# Patient Record
Sex: Male | Born: 1935 | Race: White | Hispanic: No | Marital: Married | Smoking: Former smoker
Health system: Southern US, Community
[De-identification: ages and names within clinical notes are randomized; demographics above are authoritative.]

## PROBLEM LIST (undated history)

## (undated) DIAGNOSIS — E119 Type 2 diabetes mellitus without complications: Secondary | ICD-10-CM

## (undated) DIAGNOSIS — M4800 Spinal stenosis, site unspecified: Secondary | ICD-10-CM

## (undated) DIAGNOSIS — I10 Essential (primary) hypertension: Secondary | ICD-10-CM

## (undated) DIAGNOSIS — I2510 Atherosclerotic heart disease of native coronary artery without angina pectoris: Secondary | ICD-10-CM

## (undated) DIAGNOSIS — K279 Peptic ulcer, site unspecified, unspecified as acute or chronic, without hemorrhage or perforation: Secondary | ICD-10-CM

## (undated) DIAGNOSIS — E785 Hyperlipidemia, unspecified: Secondary | ICD-10-CM

## (undated) DIAGNOSIS — E669 Obesity, unspecified: Secondary | ICD-10-CM

## (undated) DIAGNOSIS — G4730 Sleep apnea, unspecified: Secondary | ICD-10-CM

## (undated) DIAGNOSIS — H3530 Unspecified macular degeneration: Secondary | ICD-10-CM

## (undated) DIAGNOSIS — I480 Paroxysmal atrial fibrillation: Secondary | ICD-10-CM

## (undated) HISTORY — DX: Paroxysmal atrial fibrillation: I48.0

## (undated) HISTORY — DX: Atherosclerotic heart disease of native coronary artery without angina pectoris: I25.10

## (undated) HISTORY — DX: Spinal stenosis, site unspecified: M48.00

## (undated) HISTORY — PX: CARDIAC CATHETERIZATION: SHX172

## (undated) HISTORY — DX: Essential (primary) hypertension: I10

## (undated) HISTORY — PX: TURP VAPORIZATION: SUR1397

## (undated) HISTORY — DX: Hyperlipidemia, unspecified: E78.5

## (undated) HISTORY — DX: Sleep apnea, unspecified: G47.30

## (undated) HISTORY — DX: Type 2 diabetes mellitus without complications: E11.9

## (undated) HISTORY — PX: OTHER SURGICAL HISTORY: SHX169

## (undated) HISTORY — DX: Peptic ulcer, site unspecified, unspecified as acute or chronic, without hemorrhage or perforation: K27.9

## (undated) HISTORY — DX: Unspecified macular degeneration: H35.30

## (undated) HISTORY — DX: Obesity, unspecified: E66.9

---

## 1999-09-27 ENCOUNTER — Ambulatory Visit (HOSPITAL_BASED_OUTPATIENT_CLINIC_OR_DEPARTMENT_OTHER): Admission: RE | Admit: 1999-09-27 | Discharge: 1999-09-27 | Payer: Self-pay | Admitting: Geriatric Medicine

## 2000-04-27 ENCOUNTER — Inpatient Hospital Stay (HOSPITAL_COMMUNITY): Admission: EM | Admit: 2000-04-27 | Discharge: 2000-04-28 | Payer: Self-pay | Admitting: Emergency Medicine

## 2000-05-22 ENCOUNTER — Ambulatory Visit (HOSPITAL_COMMUNITY): Admission: RE | Admit: 2000-05-22 | Discharge: 2000-05-23 | Payer: Self-pay | Admitting: Cardiology

## 2000-08-27 ENCOUNTER — Encounter: Admission: RE | Admit: 2000-08-27 | Discharge: 2000-08-27 | Payer: Self-pay | Admitting: Geriatric Medicine

## 2000-08-27 ENCOUNTER — Encounter: Payer: Self-pay | Admitting: Geriatric Medicine

## 2000-10-02 ENCOUNTER — Ambulatory Visit (HOSPITAL_COMMUNITY): Admission: RE | Admit: 2000-10-02 | Discharge: 2000-10-02 | Payer: Self-pay | Admitting: *Deleted

## 2000-10-02 ENCOUNTER — Encounter (INDEPENDENT_AMBULATORY_CARE_PROVIDER_SITE_OTHER): Payer: Self-pay | Admitting: Specialist

## 2000-10-28 ENCOUNTER — Encounter: Payer: Self-pay | Admitting: Orthopaedic Surgery

## 2000-10-28 ENCOUNTER — Encounter: Admission: RE | Admit: 2000-10-28 | Discharge: 2000-10-28 | Payer: Self-pay | Admitting: Orthopaedic Surgery

## 2000-11-12 ENCOUNTER — Encounter: Admission: RE | Admit: 2000-11-12 | Discharge: 2000-12-03 | Payer: Self-pay | Admitting: Orthopaedic Surgery

## 2000-11-20 ENCOUNTER — Ambulatory Visit (HOSPITAL_COMMUNITY): Admission: RE | Admit: 2000-11-20 | Discharge: 2000-11-20 | Payer: Self-pay | Admitting: Orthopaedic Surgery

## 2000-11-20 ENCOUNTER — Encounter: Payer: Self-pay | Admitting: Orthopaedic Surgery

## 2000-11-20 ENCOUNTER — Encounter: Admission: RE | Admit: 2000-11-20 | Discharge: 2000-11-20 | Payer: Self-pay | Admitting: Orthopaedic Surgery

## 2000-12-04 ENCOUNTER — Encounter: Admission: RE | Admit: 2000-12-04 | Discharge: 2000-12-04 | Payer: Self-pay | Admitting: Orthopaedic Surgery

## 2000-12-04 ENCOUNTER — Ambulatory Visit (HOSPITAL_COMMUNITY): Admission: RE | Admit: 2000-12-04 | Discharge: 2000-12-04 | Payer: Self-pay | Admitting: Orthopaedic Surgery

## 2000-12-04 ENCOUNTER — Encounter: Payer: Self-pay | Admitting: Orthopaedic Surgery

## 2000-12-18 ENCOUNTER — Ambulatory Visit (HOSPITAL_COMMUNITY): Admission: RE | Admit: 2000-12-18 | Discharge: 2000-12-18 | Payer: Self-pay | Admitting: Orthopaedic Surgery

## 2000-12-18 ENCOUNTER — Encounter: Admission: RE | Admit: 2000-12-18 | Discharge: 2000-12-18 | Payer: Self-pay | Admitting: Orthopaedic Surgery

## 2000-12-18 ENCOUNTER — Encounter: Payer: Self-pay | Admitting: Orthopaedic Surgery

## 2002-02-25 ENCOUNTER — Ambulatory Visit (HOSPITAL_COMMUNITY): Admission: RE | Admit: 2002-02-25 | Discharge: 2002-02-25 | Payer: Self-pay | Admitting: Cardiology

## 2002-03-01 ENCOUNTER — Encounter: Admission: RE | Admit: 2002-03-01 | Discharge: 2002-03-01 | Payer: Self-pay | Admitting: Orthopedic Surgery

## 2002-03-02 ENCOUNTER — Ambulatory Visit (HOSPITAL_BASED_OUTPATIENT_CLINIC_OR_DEPARTMENT_OTHER): Admission: RE | Admit: 2002-03-02 | Discharge: 2002-03-02 | Payer: Self-pay | Admitting: Orthopedic Surgery

## 2002-03-25 ENCOUNTER — Ambulatory Visit (HOSPITAL_BASED_OUTPATIENT_CLINIC_OR_DEPARTMENT_OTHER): Admission: RE | Admit: 2002-03-25 | Discharge: 2002-03-25 | Payer: Self-pay | Admitting: Orthopedic Surgery

## 2003-03-22 ENCOUNTER — Encounter: Admission: RE | Admit: 2003-03-22 | Discharge: 2003-03-22 | Payer: Self-pay | Admitting: Geriatric Medicine

## 2003-03-29 ENCOUNTER — Ambulatory Visit (HOSPITAL_COMMUNITY): Admission: RE | Admit: 2003-03-29 | Discharge: 2003-03-29 | Payer: Self-pay | Admitting: Geriatric Medicine

## 2003-04-18 ENCOUNTER — Encounter: Admission: RE | Admit: 2003-04-18 | Discharge: 2003-04-18 | Payer: Self-pay | Admitting: Otolaryngology

## 2003-04-19 ENCOUNTER — Encounter (INDEPENDENT_AMBULATORY_CARE_PROVIDER_SITE_OTHER): Payer: Self-pay | Admitting: Specialist

## 2003-04-19 ENCOUNTER — Ambulatory Visit (HOSPITAL_COMMUNITY): Admission: RE | Admit: 2003-04-19 | Discharge: 2003-04-20 | Payer: Self-pay | Admitting: Otolaryngology

## 2003-05-25 ENCOUNTER — Inpatient Hospital Stay (HOSPITAL_COMMUNITY): Admission: EM | Admit: 2003-05-25 | Discharge: 2003-05-30 | Payer: Self-pay | Admitting: Emergency Medicine

## 2003-06-01 ENCOUNTER — Ambulatory Visit (HOSPITAL_COMMUNITY): Admission: RE | Admit: 2003-06-01 | Discharge: 2003-06-02 | Payer: Self-pay | Admitting: Cardiology

## 2003-06-09 ENCOUNTER — Inpatient Hospital Stay (HOSPITAL_COMMUNITY): Admission: RE | Admit: 2003-06-09 | Discharge: 2003-06-10 | Payer: Self-pay | Admitting: Cardiology

## 2004-05-01 ENCOUNTER — Ambulatory Visit: Payer: Self-pay | Admitting: Cardiology

## 2004-05-15 ENCOUNTER — Encounter (INDEPENDENT_AMBULATORY_CARE_PROVIDER_SITE_OTHER): Payer: Self-pay | Admitting: Specialist

## 2004-05-15 ENCOUNTER — Ambulatory Visit (HOSPITAL_COMMUNITY): Admission: RE | Admit: 2004-05-15 | Discharge: 2004-05-15 | Payer: Self-pay | Admitting: *Deleted

## 2004-06-11 ENCOUNTER — Ambulatory Visit: Payer: Self-pay | Admitting: Cardiology

## 2004-06-19 ENCOUNTER — Ambulatory Visit: Payer: Self-pay | Admitting: Cardiology

## 2004-07-03 ENCOUNTER — Ambulatory Visit: Payer: Self-pay | Admitting: Cardiology

## 2004-07-17 ENCOUNTER — Ambulatory Visit: Payer: Self-pay | Admitting: Cardiology

## 2004-08-14 ENCOUNTER — Ambulatory Visit: Payer: Self-pay | Admitting: Cardiovascular Disease

## 2004-08-21 ENCOUNTER — Ambulatory Visit: Payer: Self-pay | Admitting: *Deleted

## 2004-09-04 ENCOUNTER — Ambulatory Visit: Payer: Self-pay | Admitting: Cardiology

## 2004-09-18 ENCOUNTER — Ambulatory Visit: Payer: Self-pay | Admitting: Cardiology

## 2004-10-02 ENCOUNTER — Ambulatory Visit: Payer: Self-pay | Admitting: Cardiovascular Disease

## 2004-10-10 ENCOUNTER — Encounter: Admission: RE | Admit: 2004-10-10 | Discharge: 2004-10-10 | Payer: Self-pay | Admitting: Geriatric Medicine

## 2004-10-18 ENCOUNTER — Encounter: Admission: RE | Admit: 2004-10-18 | Discharge: 2004-10-18 | Payer: Self-pay | Admitting: Geriatric Medicine

## 2004-10-30 ENCOUNTER — Ambulatory Visit: Payer: Self-pay | Admitting: Cardiovascular Disease

## 2004-11-26 ENCOUNTER — Encounter: Admission: RE | Admit: 2004-11-26 | Discharge: 2004-11-26 | Payer: Self-pay | Admitting: Geriatric Medicine

## 2004-11-26 ENCOUNTER — Ambulatory Visit: Payer: Self-pay | Admitting: Cardiology

## 2004-12-04 ENCOUNTER — Ambulatory Visit: Payer: Self-pay | Admitting: Cardiology

## 2004-12-11 ENCOUNTER — Ambulatory Visit: Payer: Self-pay | Admitting: Cardiology

## 2005-01-15 ENCOUNTER — Encounter: Admission: RE | Admit: 2005-01-15 | Discharge: 2005-01-15 | Payer: Self-pay | Admitting: Geriatric Medicine

## 2005-01-29 ENCOUNTER — Ambulatory Visit: Payer: Self-pay

## 2005-02-12 ENCOUNTER — Ambulatory Visit: Payer: Self-pay | Admitting: Cardiology

## 2005-03-05 ENCOUNTER — Ambulatory Visit: Payer: Self-pay | Admitting: Cardiology

## 2005-04-02 ENCOUNTER — Ambulatory Visit: Payer: Self-pay | Admitting: Cardiology

## 2005-04-28 ENCOUNTER — Ambulatory Visit: Payer: Self-pay | Admitting: Cardiology

## 2005-04-30 ENCOUNTER — Encounter: Admission: RE | Admit: 2005-04-30 | Discharge: 2005-04-30 | Payer: Self-pay | Admitting: Geriatric Medicine

## 2005-05-07 ENCOUNTER — Ambulatory Visit: Payer: Self-pay | Admitting: Cardiology

## 2005-05-14 ENCOUNTER — Encounter: Admission: RE | Admit: 2005-05-14 | Discharge: 2005-05-14 | Payer: Self-pay | Admitting: Geriatric Medicine

## 2005-05-14 ENCOUNTER — Ambulatory Visit: Payer: Self-pay | Admitting: Cardiology

## 2005-05-22 ENCOUNTER — Ambulatory Visit: Payer: Self-pay | Admitting: Cardiology

## 2005-06-11 ENCOUNTER — Ambulatory Visit: Payer: Self-pay | Admitting: Cardiology

## 2005-07-09 ENCOUNTER — Ambulatory Visit: Payer: Self-pay | Admitting: Internal Medicine

## 2005-07-30 ENCOUNTER — Ambulatory Visit: Payer: Self-pay | Admitting: Internal Medicine

## 2005-08-08 ENCOUNTER — Ambulatory Visit: Payer: Self-pay | Admitting: Cardiology

## 2005-08-15 ENCOUNTER — Ambulatory Visit: Payer: Self-pay | Admitting: Cardiology

## 2005-08-27 ENCOUNTER — Ambulatory Visit: Payer: Self-pay | Admitting: Cardiovascular Disease

## 2005-09-12 ENCOUNTER — Ambulatory Visit: Payer: Self-pay

## 2005-09-26 ENCOUNTER — Ambulatory Visit: Payer: Self-pay | Admitting: Internal Medicine

## 2005-10-17 ENCOUNTER — Ambulatory Visit: Payer: Self-pay | Admitting: Internal Medicine

## 2005-11-13 ENCOUNTER — Ambulatory Visit: Payer: Self-pay | Admitting: Cardiology

## 2005-12-03 ENCOUNTER — Ambulatory Visit: Payer: Self-pay | Admitting: Cardiology

## 2005-12-31 ENCOUNTER — Ambulatory Visit: Payer: Self-pay | Admitting: Cardiology

## 2006-01-14 ENCOUNTER — Ambulatory Visit: Payer: Self-pay | Admitting: Cardiology

## 2006-01-27 ENCOUNTER — Ambulatory Visit: Payer: Self-pay | Admitting: Internal Medicine

## 2006-02-19 ENCOUNTER — Ambulatory Visit: Payer: Self-pay | Admitting: Cardiology

## 2006-02-25 ENCOUNTER — Ambulatory Visit: Payer: Self-pay | Admitting: Cardiology

## 2006-03-12 ENCOUNTER — Ambulatory Visit: Payer: Self-pay | Admitting: Cardiology

## 2006-04-13 ENCOUNTER — Ambulatory Visit: Payer: Self-pay | Admitting: Cardiology

## 2006-05-04 ENCOUNTER — Ambulatory Visit: Payer: Self-pay | Admitting: Cardiovascular Disease

## 2006-05-12 ENCOUNTER — Ambulatory Visit: Payer: Self-pay | Admitting: Cardiology

## 2006-05-18 ENCOUNTER — Ambulatory Visit: Payer: Self-pay | Admitting: Internal Medicine

## 2006-06-15 ENCOUNTER — Ambulatory Visit: Payer: Self-pay | Admitting: Cardiovascular Disease

## 2006-06-29 ENCOUNTER — Ambulatory Visit: Payer: Self-pay | Admitting: Cardiovascular Disease

## 2006-07-06 ENCOUNTER — Encounter: Admission: RE | Admit: 2006-07-06 | Discharge: 2006-07-06 | Payer: Self-pay | Admitting: Geriatric Medicine

## 2006-07-13 ENCOUNTER — Ambulatory Visit: Payer: Self-pay | Admitting: Cardiology

## 2006-08-05 ENCOUNTER — Ambulatory Visit: Payer: Self-pay | Admitting: Cardiology

## 2006-08-11 ENCOUNTER — Ambulatory Visit: Payer: Self-pay | Admitting: Cardiology

## 2006-08-12 ENCOUNTER — Ambulatory Visit: Payer: Self-pay

## 2006-08-12 ENCOUNTER — Ambulatory Visit: Payer: Self-pay | Admitting: Cardiology

## 2006-08-12 LAB — CONVERTED CEMR LAB
BUN: 12 mg/dL (ref 6–23)
Basophils Absolute: 0 10*3/uL (ref 0.0–0.1)
Basophils Relative: 0.5 % (ref 0.0–1.0)
CO2: 29 meq/L (ref 19–32)
Calcium: 9.4 mg/dL (ref 8.4–10.5)
Chloride: 107 meq/L (ref 96–112)
Cholesterol: 152 mg/dL (ref 0–200)
Creatinine, Ser: 1.1 mg/dL (ref 0.4–1.5)
Eosinophils Absolute: 0.2 10*3/uL (ref 0.0–0.6)
Eosinophils Relative: 3.6 % (ref 0.0–5.0)
GFR calc Af Amer: 83 mL/min
GFR calc non Af Amer: 69 mL/min
Glucose, Bld: 148 mg/dL — ABNORMAL HIGH (ref 70–99)
HCT: 44.7 % (ref 39.0–52.0)
HDL: 33.9 mg/dL — ABNORMAL LOW (ref >39.0)
Hemoglobin: 15.1 g/dL (ref 13.0–17.0)
Hgb A1c MFr Bld: 6.6 % — ABNORMAL HIGH (ref 4.6–6.0)
LDL Cholesterol: 78 mg/dL (ref 0–99)
Lymphocytes Relative: 22.8 % (ref 12.0–46.0)
MCHC: 33.8 g/dL (ref 30.0–36.0)
MCV: 89.1 fL (ref 78.0–100.0)
Monocytes Absolute: 0.6 10*3/uL (ref 0.2–0.7)
Monocytes Relative: 8.1 % (ref 3.0–11.0)
Neutro Abs: 4.4 10*3/uL (ref 1.4–7.7)
Neutrophils Relative %: 65 % (ref 43.0–77.0)
Platelets: 182 10*3/uL (ref 150–400)
Potassium: 3.8 meq/L (ref 3.5–5.1)
RBC: 5.02 M/uL (ref 4.22–5.81)
RDW: 14.4 % (ref 11.5–14.6)
Sodium: 143 meq/L (ref 135–145)
Total CHOL/HDL Ratio: 4.5
Triglycerides: 199 mg/dL — ABNORMAL HIGH (ref 0–149)
VLDL: 40 mg/dL (ref 0–40)
WBC: 6.8 10*3/uL (ref 4.5–10.5)

## 2006-08-18 ENCOUNTER — Ambulatory Visit: Payer: Self-pay | Admitting: Cardiology

## 2006-08-18 LAB — CONVERTED CEMR LAB
INR: 7.8 (ref 0.9–2.0)
Prothrombin Time: 36.6 s (ref 10.0–14.0)

## 2006-08-21 ENCOUNTER — Ambulatory Visit: Payer: Self-pay | Admitting: Cardiology

## 2006-08-28 ENCOUNTER — Ambulatory Visit: Payer: Self-pay | Admitting: Cardiovascular Disease

## 2006-08-28 LAB — CONVERTED CEMR LAB
INR: 7 (ref 0.9–2.0)
Prothrombin Time: 34.6 s (ref 10.0–14.0)

## 2006-09-01 ENCOUNTER — Ambulatory Visit: Payer: Self-pay | Admitting: Cardiovascular Disease

## 2006-09-07 ENCOUNTER — Ambulatory Visit: Payer: Self-pay | Admitting: Cardiology

## 2006-09-15 ENCOUNTER — Ambulatory Visit: Payer: Self-pay | Admitting: Cardiology

## 2006-09-21 ENCOUNTER — Ambulatory Visit: Payer: Self-pay | Admitting: Internal Medicine

## 2006-10-01 ENCOUNTER — Ambulatory Visit: Payer: Self-pay | Admitting: Internal Medicine

## 2006-10-09 ENCOUNTER — Ambulatory Visit: Payer: Self-pay | Admitting: Cardiology

## 2006-10-30 ENCOUNTER — Ambulatory Visit: Payer: Self-pay | Admitting: Cardiology

## 2006-11-27 ENCOUNTER — Ambulatory Visit: Payer: Self-pay | Admitting: Cardiovascular Disease

## 2006-12-25 ENCOUNTER — Ambulatory Visit: Payer: Self-pay | Admitting: Cardiovascular Disease

## 2007-01-08 ENCOUNTER — Ambulatory Visit: Payer: Self-pay | Admitting: Cardiology

## 2007-01-28 ENCOUNTER — Ambulatory Visit: Payer: Self-pay | Admitting: Cardiology

## 2007-02-18 ENCOUNTER — Ambulatory Visit: Payer: Self-pay | Admitting: Cardiovascular Disease

## 2007-03-18 ENCOUNTER — Ambulatory Visit: Payer: Self-pay | Admitting: Cardiology

## 2007-04-15 ENCOUNTER — Ambulatory Visit: Payer: Self-pay | Admitting: Cardiology

## 2007-05-13 ENCOUNTER — Ambulatory Visit: Payer: Self-pay | Admitting: Cardiovascular Disease

## 2007-06-10 ENCOUNTER — Ambulatory Visit: Payer: Self-pay | Admitting: Cardiology

## 2007-07-08 ENCOUNTER — Ambulatory Visit: Payer: Self-pay | Admitting: Internal Medicine

## 2007-07-22 ENCOUNTER — Ambulatory Visit: Payer: Self-pay | Admitting: Cardiology

## 2007-08-09 ENCOUNTER — Ambulatory Visit: Payer: Self-pay | Admitting: Cardiology

## 2007-08-19 ENCOUNTER — Ambulatory Visit: Payer: Self-pay

## 2007-08-19 ENCOUNTER — Ambulatory Visit: Payer: Self-pay | Admitting: Cardiology

## 2007-09-16 ENCOUNTER — Ambulatory Visit: Payer: Self-pay | Admitting: Cardiology

## 2007-09-30 ENCOUNTER — Ambulatory Visit: Payer: Self-pay | Admitting: Cardiology

## 2007-10-28 ENCOUNTER — Ambulatory Visit: Payer: Self-pay | Admitting: Cardiology

## 2007-11-11 ENCOUNTER — Ambulatory Visit: Payer: Self-pay | Admitting: Cardiology

## 2007-12-09 ENCOUNTER — Ambulatory Visit: Payer: Self-pay | Admitting: Cardiology

## 2007-12-09 LAB — CONVERTED CEMR LAB
ALT: 34 units/L (ref 0–53)
AST: 41 units/L — ABNORMAL HIGH (ref 0–37)
Albumin: 3.8 g/dL (ref 3.5–5.2)
Alkaline Phosphatase: 18 units/L — ABNORMAL LOW (ref 39–117)
Bilirubin, Direct: 0.1 mg/dL (ref 0.0–0.3)
Total Bilirubin: 1 mg/dL (ref 0.3–1.2)
Total Protein: 7.2 g/dL (ref 6.0–8.3)

## 2008-01-06 ENCOUNTER — Ambulatory Visit: Payer: Self-pay | Admitting: Internal Medicine

## 2008-02-04 ENCOUNTER — Ambulatory Visit: Payer: Self-pay | Admitting: Cardiology

## 2008-03-06 ENCOUNTER — Ambulatory Visit: Payer: Self-pay | Admitting: Cardiovascular Disease

## 2008-03-30 ENCOUNTER — Ambulatory Visit: Payer: Self-pay | Admitting: Internal Medicine

## 2008-04-27 ENCOUNTER — Ambulatory Visit: Payer: Self-pay | Admitting: Cardiovascular Disease

## 2008-05-25 ENCOUNTER — Ambulatory Visit: Payer: Self-pay | Admitting: Internal Medicine

## 2008-06-22 ENCOUNTER — Ambulatory Visit: Payer: Self-pay | Admitting: Cardiology

## 2008-07-06 ENCOUNTER — Ambulatory Visit: Payer: Self-pay | Admitting: Cardiology

## 2008-08-03 ENCOUNTER — Ambulatory Visit: Payer: Self-pay | Admitting: Internal Medicine

## 2008-08-03 LAB — CONVERTED CEMR LAB
POC INR: 2.2
Protime: 18.1

## 2008-08-08 ENCOUNTER — Emergency Department (HOSPITAL_COMMUNITY): Admission: EM | Admit: 2008-08-08 | Discharge: 2008-08-08 | Payer: Self-pay | Admitting: Emergency Medicine

## 2008-08-09 ENCOUNTER — Encounter: Payer: Self-pay | Admitting: *Deleted

## 2008-08-14 DIAGNOSIS — E785 Hyperlipidemia, unspecified: Secondary | ICD-10-CM

## 2008-08-14 DIAGNOSIS — M109 Gout, unspecified: Secondary | ICD-10-CM

## 2008-08-14 DIAGNOSIS — M4800 Spinal stenosis, site unspecified: Secondary | ICD-10-CM

## 2008-08-14 DIAGNOSIS — I4891 Unspecified atrial fibrillation: Secondary | ICD-10-CM | POA: Insufficient documentation

## 2008-08-14 DIAGNOSIS — G4730 Sleep apnea, unspecified: Secondary | ICD-10-CM

## 2008-08-14 DIAGNOSIS — E119 Type 2 diabetes mellitus without complications: Secondary | ICD-10-CM | POA: Insufficient documentation

## 2008-08-14 DIAGNOSIS — M810 Age-related osteoporosis without current pathological fracture: Secondary | ICD-10-CM | POA: Insufficient documentation

## 2008-08-14 DIAGNOSIS — H409 Unspecified glaucoma: Secondary | ICD-10-CM | POA: Insufficient documentation

## 2008-08-14 DIAGNOSIS — I2510 Atherosclerotic heart disease of native coronary artery without angina pectoris: Secondary | ICD-10-CM

## 2008-08-14 DIAGNOSIS — K279 Peptic ulcer, site unspecified, unspecified as acute or chronic, without hemorrhage or perforation: Secondary | ICD-10-CM

## 2008-08-14 DIAGNOSIS — H3530 Unspecified macular degeneration: Secondary | ICD-10-CM | POA: Insufficient documentation

## 2008-08-14 DIAGNOSIS — E669 Obesity, unspecified: Secondary | ICD-10-CM | POA: Insufficient documentation

## 2008-08-14 DIAGNOSIS — I10 Essential (primary) hypertension: Secondary | ICD-10-CM

## 2008-08-15 ENCOUNTER — Ambulatory Visit: Payer: Self-pay | Admitting: Cardiology

## 2008-08-16 ENCOUNTER — Encounter: Payer: Self-pay | Admitting: Cardiology

## 2008-08-16 ENCOUNTER — Telehealth: Payer: Self-pay | Admitting: Cardiology

## 2008-08-16 ENCOUNTER — Telehealth (INDEPENDENT_AMBULATORY_CARE_PROVIDER_SITE_OTHER): Payer: Self-pay | Admitting: *Deleted

## 2008-08-17 ENCOUNTER — Telehealth (INDEPENDENT_AMBULATORY_CARE_PROVIDER_SITE_OTHER): Payer: Self-pay | Admitting: *Deleted

## 2008-08-21 ENCOUNTER — Telehealth (INDEPENDENT_AMBULATORY_CARE_PROVIDER_SITE_OTHER): Payer: Self-pay | Admitting: *Deleted

## 2008-08-22 ENCOUNTER — Encounter: Payer: Self-pay | Admitting: Cardiology

## 2008-08-22 ENCOUNTER — Ambulatory Visit: Payer: Self-pay

## 2008-08-31 ENCOUNTER — Inpatient Hospital Stay (HOSPITAL_COMMUNITY): Admission: RE | Admit: 2008-08-31 | Discharge: 2008-09-01 | Payer: Self-pay | Admitting: Orthopedic Surgery

## 2008-09-13 ENCOUNTER — Encounter: Payer: Self-pay | Admitting: *Deleted

## 2008-10-20 ENCOUNTER — Encounter: Payer: Self-pay | Admitting: Cardiology

## 2008-10-23 ENCOUNTER — Encounter (INDEPENDENT_AMBULATORY_CARE_PROVIDER_SITE_OTHER): Payer: Self-pay | Admitting: *Deleted

## 2008-10-25 ENCOUNTER — Ambulatory Visit: Payer: Self-pay | Admitting: Internal Medicine

## 2008-10-25 LAB — CONVERTED CEMR LAB: POC INR: 3.1

## 2008-11-21 ENCOUNTER — Encounter: Payer: Self-pay | Admitting: Cardiology

## 2008-11-22 ENCOUNTER — Ambulatory Visit: Payer: Self-pay | Admitting: Cardiology

## 2008-11-22 LAB — CONVERTED CEMR LAB: POC INR: 3.7

## 2008-12-06 ENCOUNTER — Ambulatory Visit: Payer: Self-pay | Admitting: Cardiology

## 2008-12-06 ENCOUNTER — Telehealth (INDEPENDENT_AMBULATORY_CARE_PROVIDER_SITE_OTHER): Payer: Self-pay | Admitting: *Deleted

## 2008-12-06 LAB — CONVERTED CEMR LAB: POC INR: 3.3

## 2008-12-20 ENCOUNTER — Ambulatory Visit: Payer: Self-pay | Admitting: Cardiology

## 2008-12-20 LAB — CONVERTED CEMR LAB: POC INR: 2.6

## 2009-01-10 ENCOUNTER — Ambulatory Visit: Payer: Self-pay | Admitting: Cardiovascular Disease

## 2009-01-10 LAB — CONVERTED CEMR LAB: POC INR: 2.3

## 2009-02-07 ENCOUNTER — Ambulatory Visit: Payer: Self-pay | Admitting: Cardiology

## 2009-02-07 ENCOUNTER — Encounter (INDEPENDENT_AMBULATORY_CARE_PROVIDER_SITE_OTHER): Payer: Self-pay | Admitting: Cardiology

## 2009-02-07 LAB — CONVERTED CEMR LAB: POC INR: 1.7

## 2009-02-14 ENCOUNTER — Ambulatory Visit: Payer: Self-pay | Admitting: Cardiology

## 2009-02-14 ENCOUNTER — Encounter (INDEPENDENT_AMBULATORY_CARE_PROVIDER_SITE_OTHER): Payer: Self-pay | Admitting: Cardiology

## 2009-02-14 LAB — CONVERTED CEMR LAB: POC INR: 1.9

## 2009-02-28 ENCOUNTER — Ambulatory Visit: Payer: Self-pay | Admitting: Cardiology

## 2009-02-28 LAB — CONVERTED CEMR LAB: POC INR: 2.2

## 2009-03-21 ENCOUNTER — Ambulatory Visit: Payer: Self-pay | Admitting: Internal Medicine

## 2009-03-21 LAB — CONVERTED CEMR LAB: POC INR: 2

## 2009-04-11 ENCOUNTER — Ambulatory Visit: Payer: Self-pay | Admitting: Cardiovascular Disease

## 2009-04-11 LAB — CONVERTED CEMR LAB: POC INR: 1.7

## 2009-05-02 ENCOUNTER — Ambulatory Visit: Payer: Self-pay | Admitting: Cardiology

## 2009-05-23 ENCOUNTER — Telehealth: Payer: Self-pay | Admitting: Cardiology

## 2009-05-23 ENCOUNTER — Ambulatory Visit: Payer: Self-pay | Admitting: Cardiology

## 2009-05-23 LAB — CONVERTED CEMR LAB: POC INR: 2.4

## 2009-05-28 ENCOUNTER — Encounter: Payer: Self-pay | Admitting: Cardiology

## 2009-06-06 ENCOUNTER — Encounter: Payer: Self-pay | Admitting: Cardiology

## 2009-06-15 ENCOUNTER — Telehealth (INDEPENDENT_AMBULATORY_CARE_PROVIDER_SITE_OTHER): Payer: Self-pay | Admitting: *Deleted

## 2009-06-20 ENCOUNTER — Ambulatory Visit: Payer: Self-pay | Admitting: Cardiovascular Disease

## 2009-06-20 LAB — CONVERTED CEMR LAB: POC INR: 1.7

## 2009-07-11 ENCOUNTER — Ambulatory Visit: Payer: Self-pay | Admitting: Cardiovascular Disease

## 2009-07-11 LAB — CONVERTED CEMR LAB: POC INR: 2.6

## 2009-08-08 ENCOUNTER — Ambulatory Visit: Payer: Self-pay | Admitting: Cardiology

## 2009-08-08 LAB — CONVERTED CEMR LAB: POC INR: 2.2

## 2009-08-15 ENCOUNTER — Ambulatory Visit: Payer: Self-pay | Admitting: Cardiology

## 2009-08-15 DIAGNOSIS — I739 Peripheral vascular disease, unspecified: Secondary | ICD-10-CM

## 2009-08-27 ENCOUNTER — Ambulatory Visit: Payer: Self-pay

## 2009-08-27 ENCOUNTER — Encounter: Payer: Self-pay | Admitting: Cardiology

## 2009-09-05 ENCOUNTER — Ambulatory Visit: Payer: Self-pay | Admitting: Internal Medicine

## 2009-09-05 LAB — CONVERTED CEMR LAB: POC INR: 3.1

## 2009-09-24 ENCOUNTER — Telehealth: Payer: Self-pay | Admitting: Cardiology

## 2009-10-03 ENCOUNTER — Ambulatory Visit: Payer: Self-pay | Admitting: Cardiology

## 2009-10-03 LAB — CONVERTED CEMR LAB: POC INR: 4.6

## 2009-10-17 ENCOUNTER — Ambulatory Visit: Payer: Self-pay | Admitting: Cardiology

## 2009-10-17 LAB — CONVERTED CEMR LAB: POC INR: 2.5

## 2009-11-09 ENCOUNTER — Telehealth: Payer: Self-pay | Admitting: Cardiology

## 2009-11-09 ENCOUNTER — Ambulatory Visit: Payer: Self-pay | Admitting: Internal Medicine

## 2009-12-07 ENCOUNTER — Ambulatory Visit: Payer: Self-pay | Admitting: Internal Medicine

## 2009-12-07 LAB — CONVERTED CEMR LAB: POC INR: 1.9

## 2010-01-04 ENCOUNTER — Ambulatory Visit: Payer: Self-pay | Admitting: Internal Medicine

## 2010-01-25 ENCOUNTER — Ambulatory Visit: Payer: Self-pay | Admitting: Cardiovascular Disease

## 2010-02-15 ENCOUNTER — Ambulatory Visit: Payer: Self-pay | Admitting: Internal Medicine

## 2010-03-15 ENCOUNTER — Ambulatory Visit: Admission: RE | Admit: 2010-03-15 | Discharge: 2010-03-15 | Payer: Self-pay | Source: Home / Self Care

## 2010-03-15 LAB — CONVERTED CEMR LAB: INR: 2.2

## 2010-04-09 NOTE — Progress Notes (Signed)
Summary: Amlodipine refill  Phone Note Call from Patient   Caller: Patient Call For: Brodie Summary of Call: Pt seen in [facility] today.  Pt states he needs refill on Amlodipine 10mg  currently taking 1/2 tablet daily.  Needs sent to [facility] on [address].  Initial call taken by: Cloyde Reams RN,  November 09, 2009 8:38 AM     Appended Document: Amlodipine refill    Prescriptions: NORVASC 10 MG TABS (AMLODIPINE BESYLATE) Take 1 tablet by mouth once a day  #90 x 3   Entered by:   Burnett Kanaris, CNA   Authorized by:   Lenoria Farrier, MD, [facility]   Signed by:   Burnett Kanaris, CNA on 11/13/2009   Method used:   Faxed to ...       [facility] (retail)       [phone] [address]       [place], [place]  [address]       Ph: [phone]       Fax: [phone]   RxID:   [id number] NORVASC 10 MG TABS (AMLODIPINE BESYLATE) Take 1 tablet by mouth once a day  #90 x 3   Entered by:   Burnett Kanaris, CNA   Authorized by:   Lenoria Farrier, MD, [facility]   Signed by:   Burnett Kanaris, CNA on 11/13/2009   Method used:   Print then Give to Patient   RxID:   [id number]

## 2010-04-09 NOTE — Medication Information (Signed)
Summary: rov/ewj  Anticoagulant Therapy  Managed by: Cloyde Reams, RN, BSN Referring MD: Charlies Constable MD PCP: [name] Supervising MD: Eden Emms MD, Theron Arista Indication 1: Atrial Fibrillation (ICD-427.31) Lab Used: Primary school teacher Site: [address] INR POC 1.7 INR RANGE 2 - 3  Dietary changes: no    Health status changes: no    Bleeding/hemorrhagic complications: no    Recent/future hospitalizations: no    Any changes in medication regimen? no    Recent/future dental: no  Any missed doses?: yes     Details: Off coumadin on 3/28 x 5 days, resumed on 06/09/09.  Is patient compliant with meds? yes       Allergies: 1)  ! * Contrast Dye  Anticoagulation Management History:      The patient is taking warfarin and comes in today for a routine follow up visit.  Positive risk factors for bleeding include an age of 75 years or older and presence of serious comorbidities.  The bleeding index is 'intermediate risk'.  Positive CHADS2 values include History of HTN, Age > 20 years old, and History of Diabetes.  The start date was 04/28/2000.  His last INR was 7.0 RATIO.  Anticoagulation responsible provider: Eden Emms MD, Theron Arista.  INR POC: 1.7.  Cuvette Lot#: [id number].  Exp: 07/2010.    Anticoagulation Management Assessment/Plan:      The patient's current anticoagulation dose is Warfarin sodium 2.5 mg tabs: Take by mouth as directed.  The target INR is 2 - 3.  The next INR is due 07/11/2009.  Anticoagulation instructions were given to patient.  Results were reviewed/authorized by Cloyde Reams, RN, BSN.  He was notified by Cloyde Reams RN.         Prior Anticoagulation Instructions: INR 2.4  Continue on same dosage 2.5mg  daily.  Recheck in 4 weeks.    Current Anticoagulation Instructions: INR 1.7  Take 5mg  today, then resume same dosage 2.5mg  daily.  Recheck in 2-3 weeks.

## 2010-04-09 NOTE — Medication Information (Signed)
Summary: rov/tm  Anticoagulant Therapy  Managed by: Weston Brass, PharmD Referring MD: Charlies Constable MD PCP: [name] Supervising MD: Shirlee Latch MD, Dalton Indication 1: Atrial Fibrillation (ICD-427.31) Lab Used: Primary school teacher Site: [address] INR POC 4.6 INR RANGE 2 - 3  Dietary changes: yes       Details: less greens over past week   Health status changes: no    Bleeding/hemorrhagic complications: no    Recent/future hospitalizations: no    Any changes in medication regimen? no    Recent/future dental: no  Any missed doses?: no       Is patient compliant with meds? yes       Allergies: 1)  ! * Contrast Dye  Anticoagulation Management History:      The patient is taking warfarin and comes in today for a routine follow up visit.  Positive risk factors for bleeding include an age of 75 years or older and presence of serious comorbidities.  The bleeding index is 'intermediate risk'.  Positive CHADS2 values include History of HTN, Age > 75 years old, and History of Diabetes.  The start date was 04/28/2000.  His last INR was 7.0 RATIO.  Anticoagulation responsible provider: Shirlee Latch MD, Dalton.  INR POC: 4.6.  Cuvette Lot#: [id number].  Exp: 12/2010.    Anticoagulation Management Assessment/Plan:      The patient's current anticoagulation dose is Warfarin sodium 2.5 mg tabs: Take by mouth as directed.  The target INR is 2 - 3.  The next INR is due 10/17/2009.  Anticoagulation instructions were given to patient.  Results were reviewed/authorized by Weston Brass, PharmD.  He was notified by Weston Brass PharmD.         Prior Anticoagulation Instructions: INR 3.1 Skip Thursday's dose then resume 2.5mg s everyday. Recheck in 4 weeks.   Current Anticoagulation Instructions: INR- 4.6  Skip Thursday and Friday's dose of Coumadin then resume same dose of 1 tablet every day.

## 2010-04-09 NOTE — Medication Information (Signed)
Summary: rov/sp  Anticoagulant Therapy  Managed by: Reina Fuse, PharmD Referring MD: Charlies Constable MD PCP: [name] Supervising MD: Gala Romney MD, Reuel Boom Indication 1: Atrial Fibrillation (ICD-427.31) Lab Used: Primary school teacher Site: [address] INR POC 1.8 INR RANGE 2 - 3  Dietary changes: no    Health status changes: no    Bleeding/hemorrhagic complications: no    Recent/future hospitalizations: no    Any changes in medication regimen? no    Recent/future dental: no  Any missed doses?: no       Is patient compliant with meds? yes       Allergies: 1)  ! * Contrast Dye  Anticoagulation Management History:      The patient is taking warfarin and comes in today for a routine follow up visit.  Positive risk factors for bleeding include an age of 75 years or older and presence of serious comorbidities.  The bleeding index is 'intermediate risk'.  Positive CHADS2 values include History of HTN, Age > 8 years old, and History of Diabetes.  The start date was 04/28/2000.  His last INR was 7.0 RATIO.  Anticoagulation responsible provider: Bensimhon MD, Reuel Boom.  INR POC: 1.8.  Cuvette Lot#: [id number].  Exp: 01/2011.    Anticoagulation Management Assessment/Plan:      The patient's current anticoagulation dose is Warfarin sodium 2.5 mg tabs: Take by mouth as directed.  The target INR is 2 - 3.  The next INR is due 01/25/2010.  Anticoagulation instructions were given to patient.  Results were reviewed/authorized by Reina Fuse, PharmD.  He was notified by Reina Fuse PharmD.         Prior Anticoagulation Instructions: INR 1.9  Take an extra 1/2 tablet today then resume same dose of 1 tablet every day.  Recheck INR in 4 weeks.   Current Anticoagulation Instructions: INR 1.8  Take Coumadin 1 tab (2.5 mg) all days except Coumadin 1.5 tabs (3.75 mg) on Saturdays. Return to clinic in 3 weeks.

## 2010-04-09 NOTE — Medication Information (Signed)
Summary: rov/tm  Anticoagulant Therapy  Managed by: Weston Brass, PharmD, BCPS Referring MD: Charlies Constable MD PCP: [name] Supervising MD: Tenny Craw MD, Gunnar Fusi Indication 1: Atrial Fibrillation (ICD-427.31) Lab Used: Primary school teacher Site: [address] INR POC 2.8 INR RANGE 2 - 3  Dietary changes: no    Health status changes: no    Bleeding/hemorrhagic complications: no    Recent/future hospitalizations: no    Any changes in medication regimen? yes       Details: has been out of amlodipine for 3 days  Recent/future dental: yes     Details: Middle of october  Any missed doses?: no       Is patient compliant with meds? yes       Allergies (verified): 1)  ! * Contrast Dye  Anticoagulation Management History:      Positive risk factors for bleeding include an age of 27 years or older and presence of serious comorbidities.  The bleeding index is 'intermediate risk'.  Positive CHADS2 values include History of HTN, Age > 82 years old, and History of Diabetes.  The start date was 04/28/2000.  His last INR was 7.0 RATIO.  Anticoagulation responsible provider: Tenny Craw MD, Gunnar Fusi.  INR POC: 2.8.  Exp: 12/2010.    Anticoagulation Management Assessment/Plan:      The patient's current anticoagulation dose is Warfarin sodium 2.5 mg tabs: Take by mouth as directed.  The target INR is 2 - 3.  The next INR is due 12/07/2009.  Anticoagulation instructions were given to patient.  Results were reviewed/authorized by Weston Brass, PharmD, BCPS.  He was notified by Kennieth Francois.         Prior Anticoagulation Instructions: INR 2.5 Continue 2.5mg s everyday. Recheck in 3 weeks.   Current Anticoagulation Instructions: INR 2.8   Continue taking one tablet every day.  We will see you in four weeks to recheck your INR.

## 2010-04-09 NOTE — Medication Information (Signed)
Summary: rov/ewj  Anticoagulant Therapy  Managed by: Bethena Midget, RN, BSN Referring MD: Charlies Constable MD PCP: [name] Supervising MD: Eden Emms MD, Theron Arista Indication 1: Atrial Fibrillation (ICD-427.31) Lab Used: Primary school teacher Site: [address] INR POC 2.6 INR RANGE 2 - 3  Dietary changes: no    Health status changes: no    Bleeding/hemorrhagic complications: no    Recent/future hospitalizations: no    Any changes in medication regimen? no    Recent/future dental: no  Any missed doses?: no       Is patient compliant with meds? yes       Current Medications (verified): 1)  Metformin Hcl 500 Mg Tabs (Metformin Hcl) .... 2 Tabs By Mouth Two Times A Day With Meals. 2)  Gemfibrozil 600 Mg Tabs (Gemfibrozil) .... Take One Tablet By Mouth Twice A Day With Meals 3)  Atenolol 100 Mg Tabs (Atenolol) .... Take One Tablet By Mouth Daily 4)  Lisinopril 40 Mg Tabs (Lisinopril) .... Take One Tablet By Mouth Two Times A Day 5)  Indapamide 2.5 Mg Tabs (Indapamide) .... 2 Tabs Daily in The Morning. 6)  Citalopram Hydrobromide 40 Mg Tabs (Citalopram Hydrobromide) .... 1.5 Tabs By Mouth Two Times A Day. 7)  Albuterol Sulfate (5 Mg/ml) 0.5% Nebu (Albuterol Sulfate) .... As Needed 8)  Aspirin 81 Mg Tbec (Aspirin) .... Take One Tablet By Mouth Daily 9)  Norvasc 10 Mg Tabs (Amlodipine Besylate) .... Take 1 Tablet By Mouth Once A Day 10)  Warfarin Sodium 2.5 Mg Tabs (Warfarin Sodium) .... Take By Mouth As Directed 11)  Nitrostat 0.4 Mg Subl (Nitroglycerin) .Marland Kitchen.. 1 Tablet Under Tongue At Onset of Chest Pain; You May Repeat Every 5 Minutes For Up To 3 Doses. 12)  Preservision/lutein  Caps (Multiple Vitamins-Minerals) .Marland Kitchen.. 1 By Mouth Two Times A Day 13)  Glipizide 10 Mg Tabs (Glipizide) .Marland Kitchen.. 1 By Mouth Two Times A Day Before Meals For Diabetes. 14)  Oscal 500/200 D-3 500-200 Mg-Unit Tabs (Calcium-Vitamin D) .Marland Kitchen.. 1 By Mouth Two Times A Day With Meals. 15)  Trazodone Hcl 100 Mg Tabs  (Trazodone Hcl) .Marland Kitchen.. 1 By Mouth Daily At Bedtime. 16)  Omeprazole 20 Mg Cpdr (Omeprazole) .Marland Kitchen.. 1 By Mouth Daily As Needed. 17)  Amlodipine Besylate 10 Mg Tabs (Amlodipine Besylate) .... Take One  Tablet By Mouth Daily 18)  Proscar 5 Mg Tabs (Finasteride) .Marland Kitchen.. 1 By Mouth Daily. 19)  Crestor 10 Mg Tabs (Rosuvastatin Calcium) .... Take One Tab Every Other Day and 1/2 Tab On Alternating Days. 20)  Geodon 80 Mg Caps (Ziprasidone Hcl) .Marland Kitchen.. 1 By Mouth At Bedtime. 21)  Furosemide 20 Mg Tabs (Furosemide) .... Take One Tablet By Mouth Daily.  Allergies: 1)  ! * Contrast Dye  Anticoagulation Management History:      The patient is taking warfarin and comes in today for a routine follow up visit.  Positive risk factors for bleeding include an age of 61 years or older and presence of serious comorbidities.  The bleeding index is 'intermediate risk'.  Positive CHADS2 values include History of HTN, Age > 67 years old, and History of Diabetes.  The start date was 04/28/2000.  His last INR was 7.0 RATIO.  Anticoagulation responsible provider: Eden Emms MD, Theron Arista.  INR POC: 2.6.  Cuvette Lot#: [id number].  Exp: 08/2010.    Anticoagulation Management Assessment/Plan:      The patient's current anticoagulation dose is Warfarin sodium 2.5 mg tabs: Take by mouth as directed.  The target INR is 2 -  3.  The next INR is due 08/08/2009.  Anticoagulation instructions were given to patient.  Results were reviewed/authorized by Bethena Midget, RN, BSN.  He was notified by Bethena Midget, RN, BSN.         Prior Anticoagulation Instructions: INR 1.7  Take 5mg  today, then resume same dosage 2.5mg  daily.  Recheck in 2-3 weeks.    Current Anticoagulation Instructions: INR 2.6 Continue 2.5mg s daily.  Prescriptions: WARFARIN SODIUM 2.5 MG TABS (WARFARIN SODIUM) Take by mouth as directed  #90 x 4   Entered by:   Bethena Midget, RN, BSN   Authorized by:   Lenoria Farrier, MD, [facility]   Signed by:   Bethena Midget, RN, BSN on 07/11/2009    Method used:   Electronically to        [facility]* (mail-order)             ,          Ph: [phone]       Fax: [phone]   RxID:   [id number]

## 2010-04-09 NOTE — Medication Information (Signed)
Summary: rov/ewj  Anticoagulant Therapy  Managed by: Eda Keys, PharmD Referring MD: Charlies Constable MD PCP: [name] Supervising MD: Myrtis Ser MD, Tinnie Gens Indication 1: Atrial Fibrillation (ICD-427.31) Lab Used: Primary school teacher Site: [address] INR RANGE 2 - 3  Dietary changes: no    Health status changes: no    Bleeding/hemorrhagic complications: no    Recent/future hospitalizations: no    Any changes in medication regimen? no    Recent/future dental: no  Any missed doses?: no       Is patient compliant with meds? yes       Allergies: 1)  ! * Contrast Dye  Anticoagulation Management History:      The patient is taking warfarin and comes in today for a routine follow up visit.  Positive risk factors for bleeding include an age of 80 years or older and presence of serious comorbidities.  The bleeding index is 'intermediate risk'.  Positive CHADS2 values include History of HTN, Age > 13 years old, and History of Diabetes.  The start date was 04/28/2000.  His last INR was 7.0 RATIO.  Anticoagulation responsible provider: Myrtis Ser MD, Tinnie Gens.  Cuvette Lot#: [id number].  Exp: 06/2010.    Anticoagulation Management Assessment/Plan:      The patient's current anticoagulation dose is Warfarin sodium 2.5 mg tabs: Take by mouth as directed.  The target INR is 2 - 3.  The next INR is due 05/23/2009.  Anticoagulation instructions were given to patient.  Results were reviewed/authorized by Eda Keys, PharmD.  He was notified by Eda Keys.         Prior Anticoagulation Instructions: INR 1.7  Take 1.5 tablets today then start taking 1 tablet daily.  Recheck in 2-3 weeks.    Current Anticoagulation Instructions: INR 1.9  Start NEW dosing schedule of 1 tablet daily, except 1.5 tablets on Wednesday.  Return to clinic in 3 weeks.

## 2010-04-09 NOTE — Medication Information (Signed)
Summary: rov/tm  Anticoagulant Therapy  Managed by: Bethena Midget, RN, BSN Referring MD: Charlies Constable MD PCP: Salome Holmes MD: Myrtis Ser MD, Tinnie Gens Indication 1: Atrial Fibrillation (ICD-427.31) Lab Used: Primary school teacher Site: [address] INR POC 2.2 INR RANGE 2 - 3  Dietary changes: no    Health status changes: no    Bleeding/hemorrhagic complications: yes       Details: Rt nare when rubbing does bleed scant amt.  Recent/future hospitalizations: no    Any changes in medication regimen? no    Recent/future dental: no  Any missed doses?: no       Is patient compliant with meds? yes       Allergies: 1)  ! * Contrast Dye  Anticoagulation Management History:      The patient is taking warfarin and comes in today for a routine follow up visit.  Positive risk factors for bleeding include an age of 75 years or older and presence of serious comorbidities.  The bleeding index is 'intermediate risk'.  Positive CHADS2 values include History of HTN, Age > 50 years old, and History of Diabetes.  The start date was 04/28/2000.  His last INR was 7.0 RATIO.  Anticoagulation responsible provider: Myrtis Ser MD, Tinnie Gens.  INR POC: 2.2.  Cuvette Lot#: [id number].  Exp: 10/2010.    Anticoagulation Management Assessment/Plan:      The patient's current anticoagulation dose is Warfarin sodium 2.5 mg tabs: Take by mouth as directed.  The target INR is 2 - 3.  The next INR is due 09/05/2009.  Anticoagulation instructions were given to patient.  Results were reviewed/authorized by Bethena Midget, RN, BSN.  He was notified by Bethena Midget, RN, BSN.         Prior Anticoagulation Instructions: INR 2.6 Continue 2.5mg s daily.   Current Anticoagulation Instructions: INR 2.2 Continue 2.5mg s everyday. Recheck in 4 weeks.

## 2010-04-09 NOTE — Medication Information (Signed)
Summary: rov/tm  Anticoagulant Therapy  Managed by: Bethena Midget, RN, BSN Referring MD: Charlies Constable MD PCP: [name] Supervising MD: Gala Romney MD, Daniel Indication 1: Atrial Fibrillation (ICD-427.31) Lab Used: Primary school teacher Site: [address] INR POC 3.1 INR RANGE 2 - 3  Dietary changes: no    Health status changes: no    Bleeding/hemorrhagic complications: no    Recent/future hospitalizations: no    Any changes in medication regimen? no    Recent/future dental: no  Any missed doses?: no       Is patient compliant with meds? yes       Allergies: 1)  ! * Contrast Dye  Anticoagulation Management History:      The patient is taking warfarin and comes in today for a routine follow up visit.  Positive risk factors for bleeding include an age of 75 years or older and presence of serious comorbidities.  The bleeding index is 'intermediate risk'.  Positive CHADS2 values include History of HTN, Age > 62 years old, and History of Diabetes.  The start date was 04/28/2000.  His last INR was 7.0 RATIO.  Anticoagulation responsible provider: Bensimhon MD, Reuel Boom.  INR POC: 3.1.  Cuvette Lot#: [id number].  Exp: 11/2010.    Anticoagulation Management Assessment/Plan:      The patient's current anticoagulation dose is Warfarin sodium 2.5 mg tabs: Take by mouth as directed.  The target INR is 2 - 3.  The next INR is due 10/03/2009.  Anticoagulation instructions were given to patient.  Results were reviewed/authorized by Bethena Midget, RN, BSN.  He was notified by Bethena Midget, RN, BSN.         Prior Anticoagulation Instructions: INR 2.2 Continue 2.5mg s everyday. Recheck in 4 weeks.   Current Anticoagulation Instructions: INR 3.1 Skip Thursday's dose then resume 2.5mg s everyday. Recheck in 4 weeks.

## 2010-04-09 NOTE — Medication Information (Signed)
Summary: rov/sl  Anticoagulant Therapy  Managed by: Weston Brass, PharmD Referring MD: Charlies Constable MD PCP: [name] Supervising MD: Nahser Indication 1: Atrial Fibrillation (ICD-427.31) Lab Used: Primary school teacher Site: [address] INR POC 2.6 INR RANGE 2 - 3  Dietary changes: no    Health status changes: yes       Details: Sprain arm on 11/05   Bleeding/hemorrhagic complications: yes       Details: Nose bleed on 10/29, took  ~5 mins to stop bleeding  Recent/future hospitalizations: no    Any changes in medication regimen? yes       Details: Started Tylenol #3 on 11/05 and currenltly still taking  Recent/future dental: no  Any missed doses?: yes     Details: Pt had not increased dose since he developed a nose bleed on the day he was suppose to increase dose  Is patient compliant with meds? yes      Comments: Pt never increased to 1.5 on Saturday b/c he had a nose bleed on the first day he was to increase. So he has just been taking 1 tablet everyday  Allergies: 1)  ! * Contrast Dye  Anticoagulation Management History:      The patient is taking warfarin and comes in today for a routine follow up visit.  Positive risk factors for bleeding include an age of 40 years or older and presence of serious comorbidities.  The bleeding index is 'intermediate risk'.  Positive CHADS2 values include History of HTN, Age > 77 years old, and History of Diabetes.  The start date was 04/28/2000.  His last INR was 7.0 RATIO.  Anticoagulation responsible provider: Nahser.  INR POC: 2.6.  Cuvette Lot#: [id number].  Exp: 01/2011.    Anticoagulation Management Assessment/Plan:      The patient's current anticoagulation dose is Warfarin sodium 2.5 mg tabs: Take by mouth as directed.  The target INR is 2 - 3.  The next INR is due 02/15/2010.  Anticoagulation instructions were given to patient.  Results were reviewed/authorized by Weston Brass, PharmD.  He was notified by Hoy Register, PharmD  Candidate.         Prior Anticoagulation Instructions: INR 1.8  Take Coumadin 1 tab (2.5 mg) all days except Coumadin 1.5 tabs (3.75 mg) on Saturdays. Return to clinic in 3 weeks.   Current Anticoagulation Instructions: INR 2.6 Continue previous dose of 1 tablet everyday Recheck INR in 3 weeks

## 2010-04-09 NOTE — Progress Notes (Signed)
Summary: surg. clear (discuss w/ BB)  Phone Note Call from Patient Call back at Home Phone [phone] Call back at [phone] CELL   Caller: Patient Reason for Call: Talk to Nurse Summary of Call: Pt is schedule to have a colon scope on 3/31.  Need to get clearence to have procedure with Dr. Bosie Clos per his office.  Initial call taken by: Omar Person,  May 23, 2009 9:33 AM  Follow-up for Phone Call        I have reviewed the pt's chart. He was last seen on 08/15/08 with Dr. Juanda Chance and had a myoview on 08/22/08. At that time he was given clearance for shoulder surgery and to hold coumadin 5 days prior to the procedure. Dr. Marge Duncans office is requesting the ok to hold the pt's Coumadin 5 days prior to his procedure on 06/06/09 and need to know how long the pt may be off coumadin after surgery if polyps are removed. The pt is on coumadin for a-fib. I will review with Dr. Juanda Chance. I have made the pt aware. I have also called and spoken with Dawn at Dr. Marge Duncans office. She will leave a message for the nurse stating I have recieved their fax and am waiting on a response from Dr. Juanda Chance. Follow-up by: Sherri Rad, RN, BSN,  May 23, 2009 9:55 AM  Additional Follow-up for Phone Call Additional follow up Details #1::        Ok to hold coumadin 5 days before colonoscopy and longer if needed. BB Additional Follow-up by: Lenoria Farrier, MD, [facility],  May 24, 2009 10:44 PM     Appended Document: surg. clear (discuss w/ BB) I faxed the form we had received from [facility] back to them stating Dr. Regino Schultze recommendations.

## 2010-04-09 NOTE — Letter (Signed)
SummaryDeboraha Carroll Physician Surgical Clearance   [facility] Physician Surgical Clearance   Imported By: Roderic Ovens 06/14/2009 13:24:47  _____________________________________________________________________  External Attachment:    Type:   Image     Comment:   External Document

## 2010-04-09 NOTE — Medication Information (Signed)
Summary: rov/tm  Anticoagulant Therapy  Managed by: Cloyde Reams, RN, BSN Referring MD: Charlies Constable MD PCP: [name] Supervising MD: Eden Emms MD, Theron Arista Indication 1: Atrial Fibrillation (ICD-427.31) Lab Used: Primary school teacher Site: [address] INR POC 1.7 INR RANGE 2 - 3  Dietary changes: no    Health status changes: no    Bleeding/hemorrhagic complications: no    Recent/future hospitalizations: no    Any changes in medication regimen? no    Recent/future dental: no  Any missed doses?: no       Is patient compliant with meds? yes       Allergies (verified): 1)  ! * Contrast Dye  Anticoagulation Management History:      The patient is taking warfarin and comes in today for a routine follow up visit.  Positive risk factors for bleeding include an age of 75 years or older and presence of serious comorbidities.  The bleeding index is 'intermediate risk'.  Positive CHADS2 values include History of HTN, Age > 38 years old, and History of Diabetes.  The start date was 04/28/2000.  His last INR was 7.0 RATIO.  Anticoagulation responsible provider: Eden Emms MD, Theron Arista.  INR POC: 1.7.  Cuvette Lot#: [id number].  Exp: 06/2010.    Anticoagulation Management Assessment/Plan:      The patient's current anticoagulation dose is Warfarin sodium 2.5 mg tabs: Take by mouth as directed.  The target INR is 2 - 3.  The next INR is due 05/02/2009.  Anticoagulation instructions were given to patient.  Results were reviewed/authorized by Cloyde Reams, RN, BSN.  He was notified by Cloyde Reams RN.         Prior Anticoagulation Instructions: INR 2.0 Continue 1 tablet daily except 1/2 tablet on Mondays. Recheck in 3 weeks.   Current Anticoagulation Instructions: INR 1.7  Take 1.5 tablets today then start taking 1 tablet daily.  Recheck in 2-3 weeks.

## 2010-04-09 NOTE — Procedures (Signed)
Summary: Colonoscopy Report   Colonoscopy Report   Imported By: Roderic Ovens 07/10/2009 11:19:28  _____________________________________________________________________  External Attachment:    Type:   Image     Comment:   External Document

## 2010-04-09 NOTE — Medication Information (Signed)
Summary: rov/TP  Anticoagulant Therapy  Managed by: Geoffry Paradise, PharmD Referring MD: Charlies Constable MD PCP: [name] Supervising MD: Tenny Craw MD, Gunnar Fusi Indication 1: Atrial Fibrillation (ICD-427.31) Lab Used: Primary school teacher Site: [address] INR POC 2.8 INR RANGE 2 - 3  Dietary changes: no    Health status changes: no    Bleeding/hemorrhagic complications: no    Recent/future hospitalizations: no    Any changes in medication regimen? no    Recent/future dental: no  Any missed doses?: no       Is patient compliant with meds? yes       Allergies: 1)  ! * Contrast Dye  Anticoagulation Management History:      Positive risk factors for bleeding include an age of 2 years or older and presence of serious comorbidities.  The bleeding index is 'intermediate risk'.  Positive CHADS2 values include History of HTN, Age > 35 years old, and History of Diabetes.  The start date was 04/28/2000.  His last INR was 7.0 RATIO.  Anticoagulation responsible Yesenia Locurto: Tenny Craw MD, Gunnar Fusi.  INR POC: 2.8.  Cuvette Lot#: [id number].  Exp: 04/2011.    Anticoagulation Management Assessment/Plan:      The patient's current anticoagulation dose is Warfarin sodium 2.5 mg tabs: Take by mouth as directed.  The target INR is 2 - 3.  The next INR is due 03/15/2010.  Anticoagulation instructions were given to patient.  Results were reviewed/authorized by Geoffry Paradise, PharmD.         Prior Anticoagulation Instructions: INR 2.6 Continue previous dose of 1 tablet everyday Recheck INR in 3 weeks   Current Anticoagulation Instructions: INR:  2.8  Your INR is at goal.  Please continue to take your Warfarin at 2.5 mg every day.    Please come back for another INR check in 4 weeks.

## 2010-04-09 NOTE — Medication Information (Signed)
Summary: rov/tm  Anticoagulant Therapy  Managed by: Bethena Midget, RN, BSN Referring MD: Charlies Constable MD PCP: Salome Holmes MD: Eden Emms MD, Theron Arista Indication 1: Atrial Fibrillation (ICD-427.31) Lab Used: Primary school teacher Site: [address] INR POC 2.0 INR RANGE 2 - 3  Dietary changes: yes       Details: Last week maybe a little extra green leafys.   Health status changes: no    Bleeding/hemorrhagic complications: yes       Details: scant amount when he blew his nose stopped quickly.   Recent/future hospitalizations: no    Any changes in medication regimen? yes       Details: hasn't been taking his MVI, vitamin D, potassium or magnesium as usual.   Recent/future dental: no  Any missed doses?: no       Is patient compliant with meds? yes       Current Medications (verified): 1)  Metformin Hcl 500 Mg Tabs (Metformin Hcl) .... 2 Tabs By Mouth Two Times A Day With Meals. 2)  Gemfibrozil 600 Mg Tabs (Gemfibrozil) .... Take One Tablet By Mouth Twice A Day With Meals 3)  Atenolol 100 Mg Tabs (Atenolol) .... Take One Tablet By Mouth Daily 4)  Lisinopril 40 Mg Tabs (Lisinopril) .... Take One Tablet By Mouth Two Times A Day 5)  Indapamide 2.5 Mg Tabs (Indapamide) .... 2 Tabs Daily in The Morning. 6)  Citalopram Hydrobromide 40 Mg Tabs (Citalopram Hydrobromide) .... 1.5 Tabs By Mouth Two Times A Day. 7)  Albuterol Sulfate (5 Mg/ml) 0.5% Nebu (Albuterol Sulfate) .... As Needed 8)  Aspirin 81 Mg Tbec (Aspirin) .... Take One Tablet By Mouth Daily 9)  Norvasc 10 Mg Tabs (Amlodipine Besylate) .... Take 1 Tablet By Mouth Once A Day 10)  Warfarin Sodium 2.5 Mg Tabs (Warfarin Sodium) .... Take By Mouth As Directed 11)  Nitrostat 0.4 Mg Subl (Nitroglycerin) .Marland Kitchen.. 1 Tablet Under Tongue At Onset of Chest Pain; You May Repeat Every 5 Minutes For Up To 3 Doses. 12)  Preservision/lutein  Caps (Multiple Vitamins-Minerals) .Marland Kitchen.. 1 By Mouth Two Times A Day 13)  Glipizide 10 Mg Tabs  (Glipizide) .Marland Kitchen.. 1 By Mouth Two Times A Day Before Meals For Diabetes. 14)  Oscal 500/200 D-3 500-200 Mg-Unit Tabs (Calcium-Vitamin D) .Marland Kitchen.. 1 By Mouth Two Times A Day With Meals. 15)  Trazodone Hcl 100 Mg Tabs (Trazodone Hcl) .Marland Kitchen.. 1 By Mouth Daily At Bedtime. 16)  Omeprazole 20 Mg Cpdr (Omeprazole) .Marland Kitchen.. 1 By Mouth Daily As Needed. 17)  Amlodipine Besylate 10 Mg Tabs (Amlodipine Besylate) .... Take One  Tablet By Mouth Daily 18)  Proscar 5 Mg Tabs (Finasteride) .Marland Kitchen.. 1 By Mouth Daily. 19)  Crestor 10 Mg Tabs (Rosuvastatin Calcium) .... Take One Tab Every Other Day and 1/2 Tab On Alternating Days. 20)  Geodon 80 Mg Caps (Ziprasidone Hcl) .Marland Kitchen.. 1 By Mouth At Bedtime.  Allergies: 1)  ! * Contrast Dye  Anticoagulation Management History:      The patient is taking warfarin and comes in today for a routine follow up visit.  Positive risk factors for bleeding include an age of 75 years or older and presence of serious comorbidities.  The bleeding index is 'intermediate risk'.  Positive CHADS2 values include History of HTN, Age > 50 years old, and History of Diabetes.  The start date was 04/28/2000.  His last INR was 7.0 RATIO.  Anticoagulation responsible provider: Eden Emms MD, Theron Arista.  INR POC: 2.0.  Cuvette Lot#: [id number].  Exp: 06/2010.  Anticoagulation Management Assessment/Plan:      The patient's current anticoagulation dose is Warfarin sodium 2.5 mg tabs: Take by mouth as directed.  The target INR is 2 - 3.  The next INR is due 04/11/2009.  Anticoagulation instructions were given to patient.  Results were reviewed/authorized by Bethena Midget, RN, BSN.  He was notified by Bethena Midget, RN, BSN.         Prior Anticoagulation Instructions: INR 2.2 Continue 1 pill everyday except on Mondays take 1/2 pill. Recheck in 3 weeks.   Current Anticoagulation Instructions: INR 2.0 Continue 1 tablet daily except 1/2 tablet on Mondays. Recheck in 3 weeks.

## 2010-04-09 NOTE — Progress Notes (Signed)
Summary: refill  Phone Note Refill Request Message from:  Patient on June 15, 2009 12:38 PM  Refills Requested: Medication #1:  AMLODIPINE BESYLATE 10 MG TABS Take one  tablet by mouth daily   Supply Requested: 3 months [facility]   Method Requested: Fax to Local Pharmacy Initial call taken by: Migdalia Dk,  June 15, 2009 12:39 PM  Follow-up for Phone Call        Rx faxed to pharmacy Follow-up by: Oswald Hillock,  June 15, 2009 2:01 PM    Prescriptions: NORVASC 10 MG TABS (AMLODIPINE BESYLATE) Take 1 tablet by mouth once a day  #30 x 6   Entered by:   Oswald Hillock   Authorized by:   Lenoria Farrier, MD, [facility]   Signed by:   Oswald Hillock on 06/15/2009   Method used:   Faxed to ...       [facility] (retail)       [phone]       [place], [place]  [address]       Ph: [phone]       Fax: [phone]   RxID:   [id number]

## 2010-04-09 NOTE — Medication Information (Signed)
Summary: rov/sp  Anticoagulant Therapy  Managed by: Bethena Midget, RN, BSN Referring MD: Charlies Constable MD PCP: Salome Holmes MD: Shirlee Latch MD, Dehaven Sine Indication 1: Atrial Fibrillation (ICD-427.31) Lab Used: Primary school teacher Site: [address] INR POC 2.5 INR RANGE 2 - 3  Dietary changes: yes       Details: Ate extra green leafy veggies  Health status changes: no    Bleeding/hemorrhagic complications: no    Recent/future hospitalizations: no    Any changes in medication regimen? yes       Details: In past 2 weeks pt has not taken his vitamins or fish oil   Recent/future dental: no  Any missed doses?: no       Is patient compliant with meds? yes       Allergies: 1)  ! * Contrast Dye  Anticoagulation Management History:      The patient is taking warfarin and comes in today for a routine follow up visit.  Positive risk factors for bleeding include an age of 75 years or older and presence of serious comorbidities.  The bleeding index is 'intermediate risk'.  Positive CHADS2 values include History of HTN, Age > 82 years old, and History of Diabetes.  The start date was 04/28/2000.  His last INR was 7.0 RATIO.  Anticoagulation responsible provider: Shirlee Latch MD, Ariah Mower.  INR POC: 2.5.  Cuvette Lot#: [id number].  Exp: 12/2010.    Anticoagulation Management Assessment/Plan:      The patient's current anticoagulation dose is Warfarin sodium 2.5 mg tabs: Take by mouth as directed.  The target INR is 2 - 3.  The next INR is due 11/09/2009.  Anticoagulation instructions were given to patient.  Results were reviewed/authorized by Bethena Midget, RN, BSN.  He was notified by Bethena Midget, RN, BSN.         Prior Anticoagulation Instructions: INR- 4.6  Skip Thursday and Friday's dose of Coumadin then resume same dose of 1 tablet every day.    Current Anticoagulation Instructions: INR 2.5 Continue 2.5mg s everyday. Recheck in 3 weeks.

## 2010-04-09 NOTE — Medication Information (Signed)
Summary: rov/eac  Anticoagulant Therapy  Managed by: Cloyde Reams, RN, BSN Referring MD: Charlies Constable MD PCP: [name] Supervising MD: Jens Som MD, Arlys John Indication 1: Atrial Fibrillation (ICD-427.31) Lab Used: Primary school teacher Site: [address] INR POC 2.4 INR RANGE 2 - 3  Dietary changes: no    Health status changes: no    Bleeding/hemorrhagic complications: yes       Details: Pt reports 4-5 episode of nosebleeds approx 3-4 days apart.  Stops easily with pressure.  Recent/future hospitalizations: no    Any changes in medication regimen? no    Recent/future dental: no  Any missed doses?: no       Is patient compliant with meds? yes      Comments: Pt reports taking 1 tablet daily.  Allergies: 1)  ! * Contrast Dye  Anticoagulation Management History:      The patient is taking warfarin and comes in today for a routine follow up visit.  Positive risk factors for bleeding include an age of 75 years or older and presence of serious comorbidities.  The bleeding index is 'intermediate risk'.  Positive CHADS2 values include History of HTN, Age > 72 years old, and History of Diabetes.  The start date was 04/28/2000.  His last INR was 7.0 RATIO.  Anticoagulation responsible provider: Jens Som MD, Arlys John.  INR POC: 2.4.  Cuvette Lot#: [id number].  Exp: 07/2010.    Anticoagulation Management Assessment/Plan:      The patient's current anticoagulation dose is Warfarin sodium 2.5 mg tabs: Take by mouth as directed.  The target INR is 2 - 3.  The next INR is due 06/20/2009.  Anticoagulation instructions were given to patient.  Results were reviewed/authorized by Cloyde Reams, RN, BSN.  He was notified by Cloyde Reams RN.         Prior Anticoagulation Instructions: INR 1.9  Start NEW dosing schedule of 1 tablet daily, except 1.5 tablets on Wednesday.  Return to clinic in 3 weeks.      Current Anticoagulation Instructions: INR 2.4  Continue on same dosage 2.5mg  daily.   Recheck in 4 weeks.

## 2010-04-09 NOTE — Medication Information (Signed)
Summary: Darryl Carroll  Anticoagulant Therapy  Managed by: Weston Brass, PharmD, BCPS Referring MD: Charlies Constable MD PCP: [name] Supervising MD: Tenny Craw MD, Gunnar Fusi Indication 1: Atrial Fibrillation (ICD-427.31) Lab Used: Primary school teacher Site: [address] INR POC 1.9 INR RANGE 2 - 3  Dietary changes: no    Health status changes: no    Bleeding/hemorrhagic complications: no    Recent/future hospitalizations: no    Any changes in medication regimen? no    Recent/future dental: no  Any missed doses?: no       Is patient compliant with meds? yes       Allergies: 1)  ! * Contrast Dye  Anticoagulation Management History:      The patient is taking warfarin and comes in today for a routine follow up visit.  Positive risk factors for bleeding include an age of 75 years or older and presence of serious comorbidities.  The bleeding index is 'intermediate risk'.  Positive CHADS2 values include History of HTN, Age > 89 years old, and History of Diabetes.  The start date was 04/28/2000.  His last INR was 7.0 RATIO.  Anticoagulation responsible provider: Tenny Craw MD, Gunnar Fusi.  INR POC: 1.9.  Cuvette Lot#: [id number].  Exp: 01/2011.    Anticoagulation Management Assessment/Plan:      The patient's current anticoagulation dose is Warfarin sodium 2.5 mg tabs: Take by mouth as directed.  The target INR is 2 - 3.  The next INR is due 01/04/2010.  Anticoagulation instructions were given to patient.  Results were reviewed/authorized by Weston Brass, PharmD, BCPS.  He was notified by Weston Brass PharmD.         Prior Anticoagulation Instructions: INR 2.8   Continue taking one tablet every day.  We will see you in four weeks to recheck your INR.  Current Anticoagulation Instructions: INR 1.9  Take an extra 1/2 tablet today then resume same dose of 1 tablet every day.  Recheck INR in 4 weeks.

## 2010-04-09 NOTE — Assessment & Plan Note (Signed)
Summary: yearly   Primary Provider:  [facility]   History of Present Illness: The patient is 77 years old and return for management of CAD and atrial fibrillation. He has had multiple PCI procedures and has stents in all 3 vessels. His last PCI was in 2006 at which time he had overlapping stents placed in the circumflex artery. He also has a history of paroxysmal atrial fibrillation which is managed with rate control and Coumadin.  He has been doing well recently he has had no chest pain shortness of breath or palpitations.  Problems Prior to Update: 1)  Cad  (ICD-414.00) 2)  Fibrillation, Atrial  (ICD-427.31) 3)  Hypertension  (ICD-401.9) 4)  Hyperlipidemia  (ICD-272.4) 5)  Diabetes Mellitus, Type II  (ICD-250.00) 6)  Spinal Stenosis  (ICD-724.00) 7)  Glaucoma  (ICD-365.9) 8)  Gout  (ICD-274.9) 9)  Macular Degeneration  (ICD-362.50) 10)  Obesity  (ICD-278.00) 11)  Osteoporosis  (ICD-733.00) 12)  Sleep Apnea  (ICD-780.57) 13)  Peptic Ulcer Disease  (ICD-533.90)  Current Medications (verified): 1)  Metformin Hcl 500 Mg Tabs (Metformin Hcl) .... 2 Tabs By Mouth Two Times A Day With Meals. 2)  Gemfibrozil 600 Mg Tabs (Gemfibrozil) .... Take One Tablet By Mouth Twice A Day With Meals 3)  Atenolol 100 Mg Tabs (Atenolol) .... Take One Tablet By Mouth Daily 4)  Lisinopril 40 Mg Tabs (Lisinopril) .Marland Kitchen.. 1 Tab By Mouth Once Daily 5)  Indapamide 2.5 Mg Tabs (Indapamide) .Marland Kitchen.. 1 Tab By Mouth Two Times A Day 6)  Furosemide 20 Mg Tabs (Furosemide) .... Take One Tablet By Mouth Daily. 7)  Albuterol Sulfate (5 Mg/ml) 0.5% Nebu (Albuterol Sulfate) .... As Needed 8)  Norvasc 10 Mg Tabs (Amlodipine Besylate) .... Take 1 Tablet By Mouth Once A Day 9)  Warfarin Sodium 2.5 Mg Tabs (Warfarin Sodium) .... Take By Mouth As Directed 10)  Nitrostat 0.4 Mg Subl (Nitroglycerin) .Marland Kitchen.. 1 Tablet Under Tongue At Onset of Chest Pain; You May Repeat Every 5 Minutes For Up To 3 Doses. 11)  Preservision/lutein  Caps  (Multiple Vitamins-Minerals) .Marland Kitchen.. 1 By Mouth Two Times A Day 12)  Glipizide 10 Mg Tabs (Glipizide) .Marland Kitchen.. 1 By Mouth Two Times A Day Before Meals For Diabetes. 13)  Oscal 500/200 D-3 500-200 Mg-Unit Tabs (Calcium-Vitamin D) .Marland Kitchen.. 1 By Mouth Two Times A Day With Meals. 14)  Trazodone Hcl 100 Mg Tabs (Trazodone Hcl) .Marland Kitchen.. 1 By Mouth Daily At Bedtime. 15)  Proscar 5 Mg Tabs (Finasteride) .Marland Kitchen.. 1 By Mouth Daily. 16)  Crestor 10 Mg Tabs (Rosuvastatin Calcium) .... Take One Tab Every Other Day and 1/2 Tab On Alternating Days. 17)  Geodon 80 Mg Caps (Ziprasidone Hcl) .Marland Kitchen.. 1 By Mouth At Bedtime.  Allergies: 1)  ! * Contrast Dye  Past History:  Past Surgical History: Last updated: 08-19-2008  1. Thyroglossal duct cyst removed in January or February 2005.  2. Bilateral carpal tunnel surgery.  3. Four or five cardiac catheterizations.  4. TURP.  5.  multiple percutaneous  interventions in all three vessels with drug-eluting stents.       Family History: Last updated: 2008/08/19   His mother died at age 51 with diabetes, and his father  died at age 27 with an MI.  His brother died at age 68 with an MI.  Social History: Last updated: 08/15/2008  non-smoker, non-drinker, no drug abuse  He lives at the [place] home with his wife.  Past Medical History: Reviewed history from 2008-08-19 and no changes required.  Current Problems:  DIABETES MELLITUS, TYPE II (ICD-250.00) SPINAL STENOSIS (ICD-724.00) GLAUCOMA (ICD-365.9) GOUT (ICD-274.9) MACULAR DEGENERATION (ICD-362.50) OBESITY (ICD-278.00) OSTEOPOROSIS (ICD-733.00) 1. Coronary artery disease status post multiple percutaneous     interventions in all three vessels with drug-eluting stents.  Last PCI 2005 with overlapping  Cypher    stents in CFX bifurcation lesion. 2. Good left ventricular function. 3. Paroxysmal atrial fibrillation. 4. Hypertension, not under good control. 5. Hyperlipidemia.  SLEEP APNEA (ICD-780.57) PEPTIC ULCER DISEASE  (ICD-533.90)  Review of Systems       ROS is negative except as outlined in HPI.   Vital Signs:  Patient profile:   77 year old male Height:      68 inches Weight:      194 pounds BMI:     29.60 Pulse rate:   48 / minute Resp:     12 per minute BP sitting:   124 / 67  (left arm)  Vitals Entered By: Burnett Kanaris, CNA (August 15, 2009 11:21 AM)   Physical Exam  Additional Exam:  Gen. Well-nourished, in no distress   Neck: No JVD, thyroid not enlarged, no carotid bruits Lungs: No tachypnea, clear without rales, rhonchi or wheezes Cardiovascular: Rhythm regular, PMI not displaced,  heart sounds  normal, no murmurs or gallops, no peripheral edema, can't feel pedal pulses Abdomen: BS normal, abdomen soft and non-tender without masses or organomegaly, no hepatosplenomegaly. MS: No deformities, no cyanosis or clubbing   Neuro:  No focal sns   Skin:  no lesions    Impression & Recommendations:  Problem # 1:  CAD (ICD-414.00) He has had remote PCI and has stents in all 3 vessels. He's had no recent chest pain in the thumb appears stable. We will continue current therapy. The following medications were removed from the medication list:    Aspirin 81 Mg Tbec (Aspirin) .Marland Kitchen... Take one tablet by mouth daily    Amlodipine Besylate 10 Mg Tabs (Amlodipine besylate) .Marland Kitchen... Take one  tablet by mouth daily His updated medication list for this problem includes:    Atenolol 100 Mg Tabs (Atenolol) .Marland Kitchen... Take one tablet by mouth daily    Lisinopril 40 Mg Tabs (Lisinopril) .Marland Kitchen... 1 tab by mouth once daily    Norvasc 10 Mg Tabs (Amlodipine besylate) .Marland Kitchen... Take 1 tablet by mouth once a day    Warfarin Sodium 2.5 Mg Tabs (Warfarin sodium) .Marland Kitchen... Take by mouth as directed    Nitrostat 0.4 Mg Subl (Nitroglycerin) .Marland Kitchen... 1 tablet under tongue at onset of chest pain; you may repeat every 5 minutes for up to 3 doses.  Orders: EKG w/ Interpretation (93000)  Problem # 2:  FIBRILLATION, ATRIAL (ICD-427.31) He  has a history of paroxysmal atrial fibrillation but has had no recent symptomatic recurrences. He is managed with rate control and Coumadin and we will continue current therapy. The following medications were removed from the medication list:    Aspirin 81 Mg Tbec (Aspirin) .Marland Kitchen... Take one tablet by mouth daily His updated medication list for this problem includes:    Atenolol 100 Mg Tabs (Atenolol) .Marland Kitchen... Take one tablet by mouth daily    Warfarin Sodium 2.5 Mg Tabs (Warfarin sodium) .Marland Kitchen... Take by mouth as directed  Problem # 3:  PERIPHERAL VASCULAR INSUFFICIENCY,  LEGS, BILATERAL (ICD-443.9) I cannot feel pulses in the left foot. He did not have any symptoms of claudication. We will evaluate this further with arterial Doppler studies.  Problem # 4:  HYPERTENSION (ICD-401.9) This is  well controlled on current medications. The following medications were removed from the medication list:    Aspirin 81 Mg Tbec (Aspirin) .Marland Kitchen... Take one tablet by mouth daily    Amlodipine Besylate 10 Mg Tabs (Amlodipine besylate) .Marland Kitchen... Take one  tablet by mouth daily    Furosemide 20 Mg Tabs (Furosemide) .Marland Kitchen... Take one tablet by mouth daily. His updated medication list for this problem includes:    Atenolol 100 Mg Tabs (Atenolol) .Marland Kitchen... Take one tablet by mouth daily    Lisinopril 40 Mg Tabs (Lisinopril) .Marland Kitchen... 1 tab by mouth once daily    Indapamide 2.5 Mg Tabs (Indapamide) .Marland Kitchen... 1 tab by mouth two times a day    Furosemide 20 Mg Tabs (Furosemide) .Marland Kitchen... Take one tablet by mouth daily.    Norvasc 10 Mg Tabs (Amlodipine besylate) .Marland Kitchen... Take 1 tablet by mouth once a day  Other Orders: Arterial Duplex Lower Extremity (Arterial Duplex Low)  Patient Instructions: 1)  Your physician wants you to follow-up in: 1 year with Dr. Clifton James.  You will receive a reminder letter in the mail two months in advance. If you don't receive a letter, please call our office to schedule the follow-up appointment. 2)  Your physician has  requested that you have a lower extremity arterial duplex.  This test is an ultrasound of the arteries in the legs or arms.  It looks at arterial blood flow in the legs and arms.  Allow one hour for Lower and Upper Arterial scans. There are no restrictions or special instructions. 3)  Your physician recommends that you continue on your current medications as directed. Please refer to the Current Medication list given to you today. Prescriptions: FUROSEMIDE 20 MG TABS (FUROSEMIDE) Take one tablet by mouth daily.  #90 x 3   Entered by:   Sherri Rad, RN, BSN   Authorized by:   Lenoria Farrier, MD, [facility]   Signed by:   Sherri Rad, RN, BSN on 08/15/2009   Method used:   Print then Give to Patient   RxID:   [id number]

## 2010-04-09 NOTE — Progress Notes (Signed)
Summary: recertify w/c sticker  Phone Note Call from Patient Call back at Home Phone [phone]   Caller: Patient Reason for Call: Talk to Nurse Summary of Call: per pt calling, need recertify w./c sticker.  Initial call taken by: Lorne Skeens,  September 24, 2009 8:50 AM  Follow-up for Phone Call        09/24/09-1030am--pt wanting handicapped placard--explained i would fill out form, but dr Juanda Chance will have to sign and we'll let him know when to come pic up--pt agrees--nt Follow-up by: Ledon Snare, RN,  September 24, 2009 10:22 AM     Appended Document: recertify w/c sticker The pt is aware this is ready for pick up.

## 2010-04-11 NOTE — Medication Information (Signed)
Summary: rov  Anticoagulant Therapy  Managed by: Darryl Carroll, Darryl Carroll Referring MD: Charlies Constable MD PCP: [name] Supervising MD: Jens Som MD,Brian Indication 1: Atrial Fibrillation (ICD-427.31) Lab Used: Primary school teacher Site: [address] INR POC 2.2 INR RANGE 2 - 3  Dietary changes: no    Health status changes: no    Bleeding/hemorrhagic complications: no    Recent/future hospitalizations: no    Any changes in medication regimen? yes       Details: Started taking Alendronate  Recent/future dental: no  Any missed doses?: no       Is patient compliant with meds? yes       Allergies: 1)  ! * Contrast Dye  Anticoagulation Management History:      The patient is taking warfarin and comes in today for a routine follow up visit.  Positive risk factors for bleeding include an age of 75 years or older and presence of serious comorbidities.  The bleeding index is 'intermediate risk'.  Positive CHADS2 values include History of HTN, Age > 41 years old, and History of Diabetes.  The start date was 04/28/2000.  His last INR was 7.0 RATIO and today's INR is 2.2.  Anticoagulation responsible provider: Jens Som MD,Brian.  INR POC: 2.2.  Cuvette Lot#: [id number].  Exp: 04/2011.    Anticoagulation Management Assessment/Plan:      The patient's current anticoagulation dose is Warfarin sodium 2.5 mg tabs: Take by mouth as directed.  The target INR is 2 - 3.  The next INR is due 04/12/2010.  Anticoagulation instructions were given to patient.  Results were reviewed/authorized by Darryl Carroll, Darryl Carroll.  He was notified by Darryl Carroll Darryl Carroll.         Prior Anticoagulation Instructions: INR:  2.8  Your INR is at goal.  Please continue to take your Warfarin at 2.5 mg every day.    Please come back for another INR check in 4 weeks.  Current Anticoagulation Instructions: INR 2.2 (2-3)  Continue taking 1 tablet everyday.  Return to clinic in 4 weeks: Friday, Feb 3rd at  8:15AM.

## 2010-04-12 ENCOUNTER — Ambulatory Visit: Admit: 2010-04-12 | Payer: Self-pay

## 2010-04-12 ENCOUNTER — Encounter (INDEPENDENT_AMBULATORY_CARE_PROVIDER_SITE_OTHER): Payer: Medicare Other

## 2010-04-12 ENCOUNTER — Encounter: Payer: Self-pay | Admitting: Cardiology

## 2010-04-12 DIAGNOSIS — I4891 Unspecified atrial fibrillation: Secondary | ICD-10-CM

## 2010-04-12 DIAGNOSIS — Z7901 Long term (current) use of anticoagulants: Secondary | ICD-10-CM

## 2010-04-12 LAB — CONVERTED CEMR LAB: POC INR: 1.8

## 2010-04-17 NOTE — Medication Information (Signed)
Summary: rov/kh  Anticoagulant Therapy  Managed by: Tammy Sours, PharmD Referring MD: Charlies Constable MD PCP: [name] Supervising MD: Jens Som MD,Symon Norwood Indication 1: Atrial Fibrillation (ICD-427.31) Lab Used: Primary school teacher Site: [address] INR POC 1.8 INR RANGE 2 - 3  Dietary changes: no    Health status changes: no    Bleeding/hemorrhagic complications: no    Recent/future hospitalizations: no    Any changes in medication regimen? no    Recent/future dental: no  Any missed doses?: no       Is patient compliant with meds? yes      Comments: Recommended patient coming back in 2-3 weeks, but patient insisted on coming back in 4 weeks.   Allergies: 1)  ! * Contrast Dye  Anticoagulation Management History:      The patient is taking warfarin and comes in today for a routine follow up visit.  Positive risk factors for bleeding include an age of 77 years or older and presence of serious comorbidities.  The bleeding index is 'intermediate risk'.  Positive CHADS2 values include History of HTN, Age > 21 years old, and History of Diabetes.  The start date was 04/28/2000.  His last INR was 2.2.  Anticoagulation responsible provider: Jens Som MD,Jakyrie Totherow.  INR POC: 1.8.  Cuvette Lot#: [id number].  Exp: 04/2011.    Anticoagulation Management Assessment/Plan:      The patient's current anticoagulation dose is Warfarin sodium 2.5 mg tabs: Take by mouth as directed.  The target INR is 2 - 3.  The next INR is due 05/09/2010.  Anticoagulation instructions were given to patient.  Results were reviewed/authorized by Tammy Sours, PharmD.         Prior Anticoagulation Instructions: INR 2.2 (2-3)  Continue taking 1 tablet everyday.  Return to clinic in 4 weeks: Friday, Feb 3rd at 8:15AM.  Current Anticoagulation Instructions: INR 1.8  Take 2 tablets today only. Then resume taking 1 tablet daily. Recheck INR in 4 weeks.

## 2010-04-30 DIAGNOSIS — I4891 Unspecified atrial fibrillation: Secondary | ICD-10-CM

## 2010-05-09 ENCOUNTER — Encounter: Payer: Self-pay | Admitting: Cardiology

## 2010-05-09 ENCOUNTER — Encounter (INDEPENDENT_AMBULATORY_CARE_PROVIDER_SITE_OTHER): Payer: Medicare Other

## 2010-05-09 DIAGNOSIS — I4891 Unspecified atrial fibrillation: Secondary | ICD-10-CM

## 2010-05-09 DIAGNOSIS — Z7901 Long term (current) use of anticoagulants: Secondary | ICD-10-CM

## 2010-05-16 NOTE — Medication Information (Signed)
Summary: rov/tm  Anticoagulant Therapy  Managed by: Weston Brass, PharmD Referring MD: Charlies Constable MD PCP: [name] Supervising MD: Patty Sermons Indication 1: Atrial Fibrillation (ICD-427.31) Lab Used: Primary school teacher Site: [address] INR POC 2.4 INR RANGE 2 - 3  Dietary changes: yes       Details: increased salads  Health status changes: no    Bleeding/hemorrhagic complications: no    Recent/future hospitalizations: no    Any changes in medication regimen? no    Recent/future dental: no  Any missed doses?: no       Is patient compliant with meds? yes       Allergies: 1)  ! * Contrast Dye  Anticoagulation Management History:      The patient is taking warfarin and comes in today for a routine follow up visit.  Positive risk factors for bleeding include an age of 41 years or older and presence of serious comorbidities.  The bleeding index is 'intermediate risk'.  Positive CHADS2 values include History of HTN, Age > 16 years old, and History of Diabetes.  The start date was 04/28/2000.  His last INR was 2.2.  Anticoagulation responsible provider: Jeree Delcid.  INR POC: 2.4.  Cuvette Lot#: [id number].  Exp: 03/2011.    Anticoagulation Management Assessment/Plan:      The patient's current anticoagulation dose is Warfarin sodium 2.5 mg tabs: Take by mouth as directed.  The target INR is 2 - 3.  The next INR is due 06/06/2010.  Anticoagulation instructions were given to patient.  Results were reviewed/authorized by Weston Brass, PharmD.  He was notified by Weston Brass PharmD.         Prior Anticoagulation Instructions: INR 1.8  Take 2 tablets today only. Then resume taking 1 tablet daily. Recheck INR in 4 weeks.  Current Anticoagulation Instructions: INR 2.4  Continue same dose of 1 tablet every day.  Recheck INR in 4 weeks.

## 2010-06-06 ENCOUNTER — Ambulatory Visit (INDEPENDENT_AMBULATORY_CARE_PROVIDER_SITE_OTHER): Payer: Medicare Other | Admitting: *Deleted

## 2010-06-06 DIAGNOSIS — I4891 Unspecified atrial fibrillation: Secondary | ICD-10-CM

## 2010-06-06 DIAGNOSIS — Z7901 Long term (current) use of anticoagulants: Secondary | ICD-10-CM

## 2010-06-06 MED ORDER — WARFARIN SODIUM 2.5 MG PO TABS: 2.5000 mg | ORAL_TABLET | ORAL | Status: DC

## 2010-06-06 NOTE — Patient Instructions (Signed)
INR 2.1 Continue taking 1 tablet (2.5mg ) daily. Recheck in 4 weeks.

## 2010-06-17 LAB — BASIC METABOLIC PANEL
Calcium: 8.1 mg/dL — ABNORMAL LOW (ref 8.4–10.5)
GFR calc Af Amer: >60 mL/min (ref >60)
GFR calc non Af Amer: >60 mL/min (ref >60)
Potassium: 3.7 mEq/L (ref 3.5–5.1)
Sodium: 133 mEq/L — ABNORMAL LOW (ref 135–145)

## 2010-06-17 LAB — APTT: aPTT: 37 seconds (ref 24–37)

## 2010-06-17 LAB — COMPREHENSIVE METABOLIC PANEL
AST: 29 U/L (ref 0–37)
Albumin: 3.8 g/dL (ref 3.5–5.2)
BUN: 19 mg/dL (ref 6–23)
CO2: 28 mEq/L (ref 19–32)
Calcium: 9.4 mg/dL (ref 8.4–10.5)
Chloride: 103 mEq/L (ref 96–112)
Creatinine, Ser: 1.02 mg/dL (ref 0.4–1.5)
GFR calc Af Amer: >60 mL/min (ref >60)
GFR calc non Af Amer: >60 mL/min (ref >60)
Total Bilirubin: 0.7 mg/dL (ref 0.3–1.2)

## 2010-06-17 LAB — CBC
HCT: 37 % — ABNORMAL LOW (ref 39.0–52.0)
MCHC: 34 g/dL (ref 30.0–36.0)
MCV: 91.1 fL (ref 78.0–100.0)
Platelets: 217 10*3/uL (ref 150–400)
WBC: 8.1 10*3/uL (ref 4.0–10.5)

## 2010-06-17 LAB — GLUCOSE, CAPILLARY
Glucose-Capillary: 153 mg/dL — ABNORMAL HIGH (ref 70–99)
Glucose-Capillary: 174 mg/dL — ABNORMAL HIGH (ref 70–99)
Glucose-Capillary: 202 mg/dL — ABNORMAL HIGH (ref 70–99)

## 2010-06-17 LAB — PROTIME-INR: INR: 1.1 (ref 0.00–1.49)

## 2010-06-17 LAB — URINALYSIS, ROUTINE W REFLEX MICROSCOPIC
Bilirubin Urine: NEGATIVE
Ketones, ur: NEGATIVE mg/dL
Nitrite: NEGATIVE
Urobilinogen, UA: 1 mg/dL (ref 0.0–1.0)

## 2010-07-05 ENCOUNTER — Ambulatory Visit (INDEPENDENT_AMBULATORY_CARE_PROVIDER_SITE_OTHER): Payer: Medicare Other | Admitting: *Deleted

## 2010-07-05 DIAGNOSIS — I4891 Unspecified atrial fibrillation: Secondary | ICD-10-CM

## 2010-07-05 LAB — POCT INR: INR: 1.8

## 2010-07-23 NOTE — Assessment & Plan Note (Signed)
[facility]                            CARDIOLOGY OFFICE NOTE   NAME:WHITLOCKRiker, Collier                     MRN:          [id number]  DATE:08/09/2007                            DOB:          11-07-1927    PRIMARY CARE PHYSICIAN:  Dr. Merlene Laughter, and the [facility] in  [place].   CLINICAL HISTORY:  Mr. Gwinner is 77 years old and returns for follow-  up management of coronary heart disease.  He has had multiple  percutaneous interventions both here and at [facility].  He moved back from  [place] to live in the [facility] complex.   He says he has been doing fairly well from the standpoint of his heart.  He has had no recent chest pain or shortness of breath or palpitations.   His past medical history is significant for hypertension, hyperlipidemia  and diabetes.  He indicated that his blood pressure has been elevated  recently with systolics in the range of 160.  He also has a history of  paroxysmal atrial fibrillation and is on chronic Coumadin and rate  control medications for that.   CURRENT MEDICATIONS:  1. Finasteride.  2. Glipizide.  3. Metformin.  4. TriCor.  5. Crestor.  6. Warfarin.  7. Prilosec.  8. Atenolol 100 mg daily.  9. Lisinopril 40 mg daily.  10.Indapamide.  11.Allopurinol.  12.Citalopram.  13.Albuterol.   EXAMINATION:  The blood pressure is 162/84.  There was no venous  distension.  The carotid pulses were full without bruits.  CHEST:  Clear.  CARDIAC:  Rhythm was regular.  I could hear no murmurs or gallops.  ABDOMEN:  Soft with normal bowel sounds.  There is no  hepatosplenomegaly.  There is no peripheral edema and the pedal pulses  were equal.   IMPRESSION:  1. Coronary artery disease status post multiple percutaneous      interventions in all three vessels with drug-eluting stents, now      stable.  2. Good left ventricular function.  3. Paroxysmal atrial fibrillation.  4. Hypertension, not under  good control.  5. Hyperlipidemia.   RECOMMENDATIONS:  We will plan to add Norvasc 5 mg for blood pressure  control.  Will get a blood pressure check in 2 weeks when he gets his  pro time checked.  Will plan to see him back in follow-up in a year.  He  will plan to get his medicines from the [facility] and he also sees Dr.  [name].     Bruce Elvera Lennox Juanda Chance, MD, [facility]  Electronically Signed    BRB/MedQ  DD: 08/09/2007  DT: 08/09/2007  Job #: [id number]

## 2010-07-23 NOTE — Letter (Signed)
Jul 12, 2007     RE:  Darryl Carroll, Darryl Carroll  MRN:  [id number]  /  DOB:  11/06/1927   To Whom it May Concern:   I have been asked by Darryl Carroll to send a letter regarding his current  antihyperlipidemic therapy.   Darryl Carroll has been tried, over the years, on multiple medications  including; but not limited to Zocor, Lipitor, niacin, and Pravachol  without success; and additionally has suffered LFT elevations and/or CK  changes with some of the above combinations.  The patient has currently  been placed and remains stable on Crestor, in combination with Tricor.  I request reconsideration of coverage for these two medications for Mr.  Carroll.   Please contact me with questions at [phone].    Sincerely,       Darryl Carroll, PharmD, BCPS, CPP  Electronically Signed      Everardo Beals Juanda Chance, MD, [facility]  Electronically Signed   MP/MedQ  DD: 07/12/2007  DT: 07/12/2007  Job #: [id number]   CC:    Dr. Hilbert Odor, [address] [place], [place] [address]

## 2010-07-23 NOTE — Assessment & Plan Note (Signed)
[facility]                            CARDIOLOGY OFFICE NOTE   NAME:Mcclatchy, GIBBS NAUGLE                     MRN:          [id number]  DATE:08/11/2006                            DOB:          [birth year]    PRIMARY CARE PHYSICIAN:  [name], M.D. and the [facility].   CLINICAL HISTORY:  Mr. Charters is 77 years old and has had multiple  percutaneous coronary interventions.  He has drug-eluting stents in all  3 vessels.  He had some of his interventions done in [place].  We  initially saw him here, then he moved to [place], then he moved back to  [place].   He had been doing fairly well from a heart standpoint.  He has had no  recent chest pain, shortness of breath or palpitations.  A couple of  weeks ago, he had an episode when he was in [place] where his vision  suddenly went black.  He saw his eye doctor, and they did an examination  and suggested that he have carotid Doppler studies.  He does have a  history of macular degeneration.   His cardiac history is also significant for paroxysmal atrial  fibrillation for which he takes Coumadin and rate control medications.   PAST MEDICAL HISTORY:  1. Hypertension.  2. Hyperlipidemia.  3. Recently diagnosed diabetes.   CURRENT MEDICATIONS:  1. Tricor.  2. Crestor.  3. Prilosec.  4. Lisinopril.  5. Atenolol.  6. Allopurinol.  7. Terazosin.  8. Albuterol.  9. Aspirin.  10.Coumadin.  11.Potassium.   PHYSICAL EXAMINATION:  VITAL SIGNS:  Blood pressure is 142/90, pulse 55  and regular.  NECK:  There was no venous distention.  The carotid pulses were full,  and I was unable to hear any bruits.  CHEST:  Clear without rales or rhonchi.  CARDIAC:  Rhythm was regular.  There were no murmurs or gallops.  ABDOMEN:  Soft without organomegaly.  EXTREMITIES:  Peripheral pulses were full.  There was no peripheral  edema.   ELECTROCARDIOGRAM:  Sinus bradycardia and a prolonged QT  interval.   IMPRESSION:  1. Recent episode of visual disturbance of uncertain etiology,      possible transient ischemic attack.  2. Coronary artery disease status post multiple percutaneous      interventions in all 3 vessels as described above with drug-eluting      stents.  3. Good left ventricular function.  4. Paroxysmal atrial fibrillation.  5. Hypertension.  6. Hyperlipidemia.   RECOMMENDATIONS:  I am not certain regarding the etiology of Mr.  Klutz's recent visual disturbance.  There is a possibility this could  be a TIA, although it is not a typical hemianopsia.  Will plan to  evaluate him with carotid Doppler studies.  Will also get lab studies  including a fasting lipid profile, a CBC, BMP and hemoglobin A1c.  I  will plan to see him back in follow up in a year.     Bruce Elvera Lennox Juanda Chance, MD, [facility]  Electronically Signed    BRB/MedQ  DD: 08/11/2006  DT: 08/12/2006  Job #:  [id number] 

## 2010-07-23 NOTE — Op Note (Signed)
[name], [name] NO.:  [id number]   MEDICAL RECORD NO.:  [id number]          PATIENT TYPE:  INP   LOCATION:  5029                         FACILITY:  [facility]   PHYSICIAN:  Darryl Carroll, M.D.  DATE OF BIRTH:  [birth year]   DATE OF PROCEDURE:  08/31/2008  DATE OF DISCHARGE:                               OPERATIVE REPORT   PREOPERATIVE DIAGNOSIS:  Displaced left 4-part proximal humerus  fracture.   POSTOPERATIVE DIAGNOSIS:  Displaced left 4-part proximal humerus  fracture.   PROCEDURE:  Reverse shoulder arthroplasty utilizing a cemented size 12  DePuy stem with a 42 eccentric glenosphere and a +6 cup.   SURGEON:  Darryl Rea. Supple, MD   ASSISTANT:  Lucita Lora. Shuford, PA-C   ANESTHESIA:  General endotracheal as well as an interscalene block.   ESTIMATED BLOOD LOSS:  250 mL.   DRAINS:  None.   HISTORY:  Mr. Brodersen is a 77 year old gentleman who sustained a  displaced left 4-part proximal humerus fracture.  Due to the degree of  displacement, he is brought to the operating at this time for planned  left shoulder reverse arthroplasty.   Preoperatively, I had counseled Mr. Gacek on treatment options as  well as risks versus benefits thereof.  Possible surgical complications  of bleeding, infection, neurovascular injury, malunion, nonunion, loss  of fixation, instability, and possible need for revision surgery were  reviewed.  He understands and accepts and agrees for plan for reverse  arthroplasty.   PROCEDURE IN DETAIL:  After undergoing routine preop evaluation, the  patient received prophylactic antibiotics.  An interscalene block was  established in the holding area by the Anesthesia Department.  Placed  supine on the operating table and underwent smooth induction of general  endotracheal anesthesia.  Placed in the beach-chair position and  appropriated padded and protected.  Left shoulder girdle region was  sterilely prepped and draped in  standard fashion.  A time-out was  called.  An anterior deltopectoral approach was made to the left  shoulder through an approximately 15 cm incision along the deltopectoral  interval.  Skin flaps were elevated.  The deltopectoral interval was  identified.  The cephalic vein retracted laterally with the deltoid.  Dissection was carried deeply with the upper centimeter of the pec major  tendon tenotomized and the conjoined tendon was identified and retracted  medially.  We then identified the bicipital groove and dissected along  its interval which helped to separate the greater and lesser tuberosity  fragments and the biceps was then tenotomized.  We then divided the  rotator interval to the base of the coracoid and then completely  mobilized the lesser tuberosity and subscapularis.  The bone fragment on  the lesser tuberosity was contoured and then grasping sutures were  passed at the osteotendinous junction using #2 FiberWire and this was  then retracted medially.  In a similar fashion, the greater tuberosity  was mobilized, separated from the articular segment, and then tagged  with sutures and the segment of bone was also contoured.  This allowed  [place] to then extract the articular segment  which was removed in a single  piece.  Several small bone fragments in the axillary pouch and  posteriorly were also removed.  At this point, we then proceeded with  exposure of the glenoid and performed a circumferential removal and  release of the labrum and capsular tissues and obtained a nice exposure  of the glenoid.  We then placed a central guide pin and then reamed the  glenoid face and used the hand reamer to complete the reaming towards  the base of the coracoid.  Trial showed excellent contour and fit.  We  then drilled the central peg and then impacted the glenoid baseplate  obtaining excellent interference fit.  We then placed the inferior and  superior locking screws and the  anterior-posterior nonlocking screws to  the appropriate depth obtaining good bony purchase.  We then placed a  final 42 eccentric glenosphere, impacted this, and tightened  appropriately and had excellent fixation.  We turned our attention to  the humerus which was exposed through the incision and then  intramedullary reamer was passed and sequential reamings were performed  upto size 12.  We then performed some additional trimming of the more  proximal bone and then placed a size 12 trial and off this, we were able  to determine proper degree of retroversion of the implant and did some  trialing with the +3 polyethylene cup and this showed appropriate soft  tissue balance.  We then placed a distal cement plug into the humeral  canal and performed pulsatile lavage.  Two FiberWire were brought  through the humeral shaft anteriorly and laterally for later repair of  the tuberosities.  We then mixed cement, introduced this into the  humeral canal in retrograde fashion, and then placed the size 12 implant  in approximately 20 degrees of retroversion at the appropriate level.  Once this was completed, we then meticulously removed all extra cement.  Trial reductions were performed with 3, 6, and 9 and size 6 had the best  soft tissue balance and fit.  There was excellent shoulder mobility and  good stability noted.  We then removed the trial, meticulously cleaned  the humeral stem, and impacted the final +6 cup.  Final reduction was  then performed.  Good shoulder motion and stability was again noted.  We  then proceeded with the repair of the tuberosities.  We placed 2  FiberWire sutures through the neck of the implant to act as a cerclage  and these were then passed through the greater and lesser tuberosities  at appropriate levels.  We then repaired the tuberosities to one another  with the previously placed sutures and also then repaired the  tuberosities to the shaft through the previously  placed FiberWires  obtaining an excellent construct with good reposition of the  tuberosities to the shaft and base of the implant.  The rotator interval  was then closed for approximately 3 cm with interrupted figure-of-eight  #2 FiberWire sutures.  Once this was completed, the shoulder was taken  to range of motion showing excellent stability of the tuberosities to  the construct.  The wound was then irrigated.  Final hemostasis was  obtained.  The deltopectoral interval was then reapproximated with #1  Vicryl and a single tag suture of #2 FiberWire was placed in the  midpoint of the deltopectoral interval.  The subcu was closed with  interrupted 2-0 Vicryl sutures and intracuticular 3-0 Monocryl was used  to close the skin.  A  Mepilex dressing was then applied.  Left arm was  placed in a sling immobilizer.  The patient was then placed supine,  awakened, extubated, and taken to the recovery room in stable condition.      Darryl Carroll, M.D.  Electronically Signed     KMS/MEDQ  D:  08/31/2008  T:  09/01/2008  Job:  [id number]

## 2010-07-26 ENCOUNTER — Ambulatory Visit (INDEPENDENT_AMBULATORY_CARE_PROVIDER_SITE_OTHER): Payer: Medicare Other | Admitting: *Deleted

## 2010-07-26 ENCOUNTER — Telehealth: Payer: Self-pay

## 2010-07-26 DIAGNOSIS — I4891 Unspecified atrial fibrillation: Secondary | ICD-10-CM

## 2010-07-26 MED ORDER — INDAPAMIDE 2.5 MG PO TABS: 2.5000 mg | ORAL_TABLET | Freq: Two times a day (BID) | ORAL | Status: DC

## 2010-07-26 NOTE — Cardiovascular Report (Signed)
NAME:  Darryl Carroll                        ACCOUNT NO.:  [id number]   MEDICAL RECORD NO.:  [id number]                   PATIENT TYPE:  OIB   LOCATION:  2903                                 FACILITY:  [facility]   PHYSICIAN:  Charlies Constable, M.D. [facility]              DATE OF BIRTH:  [birth year]   DATE OF PROCEDURE:  06/01/2003  DATE OF DISCHARGE:  06/02/2003                              CARDIAC CATHETERIZATION   CLINICAL HISTORY:  Darryl Carroll is 77 years old and had a remote  circumflex artery treated with percutaneous transluminal coronary  angioplasty.  He was recently admitted with unstable angina and underwent  catheterization by Dr. Riley Kill and was found to have three-vessel disease.  On March 21, he underwent stenting of the left anterior descending artery  and percutaneous transluminal coronary angioplasty of a diagonal branch  (branch stenosis) with drug-eluting stent in the LAD and he also underwent  stenting of the proximal right coronary artery with Taxus stent.  He is  brought back today for intervention on the circumflex artery.   PROCEDURE:  The procedure was performed via the right femoral artery using  arterial sheath.  We chose an 41 [place] guiding catheter with plans to  rotablade the ostial lesion in the circumflex marginal vessel with plans to  stent the AV circumflex artery across the marginal branch.  We used an 8  [place] Voda 4 guiding catheter with side holes and a PT-2 wire.  We crossed  the lesion in the diagonal with the wire with only a moderate amount of  difficulty.  We then advanced a 1.75 bur into the proximal portion of the  circumflex artery.  We stabilized this with __________, but without  performing any cutting runs.  The patient became hypotensive at this point  and we were not totally sure of the etiology.  He had redness across his  shoulders and we were concerned about a possible dye allergy although his  pulse rate was also somewhat slow.  We thought  he might have had an  exaggerated response to the rotablator even though we only used the  __________mode.  He had gotten some nitroglycerin, but he had gotten  nitroglycerin earlier in the case without drastically dropping his blood  pressure.  We treated him with dopamine and he was on high dose dopamine for  60 mg for fairly prolonged period of time trying to stabilize him.  Because  of concern about dye reaction and because of concern about extreme  sensitivity to the rotablator and because he was very slow to respond to  pressor agents in restoring his blood pressure, we decided to stop the  procedure and not proceed. He was taken to the holding area.   RESULTS:  There was no intervention on either lesion in the circumflex or  marginal vessel and the lesions did not change from their original values of  75% in the AV  circumflex artery and 90% in the ostium of the marginal  branch.   CONCLUSIONS:  Aborted attempt at percutaneous coronary intervention of the  circumflex and circumflex marginal vessels due to marked hypotension  possibly related to dye reaction and possibly related the rotational  atherectomy device.   RECOMMENDATIONS:  The patient returned to the post holding area and finally  stabilized and was able to be weaned off his dopamine.  We elected to let  him go home the next day and with plans to bring him back in about a week  for a repeat attempt at intervention.  Will premedicate him with steroids,  H2 blockers and Benadryl for possible dye allergy and will plan not to use  the rotational atherectomy, but rather use a balloon or cutting balloon.                                               Charlies Constable, M.D. [facility]    BB/MEDQ  D:  06/05/2003  T:  06/06/2003  Job:  [id number]   cc:   Darryl Carroll, M.D.  [address] [place], [place] [address]  Fax: [phone]

## 2010-07-26 NOTE — Cardiovascular Report (Signed)
NAME:  Darryl Carroll, Darryl Carroll                        ACCOUNT NO.:  [id number]   MEDICAL RECORD NO.:  [id number]                   PATIENT TYPE:  INP   LOCATION:  3703                                 FACILITY:  [facility]   PHYSICIAN:  Arturo Morton. Riley Kill, M.D. [facility]         DATE OF BIRTH:  1928/01/24   DATE OF PROCEDURE:  DATE OF DISCHARGE:                              CARDIAC CATHETERIZATION   PROCEDURES PERFORMED:  1. Left heart catheterization.  2. Selective coronary arteriography.  3. Selective left ventriculography.  4. Subclavian angiography.  5. Angio-Seal closure of the right femoral artery.   CARDIOLOGIST:  Arturo Morton. Riley Kill, M.D.   INDICATIONS:  This is a very pleasant gentleman who presents with recurrent  angina pectoris.  He has had known three-vessel disease.  He has had prior  infarction.  Current study was done to assess coronary anatomy.   DESCRIPTION OF THE PROCEDURE:  The patient was brought to the cath lab and  prepped and draped in the usual fashion.  Through an anterior puncture the  right femoral artery was entered.  The procedure was performed using a 6  French sheath and standard Judkins catheters.   I reviewed the films with Dr. Juanda Chance in the catheterization laboratory.   At the end of the procedure we reprepped the groin.  The vessel was closed  using Angio-Seal with good hemostasis.  He was taken to the holding area in  satisfactory clinical condition.   HEMODYNAMIC DATA:  1. Central aortic pressure 131.75, mean 97.  2. Left ventricle 114/15.  3. No gradient on pullback across the aortic valve.   ANGIOGRAPHIC DATA:  1. Fluoroscopy:  On plane fluoroscopy there was moderate calcification of     all the coronary arteries.   1. Ventriculography:  Ventriculography in the RAO projection revealed     preserved global systolic function.  No segmental abnormalities or     contraction were identified.  Ejection fraction appeared to exceed 60%.   1. The subclavian  was moderately calcified, but without high-grade focal     obstruction.  The internal mammary appeared to be widely patent.   1. Left Main:  The left main demonstrates  mild tapering at the ostium.   1. Left Anterior Descending Artery:  The left anterior descending artery is     moderately calcified.  In the proximal vessel prior to the major septal     perforator there is about an 80% area of focal narrowing followed by     about 60% after the diagonal.  The diagonal itself has about 90% proximal     narrowing and is a small caliber vessel with somewhat slow antegrade flow     and segmental disease.  There is some modest irregularity in the distal     LAD.   1. Circumflex:  The circumflex provides a major marginal branch.  There is a     smaller marginal sub-branch.  The  marginal sub-branch has about 80%     narrowing.  Proximal to this there is a long segmental area of disease of     70-80% followed by a focal 80% area of eccentric plaquing prior to the     obtuse marginal.   1. Right Coronary Artery:  The right coronary artery demonstrates diffuse 70-     80% narrowing and then an 80% focal eccentric plaque prior to the     midvessel.  The midvessel then opens up widely and then there is a 50-60%     lesion just proximal to the PDA takeoff.  The posterolateral is without     critical disease.   CONCLUSION:  1. Preserved left ventricular function.  2. Patent subclavian and internal mammary.  3. Progressive disease in all three coronary arteries.   DISPOSITION:  I have reviewed the films with Dr. Juanda Chance.  He will discuss  with the patient options including multivessel percutaneous intervention  versus possible revascularization surgery.                                               Arturo Morton. Riley Kill, M.D. [facility]    TDS/MEDQ  D:  05/26/2003  T:  05/28/2003  Job:  [id number]   cc:   Charlies Constable, M.D. [facility]   CV Laboratory   [name], M.D.  [address] [place], [place] [address]  Fax: [phone]

## 2010-07-26 NOTE — Assessment & Plan Note (Signed)
[facility]                            CARDIOLOGY OFFICE NOTE   NAME:Darryl Carroll, Darryl Carroll                     MRN:          [id number]  DATE:05/12/2006                            DOB:          [birth year]    PRIMARY CARE PHYSICIAN:  [name], M.D.   CLINICAL HISTORY:  Darryl Carroll is 77 years old and has had multiple  percutaneous coronary interventions. He had a circumflex intervention  for a posterior MI many years ago and then moved to [facility] and then  returned to [place] and subsequently had a stent to the LAD, stent to  the right and a very difficult procedure stenting the circumflex artery.  He has done quite well since that time. He says he has had no recent  symptoms of shortness of breath or chest pain. He does have some  palpitations.   He also has a history of paroxysmal atrial fibrillation for which he  takes Coumadin and rate-control medications.   His past medical history is significant for:  1. Hypertension.  2. Hyperlipidemia.  3. Recently diagnosed diabetes.   CURRENT MEDICATIONS:  1. TriCor.  2. Crestor.  3. Atenolol.  4. Lisinopril.  5. Aspirin.  6. Coumadin.  7. Glipizide.  8. As well as some noncardiac medications.   On examination today, the blood pressure was 135/79 with a pulse of 51  and regular. There was no venous distention. Carotid pulses were full  without bruits.  CHEST:  Was clear without rales or rhonchi.  CARDIAC:  Rhythm was regular. I could hear no murmurs or gallops.  The abdomen was soft with normal bowel sounds. The abdomen was quite  protuberant.  Peripheral pulses were full, and there was no peripheral edema.   An ECG showed nonspecific ST-T changes in sinus rhythm.   IMPRESSION:  1. Coronary artery disease status post multiple percutaneous      interventions in all three vessels, the last of which were with      drug-eluting stents.  2. Good left ventricular function.  3. Paroxysmal  atrial fibrillation.  4. Hypertension.  5. Hyperlipidemia.   RECOMMENDATIONS:  I think Darryl Carroll is stable from a cardiac  standpoint. I encouraged him to work on loosing weight. He has  difficulty exercising due to his scoliosis. He had recent labs at the  [facility], and he is going to get those results to [place]. We will plan  to see him back in cardiac follow up here in a year.     Bruce Elvera Lennox Juanda Chance, MD, [facility]  Electronically Signed    BRB/MedQ  DD: 05/12/2006  DT: 05/12/2006  Job #: [id number]   cc:   [name], M.D.

## 2010-07-26 NOTE — Discharge Summary (Signed)
. [facility]  Patient:    Darryl Carroll, Darryl Carroll                     MRN: [id number] Adm. Date:  [id number] Disc. Date: [id number] Attending:  Lenoria Farrier Dictator:   Joellyn Rued, P.A.C.                  Referring Physician Discharge [facility]  DATE OF BIRTH:  02-23-1928  SUMMARY OF HISTORY:  Darryl Carroll is a 77 year old white male who presented with intermittent chest discomfort for the proceeding two weeks.  He described it as a left-sided aching sensation without radiation, associated shortness of breath, nausea, vomiting, or diaphoresis.  He felt that it was relieved by nitroglycerin.  On the day of admission the discomfort began after he was awakened.  Medical history is notable for angioplasty of two circumflex lesions in 1987, last catheterization in 1995 showed a 40% RCA, 40% circumflex, 90% OM-1.  Stress test in 1995 showed possible inferior ischemia. His last Cardiolite at [facility] was July 2001 and was reportedly okay.  He also has a history of hyperlipidemia.  LABORATORY DATA:  Chest x-ray did not show any active disease.  H&H was 14.9 and 43.5, normal indices, platelets 197, wbcs 7.9.  Sodium 137, potassium 3.6, BUN 14, creatinine 0.8, glucose 74.  CKs and troponins were negative for myocardial infarction.  Subsequent CBCs and chemistry were unchanged.  EKG on admission showed atrial fibrillation, nonspecific ST-T wave changes.  HOSPITAL COURSE:  It is felt that his discomfort was somewhat atypical but the patient is a poor historian and not very active.  His EKG did show atrial fibrillation of unknown duration.  Overnight he did not have any further discomfort and he remained in atrial fibrillation with a ventricular rate between 70 and 130s.  Consideration was given to increasing Tenormin for better rate control.  Cardiac catheterization performed on February 19 by Dr. Juanda Chance revealed a 40% proximal RCA lesion, a 60% proximal  circumflex lesion, 80-90% OM-1 lesion, a 40/40% proximal LAD lesion, 50% diagonal one lesion with normal LV function, EF 60%, without peripheral vascular disease.  Post catheterization, catheterization site was closed with Perclose.  Post bedrest he was ambulating without difficulty, catheterization site was intact, and it was felt that he could be discharged home.  DISCHARGE DIAGNOSES: 1. Chest discomfort of unknown etiology. 2. Atrial fibrillation of unknown duration. 3. Nonobstructive coronary artery disease. 4. History as previously described.  DISPOSITION:  He is discharged home.  MEDICATION ADJUSTMENTS: 1. Baby aspirin was decreased to 81 mg q.d. 2. Coumadin 5 mg was added - he was instructed to take one-and-a-half tablets    every day for three days before 4 and 6 p.m.  He will have a PT/INR checked    on Friday, February 22 at 2 p.m. 3. His atenolol also was increased to 50 mg daily.  OTHER MEDICATIONS:  He was asked to continue his other medications which include: 1. Lozol 1.25 q.d. 2. Zocor 20 q.h.s. 3. Cardura 2 mg q.d. 4. Allopurinol 300 mg q.d. 5. Celexa 10 mg q.d. 6. Albuterol MDI four times a day and as needed. 7. Allegra 180 mg q.d. 8. Voltaren 50 mg b.i.d. 9. Sublingual nitroglycerin as needed.  ACTIVITY:  He was advised no lifting, soaking, or tub bathing for one week. No driving, sexual activity, or heavy exertion for two days.  DIET:  Maintain low salt/fat/cholesterol diet.  WOUND CARE:  He was given permission to remove the Perclose dressing and shower on February 20.  If any problems with his catheterization site, he was instructed to call the office immediately.  FOLLOW-UP:  He was asked to bring all his medicines to Dr. Delia Chimes appointment on May 20, 2000 at 2:45 p.m. DD:  04/28/00 TD:  04/29/00 Job: [id number]

## 2010-07-26 NOTE — Discharge Summary (Signed)
NAME:  Darryl Carroll                        ACCOUNT NO.:  [id number]   MEDICAL RECORD NO.:  [id number]                   PATIENT TYPE:  INP   LOCATION:  6525                                 FACILITY:  [facility]   PHYSICIAN:  Rollene Rotunda, M.D.                DATE OF BIRTH:  1927/07/19   DATE OF ADMISSION:  05/25/2003  DATE OF DISCHARGE:  05/30/2003                           DISCHARGE SUMMARY - REFERRING   DISCHARGE DIAGNOSES:  1. Chest pain, resolved.  2. Coronary artery disease, history of myocardial infarction in the past as     well as multiple percutaneous interventions.  3. History of ventricular fibrillation in the setting of myocardial     infarction in the past.  4. Benign prostatic hypertrophy.  5. Obstructive sleep apnea.  6. Continuous positive airway pressure at night.  7. Degenerative joint disease.  8. Hyperlipidemia.  9. Obesity.  10.      History of atrial fibrillation.  11.      Long-term Coumadin therapy.   HOSPITAL COURSE:  Darryl Carroll is a 77 year old male patient with a known  history of coronary artery disease.  On the day of admission, he had onset  of left lower chest pain at 9:00 a.m.  He states that this pain was similar  to symptoms during a balloon inflation during percutaneous intervention in  the past.  He did complain of dizziness at times, although no frank syncope.  Essentially, the patient ruled out for myocardial infarction via cardiac  enzymes.  EKG revealed normal sinus rhythm with nonspecific ST-T wave  changes, especially in the lateral leads.   He underwent cardiac catheterization on May 26, 2003, and was found to  have the following:  Right coronary artery with a proximal 70% lesion  followed by a distal 50% lesion, circumflex with 70-80% midstenosis, LAD  with an 80% followed by a 60% stenosis, first diagonal had a 90% lesion.  On  May 29, 2003, the patient underwent Taxus Express 2 stent placement to the  LAD reducing the 80%  lesion to less than 10% postprocedure, diagonal 95%  reduced to 30% postprocedure, and the right coronary artery 80% reduced to  less than 20% postprocedure.  Dr. Allyson Sabal performed this __________ procedure  and felt that the patient would be ready for discharge the following day but  needed to return for angioplasty and intervention of the circumflex lesion  but also has an 80% ostial stenosis.  This has been set up for June 01, 2003, at 9:30 a.m.  The patient is to show up at 7:30 a.m. and has been  instructed not to eat after midnight.   The patient is instructed to continue same medications as prior to admission  except no Coumadin.   DISCHARGE MEDICATIONS:  1. We have added Plavix 75 mg a day.  2. We have added enteric-coated aspirin 325 mg a day.  3. Tricor 145 mg  a day.  4. Crestor 10 mg a day.  5. Sublingual nitroglycerin p.r.n. pain.  6. Desyrel 150 mg q.h.s.  7. Albuterol MDI.  8. __________ 300 mg a day.  9. Atenolol 50 mg a day.  10.      Celexa 40 mg a day.  11.      Diapamide 2.5 mg a day.  12.      Lisinopril 20 mg a day.  13.      Claritin 10 mg a day.  14.      Multivitamin daily.  15.      Omeprazole 20 mg a day.  16.      Terazosin 2 mg a day  17.      Tylenol No. 3 p.r.n.      Guy Franco, P.A. [facility]                      Rollene Rotunda, M.D.    Arlana Hove  D:  05/30/2003  T:  05/31/2003  Job:  [id number]   cc:   Charlies Constable, M.D. [facility]   [name], M.D.  [address] [place], [place] [address]  Fax: [phone]

## 2010-07-26 NOTE — Cardiovascular Report (Signed)
NAME:  Darryl Carroll, Darryl Carroll                        ACCOUNT NO.:  [id number]   MEDICAL RECORD NO.:  [id number]                   PATIENT TYPE:  INP   LOCATION:  6525                                 FACILITY:  [facility]   PHYSICIAN:  Charlies Constable, M.D. [facility]              DATE OF BIRTH:  [birth year]   DATE OF PROCEDURE:  05/29/2003  DATE OF DISCHARGE:  05/30/2003                              CARDIAC CATHETERIZATION   PROCEDURE PERFORMED:  Cardiac catheterization.   CARDIOLOGIST:  Charlies Constable, M.D.   CLINICAL HISTORY:  Darryl Carroll is 77 years old and has had a remote  circumflex artery treated with PTCA.  He was admitted with unstable angina  and cathed on Friday by Dr. Riley Kill, and was found to have 80% in the LAD,  95% in the diagonal branch (branch stenosis), 80% stenosis in the circumflex  artery with 90% stenosis in the ostium of the marginal branch, and 80%  stenosis in the proximal right coronary artery.  After some debate we  decided to treat him percutaneously.   DESCRIPTION OF PROCEDURE:  The procedure was performed via the left femoral  artery using arterial sheath and a 6French Q-4 guiding catheter.  We used a  PT-2 wire and crossed the lesion in the proximal LAD without too much  difficulty.  We IVUS'd the LAD and found there were two areas of focal  stenosis with minimal luminal diameters of about 1.6-1.8 just before the  diagonal branch and just after the septal perforator.  We next wired the  diagonal branch and treated this was a 2.0 x 20 mm balloon performing two  inflations up to eight atmospheres for 30 seconds.  We had previously  predilated the LAD with a 2.5 x 20 mm Quantum Maverick with two inflations  up to eight atmospheres for 30 seconds.  We then deployed a 2.75 x 24 mm  TAXUS stent crossing the diagonal branch and septal perforators, and  deployed with one inflation up to 14 atmospheres for 30 seconds.  We then  post dilated the distal portion of this stent with a  3.25 x 8 mm Quantum  Maverick where we felt the vessel was larger and the stent might not be  completely apposed.   We did not compromise flow in the diagonal branch after stenting the main  channel.   We next turned our attention to the right coronary artery.  We initially  used a JR-4 guiding catheter with side holes and a PT-2 wire.  We switched  to a Trooper wire thinking this would straighten the vessel less so that we  could get a adequate IVUS measurement.  There was marked calcification and  tortuosity in the proximal vessel before an 80% stenosis.   We then performed an IVUS run, but were unable to pass the IVUS catheter  down to the lesion.  The proximal reference appeared to be about 2.5 mm in  diameter with about 270-degrees of calcium.  We then predilated with a 2.5 x  8 mm Quantum Maverick performing one inflation up to 10 atmospheres for 30  seconds.  We then deployed a 2.5 x 12 mm TAXUS stent, deploying this with  one inflation of 15 atmospheres for 30 seconds.  We post dilated the distal  aspect of this stent with a 2.5 x 8 mm Quantum Maverick balloon with one  inflation up 16 atmospheres for 30 seconds.   Repeat diagnostic studies were then performed through the guiding catheter.   The patient tolerated the procedure well and left the laboratory in  satisfactory condition.   RESULTS:  Left Anterior Descending:  The LAD stenosis was initially 80% and  there were two tandem stenoses.  Following stenting these improved to less  than 10%.   Diagonal:  The diagonal stenosis was 95%.  Following PTCA this improved to  30%.   Right Coronary Artery:  The right coronary artery had an 80% proximal  stenosis and this improved to less than 20% after stenting.   CONCLUSION:  1. Successful stenting of the left anterior descending and percutaneous     transluminal coronary angioplasty of a diagonal branch (branch stenosis)     using a drug-eluting stent with improvement in  the left anterior     descending from 80% to less than 10% and improvement in the diagonal from     95 to 30%.  2. Successful stenting of the lesion in the proximal right coronary artery     with a TAXUS stent with improvement in percent narrowing from 80% to less     than 20%.   DISPOSITION:  The patient was returned to his room for further observation.  We will plan to let the patient go home tomorrow and return on Thursday for  intervention on the circumflex artery.                                               Charlies Constable, M.D. [facility]    BB/MEDQ  D:  05/29/2003  T:  05/31/2003  Job:  [id number]   cc:   [name], M.D.  [address]  [place], [place] [address]  Fax: [phone]   Cardiopulmonary Laboratory

## 2010-07-26 NOTE — Discharge Summary (Signed)
NAME:  Darryl Carroll, Darryl Carroll                        ACCOUNT NO.:  [id number]   MEDICAL RECORD NO.:  [id number]                   PATIENT TYPE:  OIB   LOCATION:  2903                                 FACILITY:  [facility]   PHYSICIAN:  Charlies Constable, M.D. [facility]              DATE OF BIRTH:  [birth year]   DATE OF ADMISSION:  06/01/2003  DATE OF DISCHARGE:  06/02/2003                                 DISCHARGE SUMMARY   DISCHARGE DIAGNOSES:  1. Coronary artery disease, status post Taxus stenting to the left anterior     descending coronary artery and a percutaneous coronary angiography of the     first diagonal, and Taxus stenting to the proximal right coronary artery     on May 29, 2003,     a. Residual circumflex coronary artery disease.     b. Normal left ventricular function with an ejection fraction of 60%.  2. Status post attempted percutaneous coronary intervention of the     circumflex this admission, complicated by possible dye allergy reaction.  3. History of ventricular fibrillation arrest in the setting of a myocardial     infarction in the past.  4. Benign prostatic hypertrophy.  5. Obstructive sleep apnea.  6. Degenerative joint disease.  7. Treated dyslipidemia.  8. Obesity.  9. History of atrial fibrillation     a. On long-term Coumadin therapy.     b. Coumadin on hold, secondary to upcoming percutaneous coronary        intervention.  10.      Hypertension.  11.      History of peptic ulcer disease.   PROCEDURE:  An attempted percutaneous coronary intervention of the  circumflex coronary artery, as noted above.   HISTORY OF PRESENT ILLNESS:  Please see the discharge summary from May 30, 2003, for complete details.  Briefly, this 77 year old male presented to  [facility] on May 25, 2003, with complaints of chest  discomfort.   HOSPITAL COURSE:  He underwent a cardiac catheterization revealing three-  vessel coronary artery disease.  He underwent an  intervention of the LAD and  diagonal and the RCA.  He was sent home on May 30, 2003, and brought back  on June 01, 2003, for a planned intervention of the circumflex.  During the  procedure, the patient became hypotensive, and the procedure was aborted.  It was felt that the patient may be having a reaction to the IV dye.  The  decision was made to discharge the patient to home on the next day, and  bring him back in one week's time for an attempt at a repeat procedure.  He  would need pre-treatment for a possible dye allergy prior to his procedure.   LABORATORY DATA:  Sodium 138, potassium 3.3, chloride 105, CO2 of 25,  glucose 132, BUN 21, creatinine 1.3.  White count 6700, hemoglobin 13.4,  hematocrit 37.7, platelet  count 170,000.   DISCHARGE MEDICATIONS:  1. Plavix 75 mg daily.  2. Aspirin 325 mg daily.  3. Tri-Chlor 145 mg daily.  4. Crestor 10 mg daily.  5. Nitroglycerin p.r.n. chest pain.  6. Trazodone 150 mg q.h.s.  7. Albuterol.  8. Allopurinol 300 mg daily.  9. Tylenol 25 mg daily.  10.      Celexa 40 mg daily.  11.      Indapamide 2.5 mg daily.  12.      Lisinopril 20 mg daily.  13.      Omeprazole 20 mg daily.  14.      Claritin p.r.n.  15.      Multivitamin.  16.      For pain management Tylenol p.r.n.  17.      Nitroglycerin p.r.n. chest pain.  18.      The patient has been given a prescription for prednisone.  He is to     take this medication beginning on Thursday, June 08, 2003, in     preparation for his cardiac catheterization and percutaneous coronary     intervention on June 09, 2003.  19.      The patient has been given a prescription for Pepcid.  He is to     take this medication beginning on Thursday, June 08, 2003, in     preparation for his cardiac catheterization and percutaneous coronary     intervention on June 09, 2003.   DISCHARGE INSTRUCTIONS:  1. He is to call our office or 911 for recurrent chest pain.  2. No driving, lifting, heavy  lifting, exertion.  3. No work for three days.  4. Diet:  A low-fat, low-sodium diet.  5. Wound care:  The patient is to call our office in [facility] for any     groin swelling, bleeding, or bruising.   FOLLOWUP:  The patient is scheduled for a percutaneous coronary intervention  on June 09, 2003, with Dr. Charlies Constable at 9:30 a.m.  He has been instructed  to arrive at the short-stay center at [facility]. [facility],  [address] at 7:30 a.m.  Our cardiac catheterization assistant has the orders  and paper work.  She should be paged at the time of the patient's arrival to  the short-stay center.      Tereso Newcomer, P.A.                        Charlies Constable, M.D. [facility]    SW/MEDQ  D:  06/02/2003  T:  06/02/2003  Job:  [id number]   cc:   [name], M.D.  [address] [place], [place] [address]  Fax: [phone]

## 2010-07-26 NOTE — Discharge Summary (Signed)
[place]. [facility]  Patient:    Darryl Carroll, Darryl Carroll                     MRN: [id number] Adm. Date:  [id number] Disc. Date: [id number] Attending:  Lenoria Farrier Dictator:   Joellyn Rued, P.A.C. CC:         Dr. Ann Maki T. Stoneking  Dr. Sharlyn Bologna with [facility].   Referring Physician Discharge [place]  DATE OF BIRTH:  [birth year]  SUMMARY OF HISTORY:  Mr. Prichett is a 77 year old white male who has a history of a posterior MI complicated by ventricular fibrillation treated with angioplasty in 1996.  He moved to [facility] and had angioplasty for restenosis in 1997.  He has done well until the morning of May 20, 2000; while doing exercises at the [facility] he developed substernal chest discomfort which resembled the pain with his last angioplasty.  With nitroglycerin, the discomfort was relieved within 10 minutes.  He has a history of hyperlipidemia, previous prostate surgery, gout.  He was recently started on Coumadin for atrial fibrillation.  HOSPITAL COURSE:  Mr. Wiechman underwent cardiac catheterization by Dr. Juanda Chance on March 15.  According to his progress notes, he had a 40% proximal RCA, 50% mid RCA, 50% proximal LAD with calcification, 50-70% proximal calcified circumflex, 50-70% mid circumflex, 80% OM.  His ejection fraction was 60% without wall motion abnormalities.  Dr. Juanda Chance felt that there were not any significant changes and considered that the OM could be the source of symptoms but not favorable for intervention, and recommended outpatient rest/stress Cardiolite for evaluation of possible ischemia.  Post sheath removal and bedrest he was ambulating the hall without difficulty.  Catheterization site was intact.  On the morning of March 16, after reviewing, Dr. Eden Emms felt that he could be discharged home.  DISCHARGE DIAGNOSES: 1. Chest discomfort, unknown etiology. 2. Coronary artery disease. 3. History as previously  described.  DISPOSITION:  He is discharged home.  MEDICATIONS:  He is asked to resume his home medications.  These include: 1. Lozol 2.5 mg one-half tablet q.d. 2. Zocor 20 mg one-half tablet q.h.s. 3. Atenolol 25 mg two tablets q.d. 4. Cardura 2 mg q.d. 5. Allopurinol 300 mg q.d. 6. Coumadin 5 mg one tablet every day except one-half tablet on Monday and    Friday. 7. Baby aspirin 81 mg q.d. 8. Voltaren, Allegra, Celexa as previously. 9. Sublingual nitroglycerin as needed.  ACTIVITY:  He was advised no lifting, driving, sexual activity, or heavy exertion for two days.  DIET:  Maintain low salt/fat/cholesterol diet.  WOUND CARE:  If he had any problems with his catheterization site, he was instructed to call.  FOLLOW-UP:  He will have his PT/INR checked on Thursday at the Coumadin clinic and on Monday he was asked to call our office to arrange a rest/stress Cardiolite with a follow-up appointment with Dr. Juanda Chance for further evaluation of his coronary disease. DD:  05/23/00 TD:  05/23/00 Job: [id number]

## 2010-07-26 NOTE — Cardiovascular Report (Signed)
[place]. [facility]  Patient:    Darryl Carroll, Darryl Carroll                     MRN: [id number] Proc. Date: 04/28/00 Adm. Date:  [id number] Disc. Date: [id number] Attending:  Lenoria Farrier CC:         Cardiopulmonary Laboratory   Cardiac Catheterization  PROCEDURES PERFORMED:  Cardiac catheterization.  CLINICAL HISTORY:  Mr. Heeney is 77 years old and has documented coronary disease with previous PTCA of the circumflex artery in 1987 and residual circumflex marginal disease at last catheterization in 1995.  He recently has had recurrent chest pain which has been somewhat atypical for ischemia and came to the emergency room on February 18 with palpitations and atrial fibrillation.  He was put on heparin and Cardizem and converted to sinus rhythm this morning and had been scheduled for catheterization today.  DESCRIPTION OF PROCEDURE:  The procedure was performed via the right femoral artery using an arterial sheath and 6 French preformed coronary catheters.  A front wall arterial puncture was performed and Omnipaque contrast was used. Distal aortogram was performed to rule out abdominal aortic aneurysm.  The right femoral artery was closed with Perclose at the end of the procedure. The patient tolerated the procedure well and left the laboratory in satisfactory condition.  RESULTS:  The left main coronary artery:  The left main coronary artery was free of significant disease.  Left anterior descending:  The left anterior descending artery gave rise to two diagonal branches and a septal perforator.  There were tandem 40% stenosis in the proximal LAD.  There was 50% ostial narrowing of the second diagonal branch.  Circumflex artery:  The circumflex artery gave rise to a marginal branch and a posterolateral branch.  There was 60% proximal stenosis.  There was 80-90% ostial stenosis of the marginal branch.  Right coronary artery:  The right coronary is a  moderate sized vessel that gave rise to a right ventricular branch, a posterior descending branch and three small posterolateral branches.  There was a long area of segmental disease in the proximal vessel with 40-50% focal narrowing.  LEFT VENTRICULOGRAPHY:  The left ventriculogram was performed in the RAO projection showed good wall motion with no areas of hypokinesis.  The estimated ejection fraction was 60%.  DISTAL AORTOGRAM:  Distal aortogram was performed which showed patent renal artery stenosis and no significant aortoiliac obstruction.  The aortic pressure is 126/65 with a mean of 91.  Left ventricular pressure is 126/18.  CONCLUSIONS:  Coronary artery disease, status post prior PTCA of the circumflex artery in 1987 with 40% tandem lesion in the proximal LAD, 60% stenosis in the proximal circumflex artery with 80-90% stenosis at the ostium of the first marginal branch, 50% narrowing in the proximal right coronary artery and normal LV function.  RECOMMENDATIONS:  Mr. Salinger has primarily nonobstructive disease and it looks like the lesions have not changed much from the description of his previous catheterization.  I think this can be managed medically.  We will plan to start him on Coumadin for his atrial fibrillation and followup with him in the office. DD:  04/28/00 TD:  04/29/00 Job: [id number]

## 2010-07-26 NOTE — Cardiovascular Report (Signed)
NAME:  Darryl Carroll, Darryl Carroll                        ACCOUNT NO.:  [id number]   MEDICAL RECORD NO.:  [id number]                   PATIENT TYPE:  INP   LOCATION:  6524                                 FACILITY:  [facility]   PHYSICIAN:  Charlies Constable, M.D. [facility]              DATE OF BIRTH:  02-22-1928   DATE OF PROCEDURE:  06/09/2003  DATE OF DISCHARGE:  06/10/2003                              CARDIAC CATHETERIZATION   CLINICAL HISTORY:  Mr. Lauderback is 77 years old and was recently admitted  with unstable angina, found to have three-vessel coronary disease.  We  debated treatment and decided to do three-vessel PCI.  He had the right  coronary and LAD stented previously and he had been brought back a week ago  for attempt at rotational atherectomy and stenting of the circumflex artery,  but he developed profound hypotension and the procedure was aborted.  We  were not certain whether this was related to contrast reaction, the  Rotablator or both.  We never actually rotablated the lesion and he became  hypotensive just with platforming the Rotablator.  We premedicated him with  steroids, H2 blockers and Pepcid and plan to do the procedure without the  Rotablator today.   PROCEDURE:  The procedure was performed via the right femoral artery using  arterial sheath and initially a 6 French CLS 4.0 guiding catheter.  We used  a PT2 light support wire and passed the wire into the marginal side branch  where we planned to treat an ostial lesion.  This was a branch stenosis and  we first started with the ostial portion of the marginal branch.  We  initially dilated with a 2.0 x 20-mm Maverick performing two inflations up  to 8 atmospheres for 30 seconds.  We then attempted to pass a 2.25 x 10  cutting balloon, but were not able to pass this past bends and calcification  of the proximal vessel.  We tried to pass a 3.25 x 20-mm Quantum Maverick  down the AV circumflex and were unable to do this as well.  We  attempted to  IVUS the lesion prior to this, but were unable to pass the IVUS around the  proximal bends and calcification.  At this point, we switched to a 7 [place]  system with a moderate support PT2 wire.  With the help of this, we were  able to pass the 3.25 x 12-mm Quantum Maverick down the AV circumflex.  We  performed dilatation and the lesion finally broke at 12 atmospheres.  We  then were able to get a 2.5 x 6-mm cutting balloon into the ostium of the  circumflex artery and performed three inflations up to 6 atmospheres for 30  seconds.  Following this, we realized that there was a dissection that  extended past the marginal branch and that the lesion in the AV circumflex  would require more than one stent.  We first passed a 3.0 x 23-mm Cypher  stent down to the distal vessel past the marginal branch and deployed this  with one inflation of 10 atmospheres for 30 seconds and a second inflation  of 14 atmospheres for 30 seconds with the balloon inside the distal edge.  We then deployed a 3.5 x 23-mm Cypher overlapping the first stent, but not  crossing the marginal branch.  We deployed this with one inflation of 12  atmospheres for 30 seconds and a second inflation of 15 atmospheres with the  balloon inside the proximal edge.  Repeat diagnostic studies were then  performed through the guiding catheter.   Procedure was a long and complicated procedure due to difficult access, the  need to treat a bifurcation lesion and a dissection requiring an additional  stent.  The total duration of the procedure was two hours.  Despite this,  The patient tolerated the procedure well and left the laboratory in  satisfactory condition.   RESULTS:  Initially, the stenosis in the mid circumflex artery were 80 and  90%.  Following stenting, this improved to 0%.  The stenosis in the ostium  of the marginal branch which arose from the lesion was initially 90%.  This  improved to about 30% following  cutting balloon angioplasty, but was tighter  after stent deployment and ended at 70%.   CONCLUSION:  Successful treatment of a bifurcation lesion in the circumflex  and marginal branch with improvement in the main channel from 90% to 0% and  improvement with residual 70% stenosis in the marginal side branch.   DISPOSITION:  The patient was returned to the postangioplasty unit for  further observation.                                               Charlies Constable, M.D. [facility]    BB/MEDQ  D:  06/09/2003  T:  06/10/2003  Job:  [id number]   cc:   [name], M.D.  [address] [place], [place] [address]  Fax: [phone]

## 2010-07-26 NOTE — H&P (Signed)
NAME:  Darryl Carroll, Darryl Carroll                        ACCOUNT NO.:  [id number]   MEDICAL RECORD NO.:  [id number]                   PATIENT TYPE:  INP   LOCATION:  3703                                 FACILITY:  [facility]   PHYSICIAN:  Darryl Carroll, M.D.                DATE OF BIRTH:  10-Jun-1927   DATE OF ADMISSION:  05/25/2003  DATE OF DISCHARGE:                                HISTORY & PHYSICAL   CHIEF COMPLAINT:  Chest pain.   HISTORY OF PRESENT ILLNESS:  Darryl Carroll is a 77 year old male with a  history of coronary artery disease whose last catheterization was in  December 2003.  He had onset of lower left chest pain at approximately 9  a.m.  Denied his previous cardiac symptoms, but he had __________ symptoms  during balloon inflation during his last percutaneous intervention.  The  pain reached a 5/10 and lasted approximately 30 minutes.  He took sublingual  nitroglycerin x 1which relieved the pain.  It returned two or three times  later on in the morning but each time lasted only a few minutes and not long  enough for him to take any nitroglycerin.  He denied any syncope, although  he does complain of some dizziness at times.  The dizziness is mostly  orthostatic and resolves quickly.  He has never fallen or felt like he was  going to fall secondary to this.  He lives at the [facility] and went to  the clinic there at which time he saw an Charity fundraiser.  She contacted our office once  she did an EKG, and he was sent to the emergency room.  It was noted on the  EKG his heart rate was in the low 50s.  He is in sinus rhythm.   PAST MEDICAL HISTORY:  1. MI with percutaneous coronary intervention in 1986 and 1996.  2. Status post cardiac catheterization in December 2003 at which time the     LAD had a 70% and a 40% lesion.  The first diagonal was totalled.  The     circumflex had a 70% stenosis.  The OM-1 had an 80% stenosis, and the     dominant RCA had a 40 and 70% stenoses.  His EF was 60%.   Medical therapy     was recommended.  3. History of hypertension.  4. History of hyperlipidemia.  5. Obesity.  6. Family history of coronary artery disease.  7. History of peptic ulcer disease secondary to NSAIDs.  8. History of atrial fibrillation.  9. History of ventricular fibrillation in setting of an MI.  10.      He is chronically anticoagulated with Coumadin.  11.      History of BPH.  12.      History of obstructive sleep apnea on CPAP.  13.      Degenerative joint disease.   PAST SURGICAL HISTORY:  1. 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  2. Bilateral carpal tunnel surgery.  3. Four or five cardiac catheterizations.  4. TURP.   ALLERGIES:  He is intolerant to NSAIDS secondary to ulcer disease.   MEDICATIONS:  1. Coumadin 5 mg tablets, 1/2 tablet 6 days a week with 1 tablet on Sunday.  2. Tricor 145 mg daily.  3. Crestor 10 mg daily.  4. Nitroglycerin p.r.n.  5. Desyrel 150 mg q.h.s.  6. Albuterol MDI 90 mcg p.r.n.  7. Allopurinol 300 mg daily.  8. Atenolol 50 mg daily.  9. Celexa 40 mg daily.  10.      Diapamide 2.5 mg daily.  11.      Lisinopril 20 mg daily.  12.      Loratadine 10 mg daily.  13.      Multivitamins daily.  14.      Omeprazole 20 mg daily.  15.      Terazosin 2 mg daily.  16.      Tylenol with codeine p.r.n.   FAMILY HISTORY:  His mother died at age 43 with diabetes, and his father  died at age 64 with an MI.  His brother died at age 27 with an MI.   REVIEW OF SYSTEMS:  Significant for chest pain as described above.  He has  chronic dyspnea on exertion with no recent change.  He denies any orthopnea,  PND, edema, or any recent palpitations.  He is not currently having any  genitourinary symptoms.  He has chronic arthralgias.  He denies any GI  symptoms.  Review of Systems is otherwise negative.   PHYSICAL EXAMINATION:  VITAL SIGNS:  Temperature 97.3, blood pressure  124/76, pulse 48, respiratory rate 22, O2 saturation  100% on room air.  GENERAL:  He is a well-developed, well-nourished white male, slightly obese,  in no acute distress.  HEENT:  Normocephalic and atraumatic.  Pupils equal, round, and reactive to  light and accommodation.  Sclerae are clear.  Nares without discharge.  NECK:  Supple and without lymphadenopathy, thyromegaly, bruit, or JVD.  CARDIOVASCULAR:  Heart is regular rate and rhythm although slow, and he has  an S1 and S2 without significant murmur, rub, or gallop.  Distal pulses were  2+, and no bruits were appreciated.  LUNGS: He has a few rales in the bases.  SKIN:  No rashes or lesions are noted.  ABDOMEN:  Soft and nontender.  Active bowel sounds are noted.  There is no  hepatosplenomegaly noted.  EXTREMITIES:  He has no significant cyanosis, clubbing, or edema.  There is  no joint deformity for effusions.  MUSCULOSKELETAL:  No spine or CVA tenderness.  NEUROLOGIC:  He is alert and oriented x 3 with cranial nerves II-XII grossly  intact, 5/5 age-appropriate strength.   LABORATORY AND X-RAY DATA:  Chest x-ray is pending.   EKG: Sinus bradycardia with rate of 48 and no acute changes.  There is no  significant change except for a slightly decreased heart rate from an EKG in  June 2004 at which time his heart rate was 64.   Point-of-care markers are negative x 2, and other laboratory values are  pending.   IMPRESSION AND PLAN:  1. Chest pain.  Relieved with substernal nitroglycerin and similar to pain     he had in the past at time of percutaneous intervention.  He had multiple     coronary artery lesions on previous catheterization.  Other data was  reviewed.  It was felt this was possibly unstable anginal pain, and he     will be admitted to rule out myocardial infarction.  Heparin if INR is     less than 2, and add IV nitroglycerin.  Add Integrillin if his enzymes    are positive, and catheterization when his INR is less than 1.7.  2. Bradycardia.  Decrease his beta  blocker and follow.  3. He is otherwise stable.  See Past Medical History.   This is Darryl Carroll, P.A. dictating for Dr. Antoine Carroll who saw the patient  and determined the plan of care.      Darryl Carroll, P.A. [facility]                  Darryl Carroll, M.D.    RB/MEDQ  D:  05/25/2003  T:  05/27/2003  Job:  [id number]

## 2010-07-26 NOTE — Op Note (Signed)
NAME:  Darryl Carroll, LOEWE                        ACCOUNT NO.:  [id number]   MEDICAL RECORD NO.:  [id number]                   PATIENT TYPE:  OIB   LOCATION:  2899                                 FACILITY:  [facility]   PHYSICIAN:  Jefry H. Pollyann Kennedy, M.D.                DATE OF BIRTH:  09-23-1927   DATE OF PROCEDURE:  04/19/2003  DATE OF DISCHARGE:                                 OPERATIVE REPORT   PREOPERATIVE DIAGNOSIS:  Thyroglossal duct cyst.   POSTOPERATIVE DIAGNOSIS:  Thyroglossal duct cyst.   OPERATION PERFORMED:  Sistrunk procedure.   SURGEON:  Jefry H. Pollyann Kennedy, M.D.   ANESTHESIA:  General endotracheal.   COMPLICATIONS:  None.   ESTIMATED BLOOD LOSS:  Minimal.   OPERATIVE FINDINGS:  A large complex cystic mass anterior neck centrally  located between the thyroid notch and the hyoid bone.  Contents of the cyst  were not visualized during the procedure.  Specimen sent for pathologic  evaluation.   REFERRING PHYSICIAN:  [name], M.D.   INDICATIONS FOR PROCEDURE:  The patient is a 77 year old gentleman who has a  several month history of a slowly enlarging anterior neck mass.  CT scan  revealed a cystic mass.  The risks, benefits, alternatives and complications  of the procedure were explained to the patient and his family, who  seemed  to understand and agreed to surgery.   DESCRIPTION OF PROCEDURE:  The patient was taken to the operating room and  placed on the operating table in the supine position.  Following induction  of general endotracheal anesthesia, the anterior neck was prepped and draped  in the standard fashion. A cervical skin crease was marked with a marking  pen in a transverse fashion for a proposed incision.  Electrocautery was  used to incise the skin and subcutaneous tissue.  A flap was developed  superiorly up to the level of the hyoid bone.  A self-retaining retractor  was used to maintain the flap location.  The lesion was dissected free of  the  surrounding strap musculature.  It was dissected up superiorly off of  the thyrohyoid membrane.  The thyrohyoid membrane was left intact.  The cyst  wall was kept intact.  The superior dissection was accomplished down to the  central portion of the hyoid bone.  The suprahyoid musculature was divided  off of the central portion of the bone between the lesser cornu bilaterally.  The hyoid was cut using bone cutting rongeur just lateral to the lesser  cornu bilaterally.  The specimen was then dissected off of the anterior  pharyngeal mucosa and the lesion was sent for pathologic evaluation.  Silk  ties and cautery were used for hemostasis.  The wound was irrigated with  saline and closed in layers using 3-0 chromic on the deep layer and running  5-0 nylon on the skin.  A  7 French Jackson-Pratt drain was left  in the wound exiting through the left  side of the incision and secured in place with nylon suture.  The patient  was then awakened from anesthesia, extubated and transferred to recovery in  stable condition.                                               Jefry H. Pollyann Kennedy, M.D.    JHR/MEDQ  D:  04/19/2003  T:  04/19/2003  Job:  [id number]   cc:   [name], M.D.  [address] [place], [place] [address]  Fax: [phone]

## 2010-07-26 NOTE — Cardiovascular Report (Signed)
[place]. [facility]  Patient:    Darryl Carroll, Darryl Carroll                     MRN: [id number] Proc. Date: 05/22/00 Adm. Date:  [id number] Disc. Date: [id number] Attending:  Lenoria Farrier CC:         Cath Lab   Cardiac Catheterization  INDICATIONS:  Mr. Nazareno is 77 years old.  He had a remote circumflex infarct complicated by ventricular fibrillation which we treated with primary angioplasty.  He then moved to [facility] and subsequently had a PCI for restenosis by Dr. Lottie Rater.  He has done well since that time, but was admitted about a month ago with atrial fibrillation and chest pain.  He underwent catheterization and was found to have 60% stenosis in the proximal circumflex artery and 80% stenosis in a marginal branch.  We elected to treat him medically and put him on Coumadin for his atrial fibrillation, although, he had converted to sinus rhythm.  I saw him in the office two days ago with symptoms of recurrent chest pain very similar to what he had when he had the balloon inflated by Dr. Lottie Rater.  We were concerned about these symptoms and arranged for him to come in for further evaluation with angiography.  PROCEDURE:  The procedure was performed via the left femoral artery using arterial sheath and 6 French preformed coronary catheters.  A front wall arterial puncture was performed and Omnipaque contrast was used.  At the end of the procedure, we attempted to close the left femoral artery with Perclose, but the suture broke and we had to use manual compression.  We were able to remove the suture.  RESULTS:  The left main coronary artery was free of significant disease.  The left anterior descending artery gave rise to three septal perforators and a diagonal branch.  There 50% narrowing in the proximal LAD.  The circumflex artery gave rise to a marginal branch and a posterolateral branch.  There were tandem 50/70% stenoses in the proximal to  midvessel. There was an 80% ostial stenosis in the marginal branch, although, this was not a very large vessel.  The right coronary artery was a moderate size vessel that gave rise to an acute marginal branch, a posterior descending branch, and a posterolateral branch.  There was 40% proximal segmental stenosis, and there was a 50% focal stenosis in the midvessel.  The left ventriculogram performed in the RAO projection showed good wall motion with no areas of hypokinesis.  The estimated ejection fraction was 60%.  CONCLUSION:  Coronary artery disease status post remote posterior wall infarction treated with PTI of his circumflex artery with 50% narrowing of the left anterior descending artery, 50/70% narrowing in the proximal to mid circumflex artery with 80% stenosis in the marginal branch and 50% stenosis in the mid right coronary artery with normal left ventricular function.  RECOMMENDATIONS:  It is possible that the ischemia may be due to the lesion in the marginal branch, although, this is not favorable for intervention.  The other lesions do not appear to be flow limiting.  We will plan to evaluate him with an outpatient Cardiolite scan to evaluate the other lesions and there is not ischemia that suggests flow limiting lesions in those areas, will plan medical treatment.  ADDENDUM:  The left ventricular pressure is 165/14 and the aortic pressure is 165/92. DD:  05/22/00 TD:  05/23/00 Job: [id number]

## 2010-07-26 NOTE — Cardiovascular Report (Signed)
NAME:  Darryl Carroll, Darryl Carroll                        ACCOUNT NO.:  [id number]   MEDICAL RECORD NO.:  [id number]                   PATIENT TYPE:  OIB   LOCATION:  2854                                 FACILITY:  [facility]   PHYSICIAN:  Charlies Constable, M.D. [facility]              DATE OF BIRTH:  1927-05-18   DATE OF PROCEDURE:  02/25/2002  DATE OF DISCHARGE:  02/25/2002                              CARDIAC CATHETERIZATION   PROCEDURE PERFORMED:  Cardiac catheterization.   CLINICAL HISTORY:  The patient is 77 years old and had a remote posterior  infarction treated with primary angioplasty. He subsequently had PTCA for re-  stenosis in [place].  He then moved back to [facility] and was last studies  in February of 2002 in which he had moderate disease which was managed  medically.  He recently was scheduled for surgery for a trigger finger and  had a screening Cardiolite scan which showed lateral ischemia.  For this  reason he was brought in for evaluation with angiography.   DESCRIPTION OF PROCEDURE:  The procedure was performed via the right femoral  artery using an arterial sheath and 6 French preformed coronary catheters.  A front wall arterial puncture was performed and Omnipaque contrast was  used. A distal aortogram was performed to rule out abdominal aortic  aneurysm.  The right femoral artery was closed with Perclose.  The patient  tolerated the procedure well and left the laboratory in satisfactory  condition.   RESULTS:  The left main coronary artery:  The left main coronary artery was  free of significant disease.   Left anterior descending:  The left anterior descending artery gave rise to  two septal perforators and a diagonal branch. There was 70% narrowing just  before the diagonal branch.  There was 40% narrowing just after the diagonal  branch in the second septal perforator.  The diagonal branch was completely  occluded and filled faintly by collaterals.  This was new from the  prior  study.   Circumflex artery:  The circumflex artery gave rise to a marginal branch and  two posterolateral branches.  There was moderate calcification.  There was  70% narrowing in the proximal vessel and the mid vessel which extended over  the first marginal branch.  There was also 80% ostial narrowing of the first  marginal branch which had not changed much.   Right coronary artery:  The right coronary is a dominant vessel that gave  rise to a posterior descending branch and three posterolateral branches.  There was 40% segmental proximal stenosis.  There was 70% narrowing in the  proximal to mid vessel.  There was 40% narrowing in the distal vessel.  The  rest of the vessel was free of significant obstruction.   LEFT VENTRICULOGRAPHY:  The left ventriculogram performed in the RAO  projection showed good wall motion with no areas of hypokinesis.  The  estimated ejection  fraction was 60%.   DISTAL AORTOGRAM:  A distal aortogram was performed which showed patent  renal arteries and no significant aortoiliac obstruction.   The aortic pressure was 140/83 with a mean of 114.  The left ventricular  pressure was 140/23.   CONCLUSIONS:  Coronary artery disease, status post remote posterior  infarction treated with primary angioplasty with 70% narrowing in the  proximal and 40% narrowing in the mid left anterior descending and total  occlusion of a diagonal branch (new), 70% narrowing in the proximal and 70%  narrowing in the mid circumflex artery with 80% narrowing in the ostium of  the first marginal branch, 40% narrowing in the proximal right coronary  artery with 70% narrowing in the proximal to mid right coronary artery and  normal left ventricular function.   RECOMMENDATIONS:  The patient's abnormal Cardiolite scan appears to be  related to his occlusion of the diagonal branch of the LAD.  The other  disease is probably nonobstructive. I think medical therapy with any   intensification of secondary risk factor modification that can be done is  indicated.  I think we can approve the patient  for surgery.  His risk will be slightly increased due to his coronary heart  disease.                                                  Charlies Constable, M.D. [facility]    BB/MEDQ  D:  02/25/2002  T:  02/26/2002  Job:  [id number]   cc:   [name], M.D.  [address]  [place], [place] [address]  Fax: [phone]   Olga Millers, M.D. [facility]   Cardiopulmonary Laboratory

## 2010-07-26 NOTE — Telephone Encounter (Signed)
Pt needs a refill on his Indapamide 2.5mg  BID to [facility].  Former pt of Dr Delia Chimes, referred to Dr Sanjuana Kava for future follow-up.

## 2010-07-26 NOTE — Telephone Encounter (Signed)
LMOM for pt, rx sent into pharmacy

## 2010-07-26 NOTE — Discharge Summary (Signed)
NAME:  Darryl Carroll, Darryl Carroll                        ACCOUNT NO.:  [id number]   MEDICAL RECORD NO.:  [id number]                   PATIENT TYPE:  INP   LOCATION:  6524                                 FACILITY:  [facility]   PHYSICIAN:  Theodore Demark, P.A. [facility]            DATE OF BIRTH:  [birth year]   DATE OF ADMISSION:  06/09/2003  DATE OF DISCHARGE:  06/10/2003                                 DISCHARGE SUMMARY   PROCEDURES:  1. Cardiac catheterization.  2. Coronary arteriogram.  3. Percutaneous transluminal coronary angioplasty and stent x2 to one     vessel.  4. Percutaneous transluminal coronary angioplasty to a second vessel.   HOSPITAL COURSE:  [name] is a 77 year old male with known coronary  artery disease.  He was in the hospital from March 24 to June 02, 2003, for  catheterization with PCI.  However, the PCI was aborted because he became  hypotensive.  There was concern that he was having an I/V dye reaction.  It  was felt that he should be discharged, to come back as an outpatient after  treatment for a dye allergy for percutaneous intervention.   [name] came in the hospital on June 09, 2003, for this.  He had a  cardiac catheterization and circumflex was angiographied.  Two stents were  placed to the circumflex and PTCA was performed to the OM.  He tolerated the  procedure well although it was a difficult intervention.  Good results were  achieved.   [name] was on Coumadin prior to his admission for PAF.  He is  currently in sinus rhythm.  Dr. Juanda Chance felt that aspirin and Plavix  treatment was appropriate after multiple interventions.  He then felt that  [name] could be restarted on his Coumadin. Additionally, [name]  states that he has had problems with peptic ulcer disease in the past, and  he was told it was caused by aspirin, and requests a proton pump inhibitors.  This will be added to his medication regimen.   [name] had some problems,  using post procedure.  Pressure was held,  and his dressing was changed.  The next day, the dressing had no hemorrhage  on it, and there was no significant ecchymosis around his groin and no  hematoma.  His post-procedure enzymes were negative.  His hemoglobin did not  significant drop.   [name] has been on atenolol in a dosage as high as 50 mg daily.  This  had been previously cut back to 25 mg daily, and then 12.5 mg daily for  bradycardia.  However, overnight, his heart rate dropped into the 30's with  sinus bradycardia as low as 34.  Dr. Myrtis Ser is to evaluate him and decide if  beta-blocker can be safely continued or if it needs to be discontinued.  He  was placed on it originally for PAF.  Additionally, his potassium is 3.4 and  this will be supplemented.   If [name] ambulates without chest pain or shortness of breath, he will  be considered stable for discharge on June 10, 2003.   LABORATORY DATA:  Hemoglobin 11.7, hematocrit 33.7, WBC 9.4, platelets  196,000.  Post-procedure CK-MB 74/ 4.2.   DISCHARGE CONDITION:  Improved.   DISCHARGE DIAGNOSES:  1. Unstable angina pain, status post PPCI and stent x2 to the circumflex     with PTCA to the OM on this admission.  2. Status post Taxus stent to the LAD and PTCA of the D1, PTCA of he RCA on     May 29, 2003.  3. Preserved left ventricular function with an ejection fraction of 60% at     catheterization in March of 2005.  4. Possible dye allergy, reaction was hypertension.  No problems this     admission with pre-treatment.  5. History of ventricular fibrillation arrest secondary to myocardial     infarction.  6. Benign prostatic hypertrophy.  7. Obstructive sleep apnea.  8. Degenerative joint disease.  9. Dyslipidemia.  10.      Obesity.  11.      Paroxysmal atrial fibrillation.  12.      Intact coagulation with Coumadin on hold secondary to percutaneous     intervention.  13.      Hypertension.  14.       Remote history of peptic ulcer disease.   DISCHARGE INSTRUCTIONS:  1. His activity level is to include no strenuous exertion for two days.  2. He is to stick to a diet that is low in salt, fat and cholesterol.  3. He is to call the office with problems with the catheterization site.  4. He is to call the office to arrange an appointment with Dr. Juanda Chance or his     PA within two weeks.  5. He is to follow up with Dr. Ann Maki T. Stoneking as needed or as scheduled.   DISCHARGE MEDICATIONS:  1. Plavix 75 mg daily.  2. Aspirin 325 mg daily.  3. Tricor 145 mg daily.  4. Crestor 10 mg daily.  5. Trazodone 150 mg q.h.s.  6. Indapamide 2.5 mg daily.  7. Allopurinol 300 mg daily.  8. Atenolol 12.5 mg daily.  9. Lisinopril 20 mg daily.  10.      Nitroglycerin p.r.n.  11.      Coumadin is on hold.  12.      Nexium 40 mg one tablet p.o. b.i.d. for now, then daily.                                                Theodore Demark, P.A. [facility]    RB/MEDQ  D:  06/10/2003  T:  06/11/2003  Job:  [id number]   cc:   [name], M.D.  [address]  [place], [place] [address]  Fax: [phone]   Charlies Constable, M.D. [facility]

## 2010-07-26 NOTE — Op Note (Signed)
NAME:  Darryl Carroll, Darryl Carroll                        ACCOUNT NO.:  [id number]   MEDICAL RECORD NO.:  [id number]                   PATIENT TYPE:  AMB   LOCATION:  DSC                                  FACILITY:  [facility]   PHYSICIAN:  Artist Pais. Mina Marble, M.D.           DATE OF BIRTH:  09/12/1927   DATE OF PROCEDURE:  03/25/2002  DATE OF DISCHARGE:                                 OPERATIVE REPORT   PREOPERATIVE DIAGNOSIS:  Left cubital tunnel syndrome and left carpal tunnel  syndrome.   POSTOPERATIVE DIAGNOSIS:  Left cubital tunnel syndrome and left carpal  tunnel syndrome.   PROCEDURE:  1. Left cubital tunnel release.  2. Left carpal tunnel release.   SURGEON:  Artist Pais. Mina Marble, M.D.   ASSISTANT:  Junius Roads. Bobbe Medico.   ANESTHESIA:  General anesthesia.   TOURNIQUET TIME:  The tourniquet time was 35 minutes.   COMPLICATIONS:  None.   DRAINS:  None.   DESCRIPTION OF PROCEDURE:  The patient was taken to the operating room.  After the induction of general anesthesia, the left upper extremity was  prepped and draped in the usual sterile fashion.  Esmarch was used to  exsanguinate the limb.  Tourniquet was inflated to 250 mmHg.  At this point  in time, a 2 cm incision was made in the palmar aspect of the left hand in  line with the long finger metacarpal starting at Kaplan's cardinal line.  The incision was taken down through the skin and subcutaneous tissues until  the palmar fascia was identified  The palmar fascia was split longitudinally  thus exposing the superficial palmar arch and the distal edge of the  transverse carpal ligament.  The distal edge of the transverse carpal  ligament was incised with the 15 blade, thus exposing the median nerve and  contents of the carpal canal.  Next, using a freer elevator, a path was  created dorsal and volar to the remaining aspects of the transverse carpal  ligament which was then divided under direct vision using a curved blunt  scissor.  The canal was inspected.  There were no obvious lesions or  ganglions present.  Hemostasis was achieved with bipolar cautery and the  wound was then loosely closed with a running 3-0 Prolene subcuticular  stitch.  The second incision was then made on the medial aspect of the left  elbow, centered between the olecranon process and the medial epicondyle  extending from 2 cm in either direction.  The incision was taken down  through the skin and subcutaneous tissues with care to carefully identify  and retract branches of the medial and brachiocutaneous nerve.  Once this  was done, the ulnar nerve was identified proximal.  The cubital tunnel  released through the lease of the cubital tunnel distally to the flexor  carpi ulnaris muscle bellies where the fascia was released and proximally  under a skin bridge.  After this  was done, the nerve was unstable in the  groove to some slight degree.  Therefore, a small fascial splint was made to  stabilize the nerve.  It was then reconstructed using a 4-0 Vicryl suture  x1.  After this was done, the nerve was stable with flexion and extension.  The wound was then thoroughly irrigated and  loosely closed with running 3-0 Prolene subcuticular stitch.  Steri-Strips,  4 x 4s, fluffs and compressive dressings were applied to both the volar  wound on the wrist and also on the elbow.  The patient tolerated the  procedure well and went to recovery in a stable fashion.                                               Artist Pais Mina Marble, M.D.    MAW/MEDQ  D:  03/25/2002  T:  03/25/2002  Job:  [id number]

## 2010-07-26 NOTE — Op Note (Signed)
NAME:  MONTERRIO, GERST                        ACCOUNT NO.:  [id number]   MEDICAL RECORD NO.:  [id number]                   PATIENT TYPE:  AMB   LOCATION:  DSC                                  FACILITY:  [facility]   PHYSICIAN:  Artist Pais. Mina Marble, M.D.           DATE OF BIRTH:  May 22, 1927   DATE OF PROCEDURE:  03/02/2002  DATE OF DISCHARGE:                                 OPERATIVE REPORT   PREOPERATIVE DIAGNOSES:  1. Right long finger stenosing tenosynovitis.  2. Right carpal tunnel syndrome.  3. Right cubital tunnel syndrome.   POSTOPERATIVE DIAGNOSES:  1. Right long finger stenosing tenosynovitis.  2. Right carpal tunnel syndrome.  3. Right cubital tunnel syndrome.   PROCEDURES:  1. Release of right long finger A1 pulley.  2. Right carpal tunnel release.  3. Right cubital tunnel release.   SURGEON:  Artist Pais. Mina Marble, M.D.   ASSISTANT:  __________.   ANESTHESIA:  General.   TOURNIQUET TIME:  38 minutes.   COMPLICATIONS:  None.   DRAINS PLACED:  None.   DESCRIPTION OF PROCEDURE:  The patient was taken to the operating room.  After adequate induction of general anesthesia, the right upper extremity  was prepped and draped in the usual sterile fashion.  An Esmarch was used to  exsanguinate the limb.  The tourniquet was then inflated to 250 mmHg.  At  this point in time, a transverse incision was made over the A1 pulley of the  long finger in the palm of the right hand of 0.5 cm in length.  The incision  was taken down through the skin and subcutaneous tissues.  The neurovascular  bundles were identified and retracted.  The A1 pulley was longitudinally  split, lifting up the FDS and FTB tendons.   A second incision was made in the palmar aspect of the right hand in line of  the long finger metacarpal, starting at the captain's cardinal line 2 cm in  length.  The incision was taken down through the skin and subcutaneous  tissues until the palmar fascia was  identified.  The palmar fascia was  split, thus exposing the superficial palmar arch and the distal edge of the  transverse carpal ligament.  The superficial palmar arch was retracted  distally.  A 15 blade was then used to incise the transverse carpal  ligament, the median nerve was exposed, protected, and the remaining aspect  of the transverse carpal ligament were divided under direct vision.  The  wound was then thoroughly irrigated.   A third incision was made on the medial aspect of the right elbow, centered  between the medial epicondyle and olecranon process 5 cm in length.  The  incision was taken down through the skin and subcutaneous tissues.  Care was  taken to identify and retract branches of the medial antebrachial cutaneous  nerve.  Once this was done, the ulnar nerve was identified proximal to  the  cubital tunnel.  The cubital tunnel itself was released, and the ulnar nerve  was released into the flesh overlying the flexor carpi ulnaris muscle  bellies.  Proximally, it was released 5-6 cm above the medial epicondyle  under a skin bridge.  The nerve was completely compressed.  The medial  muscular septum was palpated; it was soft, and there were no other  constricting bands evident.  The wound was then thoroughly irrigated.   All three wounds were then loosely closed with one inch 3-0 Prolene  subcutaneous stitches, Steri-Strips, 4 x 4s, and fluffs and compressive  dressing at the elbow and hand were applied.   The patient tolerated the procedure well, went to recovery room in stable  fashion.                                               Artist Pais Mina Marble, M.D.    MAW/MEDQ  D:  03/02/2002  T:  03/02/2002  Job:  [id number]

## 2010-08-06 ENCOUNTER — Encounter: Payer: Self-pay | Admitting: Cardiovascular Disease

## 2010-08-13 ENCOUNTER — Other Ambulatory Visit: Payer: Self-pay | Admitting: Geriatric Medicine

## 2010-08-13 ENCOUNTER — Ambulatory Visit: Payer: Medicare Other | Admitting: Cardiovascular Disease

## 2010-08-13 ENCOUNTER — Ambulatory Visit
Admission: RE | Admit: 2010-08-13 | Discharge: 2010-08-13 | Disposition: A | Discharge status: Home / Self Care | Payer: Medicare Other | Source: Ambulatory Visit | Attending: Geriatric Medicine | Admitting: Geriatric Medicine

## 2010-08-13 DIAGNOSIS — R269 Unspecified abnormalities of gait and mobility: Secondary | ICD-10-CM

## 2010-08-15 ENCOUNTER — Telehealth: Payer: Self-pay

## 2010-08-15 ENCOUNTER — Inpatient Hospital Stay (HOSPITAL_COMMUNITY)
Admission: EM | Admit: 2010-08-15 | Discharge: 2010-08-19 | DRG: 179 | Disposition: A | Discharge status: Home Health | Payer: Medicare Other | Attending: Internal Medicine | Admitting: Internal Medicine

## 2010-08-15 ENCOUNTER — Emergency Department (HOSPITAL_COMMUNITY): Payer: Medicare Other

## 2010-08-15 DIAGNOSIS — Z9861 Coronary angioplasty status: Secondary | ICD-10-CM

## 2010-08-15 DIAGNOSIS — E119 Type 2 diabetes mellitus without complications: Secondary | ICD-10-CM | POA: Diagnosis present

## 2010-08-15 DIAGNOSIS — I4891 Unspecified atrial fibrillation: Secondary | ICD-10-CM | POA: Diagnosis present

## 2010-08-15 DIAGNOSIS — R269 Unspecified abnormalities of gait and mobility: Secondary | ICD-10-CM | POA: Diagnosis present

## 2010-08-15 DIAGNOSIS — R1310 Dysphagia, unspecified: Secondary | ICD-10-CM | POA: Diagnosis present

## 2010-08-15 DIAGNOSIS — R9409 Abnormal results of other function studies of central nervous system: Secondary | ICD-10-CM | POA: Diagnosis present

## 2010-08-15 DIAGNOSIS — M810 Age-related osteoporosis without current pathological fracture: Secondary | ICD-10-CM | POA: Diagnosis present

## 2010-08-15 DIAGNOSIS — J690 Pneumonitis due to inhalation of food and vomit: Principal | ICD-10-CM | POA: Diagnosis present

## 2010-08-15 DIAGNOSIS — I2510 Atherosclerotic heart disease of native coronary artery without angina pectoris: Secondary | ICD-10-CM | POA: Diagnosis present

## 2010-08-15 DIAGNOSIS — E876 Hypokalemia: Secondary | ICD-10-CM | POA: Diagnosis present

## 2010-08-15 DIAGNOSIS — E785 Hyperlipidemia, unspecified: Secondary | ICD-10-CM | POA: Diagnosis present

## 2010-08-15 DIAGNOSIS — Z95810 Presence of automatic (implantable) cardiac defibrillator: Secondary | ICD-10-CM

## 2010-08-15 DIAGNOSIS — Y9210 Unspecified residential institution as the place of occurrence of the external cause: Secondary | ICD-10-CM | POA: Diagnosis present

## 2010-08-15 DIAGNOSIS — I10 Essential (primary) hypertension: Secondary | ICD-10-CM | POA: Diagnosis present

## 2010-08-15 DIAGNOSIS — F4310 Post-traumatic stress disorder, unspecified: Secondary | ICD-10-CM | POA: Diagnosis present

## 2010-08-15 DIAGNOSIS — G4733 Obstructive sleep apnea (adult) (pediatric): Secondary | ICD-10-CM | POA: Diagnosis present

## 2010-08-15 DIAGNOSIS — W010XXA Fall on same level from slipping, tripping and stumbling without subsequent striking against object, initial encounter: Secondary | ICD-10-CM | POA: Diagnosis present

## 2010-08-15 DIAGNOSIS — M109 Gout, unspecified: Secondary | ICD-10-CM | POA: Diagnosis present

## 2010-08-15 LAB — DIFFERENTIAL
Basophils Absolute: 0 10*3/uL (ref 0.0–0.1)
Basophils Relative: 0 % (ref 0–1)
Eosinophils Absolute: 0.1 10*3/uL (ref 0.0–0.7)
Lymphs Abs: 0.9 10*3/uL (ref 0.7–4.0)
Neutro Abs: 12.8 10*3/uL — ABNORMAL HIGH (ref 1.7–7.7)

## 2010-08-15 LAB — URINALYSIS, ROUTINE W REFLEX MICROSCOPIC
Glucose, UA: NEGATIVE mg/dL
Ketones, ur: NEGATIVE mg/dL
Protein, ur: 100 mg/dL — AB
Urobilinogen, UA: 0.2 mg/dL (ref 0.0–1.0)

## 2010-08-15 LAB — CBC
MCH: 27.1 pg (ref 26.0–34.0)
MCHC: 33.6 g/dL (ref 30.0–36.0)
MCV: 80.6 fL (ref 78.0–100.0)
Platelets: 200 10*3/uL (ref 150–400)

## 2010-08-15 LAB — BASIC METABOLIC PANEL
BUN: 18 mg/dL (ref 6–23)
CO2: 25 mEq/L (ref 19–32)
Calcium: 9.3 mg/dL (ref 8.4–10.5)
Creatinine, Ser: 0.87 mg/dL (ref 0.4–1.5)
GFR calc Af Amer: >60 mL/min (ref >60)

## 2010-08-15 LAB — URINE MICROSCOPIC-ADD ON

## 2010-08-15 LAB — GLUCOSE, CAPILLARY: Glucose-Capillary: 282 mg/dL — ABNORMAL HIGH (ref 70–99)

## 2010-08-15 NOTE — Telephone Encounter (Signed)
Pt's wife called states pt has Coumadin appt tomorrow, but pt fell and is having difficulty walking and Dr [name] has placed pt in [place], for rehab.  Advised pt's wife Coumadin is monitored at facility by Dr. [name] while pt is in rehab.  Advised pt's wife I will cancel appt for tomorrow , and to call us once discharged to make follow-up appt post rehab.  Wife agrees.

## 2010-08-16 ENCOUNTER — Inpatient Hospital Stay (HOSPITAL_COMMUNITY): Payer: Medicare Other

## 2010-08-16 ENCOUNTER — Encounter: Payer: Medicare Other | Admitting: *Deleted

## 2010-08-16 LAB — MRSA PCR SCREENING: MRSA by PCR: NEGATIVE

## 2010-08-16 LAB — CBC
Hemoglobin: 11.9 g/dL — ABNORMAL LOW (ref 13.0–17.0)
MCH: 27.2 pg (ref 26.0–34.0)
MCHC: 33.4 g/dL (ref 30.0–36.0)
Platelets: 159 10*3/uL (ref 150–400)
RDW: 15.1 % (ref 11.5–15.5)

## 2010-08-16 LAB — BLOOD GAS, ARTERIAL
Drawn by: 336861
Patient temperature: 98.6
TCO2: 23.8 mmol/L (ref 0–100)
pCO2 arterial: 42.3 mmHg (ref 35.0–45.0)
pH, Arterial: 7.412 (ref 7.350–7.450)

## 2010-08-16 LAB — BASIC METABOLIC PANEL
CO2: 28 mEq/L (ref 19–32)
Calcium: 9.2 mg/dL (ref 8.4–10.5)
Creatinine, Ser: 0.91 mg/dL (ref 0.4–1.5)
GFR calc Af Amer: >60 mL/min (ref >60)
GFR calc non Af Amer: >60 mL/min (ref >60)
Sodium: 134 mEq/L — ABNORMAL LOW (ref 135–145)

## 2010-08-16 LAB — GLUCOSE, CAPILLARY
Glucose-Capillary: 138 mg/dL — ABNORMAL HIGH (ref 70–99)
Glucose-Capillary: 179 mg/dL — ABNORMAL HIGH (ref 70–99)
Glucose-Capillary: 124 mg/dL — ABNORMAL HIGH (ref 70–99)

## 2010-08-16 LAB — PROTIME-INR: INR: 1.84 — ABNORMAL HIGH (ref 0.00–1.49)

## 2010-08-16 LAB — HEMOGLOBIN A1C: Mean Plasma Glucose: 140 mg/dL — ABNORMAL HIGH (ref <117)

## 2010-08-17 LAB — CBC
HCT: 35.9 % — ABNORMAL LOW (ref 39.0–52.0)
MCHC: 33.4 g/dL (ref 30.0–36.0)
MCV: 81.6 fL (ref 78.0–100.0)
Platelets: 195 10*3/uL (ref 150–400)
RDW: 15.2 % (ref 11.5–15.5)

## 2010-08-17 LAB — GLUCOSE, CAPILLARY
Glucose-Capillary: 151 mg/dL — ABNORMAL HIGH (ref 70–99)
Glucose-Capillary: 157 mg/dL — ABNORMAL HIGH (ref 70–99)

## 2010-08-17 LAB — BASIC METABOLIC PANEL
CO2: 28 mEq/L (ref 19–32)
Calcium: 9.4 mg/dL (ref 8.4–10.5)
Creatinine, Ser: 0.7 mg/dL (ref 0.4–1.5)
GFR calc non Af Amer: >60 mL/min (ref >60)
Glucose, Bld: 145 mg/dL — ABNORMAL HIGH (ref 70–99)

## 2010-08-17 LAB — PROTIME-INR: INR: 2.18 — ABNORMAL HIGH (ref 0.00–1.49)

## 2010-08-18 LAB — GLUCOSE, CAPILLARY
Glucose-Capillary: 156 mg/dL — ABNORMAL HIGH (ref 70–99)
Glucose-Capillary: 154 mg/dL — ABNORMAL HIGH (ref 70–99)
Glucose-Capillary: 140 mg/dL — ABNORMAL HIGH (ref 70–99)

## 2010-08-18 LAB — BASIC METABOLIC PANEL
Calcium: 9.4 mg/dL (ref 8.4–10.5)
GFR calc Af Amer: >60 mL/min (ref >60)
GFR calc non Af Amer: >60 mL/min (ref >60)
Glucose, Bld: 156 mg/dL — ABNORMAL HIGH (ref 70–99)
Potassium: 3.3 mEq/L — ABNORMAL LOW (ref 3.5–5.1)
Sodium: 140 mEq/L (ref 135–145)

## 2010-08-18 LAB — PROTIME-INR
INR: 2.18 — ABNORMAL HIGH (ref 0.00–1.49)
Prothrombin Time: 24.4 seconds — ABNORMAL HIGH (ref 11.6–15.2)

## 2010-08-18 LAB — CBC
Hemoglobin: 11.4 g/dL — ABNORMAL LOW (ref 13.0–17.0)
MCHC: 32.1 g/dL (ref 30.0–36.0)
Platelets: 185 10*3/uL (ref 150–400)
RBC: 4.3 MIL/uL (ref 4.22–5.81)

## 2010-08-18 LAB — MAGNESIUM: Magnesium: 2 mg/dL (ref 1.5–2.5)

## 2010-08-19 LAB — BASIC METABOLIC PANEL
Chloride: 102 mEq/L (ref 96–112)
GFR calc Af Amer: >60 mL/min (ref >60)
GFR calc non Af Amer: >60 mL/min (ref >60)
Potassium: 4 mEq/L (ref 3.5–5.1)
Sodium: 139 mEq/L (ref 135–145)

## 2010-08-19 LAB — GLUCOSE, CAPILLARY
Glucose-Capillary: 159 mg/dL — ABNORMAL HIGH (ref 70–99)
Glucose-Capillary: 110 mg/dL — ABNORMAL HIGH (ref 70–99)

## 2010-08-19 LAB — PROTIME-INR
INR: 2.33 — ABNORMAL HIGH (ref 0.00–1.49)
Prothrombin Time: 25.7 seconds — ABNORMAL HIGH (ref 11.6–15.2)

## 2010-08-19 LAB — CULTURE, RESPIRATORY W GRAM STAIN

## 2010-08-20 NOTE — Discharge Summary (Addendum)
NAMEVIOLA, Darryl Carroll NO.:  [id number]  MEDICAL RECORD NO.:  [id number]  LOCATION:  1409                         FACILITY:  [facility]  PHYSICIAN:  Hillery Aldo, M.D.   DATE OF BIRTH:  07/28/1927  DATE OF ADMISSION:  08/15/2010 DATE OF DISCHARGE:  08/19/2010                        DISCHARGE SUMMARY - REFERRING   PRIMARY CARE PHYSICIAN:  [name], M.D.  DISCHARGE DIAGNOSES: 1. Aspiration pneumonia. 2. Hypokalemia. 3. Diabetes type 2. 4. Gait disturbance/status post fall. 5. Dysphagia. 6. Paroxysmal atrial fibrillation. 7. Abnormal MRI of the head. 8. History of posttraumatic stress disorder. 9. Hypertension/coronary artery disease/hyperlipidemia. 10.Gout. 11.Sleep apnea.  DISCHARGE MEDICATIONS: 1. Celexa 20 mg p.o. daily. 2. Levaquin 500 mg p.o. daily, take for 6 more days, last dose August 24, 2010. 3. Potassium chloride 20 mEq p.o. q.i.d. 4. Thick-It food thickener 5 g p.o. as needed. 5. Albuterol nebulization 5 mg/mL 2 puffs every 6 hours as needed for     shortness of breath. 6. Allopurinol 300 mg p.o. daily. 7. Atenolol 100 mg p.o. daily. 8. Calcium 600 mg p.o. b.i.d. 9. Coumadin 2.5 mg p.o., take 2.5 mg tab for 2 days, then 3 mg tab for     1 day. 10.Coumadin 3 mg take one 3 mg tablet every 2 days p.o. 11.Crestor 20 mg half a tab p.o. daily. 12.Finasteride 5 mg p.o. daily. 13.Lasix 20 mg p.o. daily. 14.Gemfibrozil  600 mg p.o. b.i.d. 15.Glipizide 10 mg p.o. b.i.d. 16.Indapamide 2.5 mg p.o. daily. 17.Lisinopril 40 mg p.o. b.i.d. 18.Metformin 500 mg p.o. b.i.d. 19.Multivitamin 1 tablet p.o. daily. 20.Nitroglycerin sublingually every 5 minutes as needed up to three     doses for chest pain. 21.Norvasc 5 mg p.o. daily. 22.Prosight multivitamin 1 tablet p.o. b.i.d. 23.Robitussin 15 mL p.o. every 4 hours as needed for cough. 24.Trazodone 150 mg p.o. daily at bedtime.  DIAGNOSTIC LABS:  WBC 14.5, hemoglobin 13.0, hematocrit 38.7,  platelets 200, neutrophils 88%, absolute neutrophils 12.8.  Sodium 130, potassium 3.4, chloride 92, CO2 of 25, BUN 18, creatinine 0.87, glucose 213, PT 19.6, INR 1.64.  ABG with a pH of 7.41, pCO2 42.3, pO2 82.6, bicarb 26.4, total CO2 of 23.8.  MRSA PCR screening was negative.  Hemoglobin A1c is 6.5.  Urinalysis was negative.  Blood culture shows no growth to date x2.  Magnesium level 2.0.  Respiratory culture shows gram-positive cocci in pairs.  DIAGNOSTIC IMAGING:  Chest x-ray on August 15, 2010, shows right lower lobe infiltrate.  An MR of the brain without contrast yields no acute infarction.  No intracranial hemorrhage.  Global atrophy.  Ventricular prominence may be related to atrophy although difficult to completely exclude mild component of hydrocephalus.  Mild to moderate white matter type changes.  CONSULTATIONS:  Dr. Thana Farr with Neurology on August 16, 2010.  PROCEDURES:  Modified barium swallow on August 16, 2010, yielded positive silent aspiration with thin liquids as well as positive silent penetration with nectar unless a chin tuck is used.  They recommended changing diet to dysphagia 3 with nectar-thick liquids with consistent chin tuck after each sip.  Also recommended full supervision with liquids.  BRIEF HISTORY:  Darryl Carroll is  a very pleasant 77 year old male with a history of AFib, diabetes, CAD, gout who presented to the [facility] ED with a chief complaint of cough, tremors and worsening mental status. In the emergency room, he had a temperature of 104.6 and a positive chest x-ray for right lower lobe infiltrate.  He indicates that Dr. [name] had been treating him for chest congestion with azithromycin but he became more confused and continued to have difficulty with falling.  He also reported that on August 13, 2010, he was seen and evaluated with an MRI secondary to multiple falling episodes which showed no obvious intracranial process. Hospitalists  were asked to admit for further evaluation and treatment.  HOSPITAL COURSE: 1. Aspiration pneumonia.  The patient received IV antibiotics,     Levaquin/Zosyn for 4 days.  He remained afebrile with a normal     white count.  He will go home for an additional 6 days of p.o.     Levaquin. 2. Hypokalemia.  The patient was started on K-Dur, will continue with     20 mEq p.o. q.i.d. on discharge.  On date of discharge, his     potassium is 4.0.  Will need to follow up with Dr. [name] in 7     to 10 days and get lab work to evaluate his potassium level. 3. Diabetes type 2, was stable and controlled on his home medications     of metformin.  His hemoglobin A1c 6.5. 4. Status post fall/gait disturbance.  The patient did demonstrate     some shuffling and instability with ambulation.  Neurology was     called for consult given his gait disturbances as well as     questionable hydrocephalus on MRI.  He was seen on August 16, 2010.     Dr. Thad Ranger at that time recommended discontinuation of his Geodon     as it could be contributing to the tremors and unsteady gait.  His     Geodon was discontinued.  However, his gait did not improve, with     questionable diagnosis of Parkinson's.  Recommend follow up with     Dr. Merlene Laughter and possible outpatient trial of Sinemet. 5. Dysphagia.  The patient was evaluated by Speech Therapy, also     underwent modified barium swallow.  Recommendations were as stated     above.  The patient has been educated on aspiration precautions and     necessity to thicken his liquids. 6. PAF.  The patient has been in normal sinus rhythm.  His Coumadin     has been continue.  His INR on the date of discharge is 2.33.  The     patient to follow up with Dr. [name] 7. Abnormal MRI of the brain.  The patient was evaluated by Neurology.     No hydrocephalus clinically per Neuro. 8. History of the PTSD.  Geodon was discontinued.  Remained stable     during  hospitalization. 9. Hypertension/CAD/hyperlipidemia, controlled during this     hospitalization.  The patient continued on his home medications. 10.History of gout.  Continued his allopurinol. 11.Sleep apnea.  The patient was placed on CPAP at night..  PHYSICAL EXAMINATION:  VITAL SIGNS:  Temperature 98.3, blood pressure 138/98, heart rate 60, respiratory rate 18, sats 97% on 2 L. GENERAL:  Awake, alert, up in chair, no acute distress. CV:  Regular rate and rhythm.  No murmur, gallop or rub.  No lower extremity edema. RESPIRATORY:  Normal effort.  Breath sounds clear to auscultation bilaterally. ABDOMEN:  Round, soft, positive bowel sounds throughout. NEURO:  Alert and oriented x3.  Speech clear.  FOLLOWUP:  The patient to follow up with his primary care provider in 7 to 10 days.  He will need lab work to check his potassium level.  Would also consider a Sinemet trial on outpatient basis per Dr. [name]. The patient will also receive home health PT and a 2-wheeled walker. The patient was advised not to use the 4-wheeled walker at this time secondary to his risk for falling.  DISPOSITION:  The patient is being discharged to home, medically stable and ready for same.  It was truly a pleasure taking care of Mr. Googe.  Time spent on this discharge, 50 minutes.     Gwenyth Bender, NP   ______________________________ Hillery Aldo, M.D.    KMB/MEDQ  D:  08/19/2010  T:  08/19/2010  Job:  [id number]  cc:   [name], M.D. Fax: [phone]  Electronically Signed by Hillery Aldo M.D. on 08/20/2010 07:03:42 AM Electronically Signed by Toya Smothers  on 08/28/2010 07:33:12 AM

## 2010-08-22 LAB — CULTURE, BLOOD (ROUTINE X 2)
Culture  Setup Time: 201206080936
Culture: NO GROWTH

## 2010-08-22 NOTE — Consult Note (Signed)
Darryl Carroll, SALCE NO.:  [id number]  MEDICAL RECORD NO.:  [id number]  LOCATION:  1409                         FACILITY:  [facility]  PHYSICIAN:  Thana Farr, MD    DATE OF BIRTH:  [birth year]  DATE OF CONSULTATION:  08/16/2010 DATE OF DISCHARGE:                                CONSULTATION   HISTORY:  Darryl Carroll is an 77 year old male who has complaints of tremor, shuffling gait.  MRI of the brain has been performed in work up and is abnormal.  The patient reports that he began have difficulty walking approximately 10 years ago.  He reports that he will go to a certain distance and then feel weak in his low back and feel weakness in his legs as well.  Was diagnosed with spinal stenosis but no surgery was recommended at that time.  The patient said that he did fairly well until approximately 3 years ago when he was going to his bathroom, dropped some alcohol and slipped and fell on the alcohol.  He report that he took a bad fall and fractured his left shoulder.  Since this fracture, he has had more difficulty with ambulation.  He has not had significant amount of falls until recently. He is unable to explain why he has had more fall recently.  Also reports that he has noticed a tremor in his right upper extremity.  Reports he has intermittent left upper extremity ever since the fall 3 years ago.  PAST MEDICAL HISTORY:  Bilateral carpal tunnel release status post TURP, atrial fibrillation, hypercholesterolemia, hypertension, coronary artery disease status post MI x2, BPH, peptic ulcer disease, DJD.  MEDICATIONS:  Zyloprim, Os-Cal, Lasix, lisinopril, Crestor, Zofran, Norvasc, Celexa, Lopid, metformin, Coumadin, Ventolin, Tenormin, Proscar, Lozol, Zosyn, Geodon.  PHYSICAL EXAMINATION:  VITAL SIGNS:  Blood pressure 127/73, heart rate 58, respiratory rate 18, temperature 98.1. MENTAL STATUS EXAM:  On mental status testing, the patient is alert and oriented.  He  is able to give a full and coherent history.  Speech is fluent.  On cranial nerve testing:  II, visual fields grossly intact. III, IV, VI; extraocular movements intact.  V and VII, smile symmetric. VIII, grossly intact.  IX and X, positive gag.  XI, bilateral shoulder shrug.  XII, midline tongue extension.  On motor exam, the patient has 5/5 strength in the bilateral lower extremities.  There is increased tone with the right being worse than the left.  Deep tendon reflexes are 2+ with absent ankle jerks.  Plantars upgoing on the right and downgoing on the left.  On cerebellar testing, finger-to-nose and heel-to-shin intact.  There is a mild tremor noted on both upper extremities, the right being worse than the left.  LABORATORY DATA:  Laboratory data shows a sodium of 134, potassium 3.2, chloride 96, bicarb 28, BUN 19, creatinine 0.9, glucose 232, white blood cell count 14.6, platelet count 159.  Hemoglobin and hematocrit 11.9 and 35.2 respectively.  Hemoglobin A1c 6.5.  MRI of the brain showed no acute infarct.  There is ventricular prominence that is likely related to atrophy.  ASSESSMENT:  [name] is an 77 year-old male with difficulty with gait and tremor.  There  are no other symptoms that are consistent with the triad that is seen with normal pressure hydrocephalous and I do not believe that is his clinical diagnosis.  There are some features of Parkinson's disease though on exam.  Cannot rule out the possibility that these may have been exacerbated by the use of Geodon.  PLAN: 1. Would consider discontinuation of Geodon. 2. If there is no improvement in gait with discontinuation of     Geodon the patient should have a      trial of Sinemet.  This may be done as an outpatient.          ______________________________ Thana Farr, MD     LR/MEDQ  D:  08/16/2010  T:  08/16/2010  Job:  [id number]  Electronically Signed by Thana Farr MD on 08/22/2010 09:59:40 AM

## 2010-09-02 ENCOUNTER — Encounter: Payer: Self-pay | Admitting: Cardiovascular Disease

## 2010-09-03 ENCOUNTER — Telehealth: Payer: Self-pay | Admitting: Cardiovascular Disease

## 2010-09-03 NOTE — Telephone Encounter (Signed)
Pt had appt on 6/5 that was cancel. Pt wife wants to r / s this.

## 2010-09-03 NOTE — Telephone Encounter (Signed)
APPT MADE  FOR PT ON 10/03/10  AT 1:45 PM

## 2010-09-03 NOTE — H&P (Signed)
Darryl Carroll, [name]              ACCOUNT NO.:  [id number]  MEDICAL RECORD NO.:  [id number]  LOCATION:  1409                         FACILITY:  [facility]  PHYSICIAN:  [name], MD  DATE OF BIRTH:  05-11-1927  DATE OF ADMISSION:  08/15/2010 DATE OF DISCHARGE:                             HISTORY & PHYSICAL   DATE OF EVALUATION:  08/15/2010.  PRIMARY CARE PHYSICIAN:  [name], MD  CHIEF COMPLAINT:  Altered mental status and cough.  HISTORY OF PRESENT ILLNESS:  The patient is an 77 year old white male who on Tuesday fell at [facility].  He was taken down to the [facility] area and was monitored overnight.  He continued to have cough, worsening mental status, and tremors to the point that it appeared that he was shaking.  He was brought to the emergency room and diagnosed with pneumonia.  His temperature was 104.6; he had a positive cough and chest x-ray showed a right lower lobe area.  Dr. [name] had been treating the patient for chest congestion with azithromycin but he became more confused around 5 p.m. and continued to have difficulty with falling.  Incidentally on June 5, the  patient was seen and evaluated by MRI for the falling episodes which showed no obvious intracranial process.  He, therefore, was admitted to the hospital for further care.  REVIEW OF SYSTEMS:  Is limited by the patient's cognitive status.  At this time, he is sleeping although he is arousable.  He has a positive cough.  He has been falling.  He has a tremor.  But he denies any nausea or vomiting; he denies diarrhea.  Eyes:  He denies blurred vision, double vision.  He does have decreased hearing.  Endocrine:  No heat or cold intolerance.  PAST MEDICAL HISTORY:  Sleep apnea, A-fib, diabetes, coronary artery disease, gout, hyperlipidemia, glaucoma, hypertension, osteoporosis, myocardial infarction, peptic ulcer disease, posttraumatic stress disorder from his time serving in  the [employer] in [place], BPH, spinal stenosis, and at some point in the past he has required ACLS defibrillator intervention for cardiac death.  The patient did have a stent placed in and it was surrounding one of the times during his stents that he went into cardiac arrest.  PAST SURGICAL HISTORY:  Shoulder replacement and hemorrhoids.  SOCIAL HISTORY:  The patient quit tobacco in 87, he does not drink alcohol.  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  He was in the [employer] in [place].  He also worked for [employer] and [employer]. At this time, the patient's spouse shares with me that he desires to be a full code despite his multiple medical illnesses.  FAMILY HISTORY:  Mom is deceased of unknown medical illness.  Dad deceased with a history of stroke at the age of 45, and he had a brother who had a myocardial infarction at the age of 58.  MEDICATIONS: 1. Albuterol nebulizer 2 puffs q.6 h p.r.n. 2. Allopurinol 300 mg 1 tablet daily. 3. Atenolol 100 mg p.o. daily. 4. Azithromycin 250 mg daily for 4 days and he was given 1 single dose     of 500 mg. 5. Calcium  600 mg b.i.d. 6. Citalopram 40 mg b.i.d. 7. Coumadin 2.5 mg alternating with 3 mg. 8. Crestor 20 mg half a tablet daily. 9. Finasteride 5 mg p.o. daily. 10.Furosemide 20 mg daily. 11.Gemfibrozil 600 mg b.i.d. 12.Geodon 80 mg 1 capsule daily. 13.Glipizide 10 mg b.i.d. 14.Indapamide 2.5 mg daily. 15.Lisinopril 40 mg b.i.d. 16.Metformin 500 mg b.i.d. 17.Multivitamin 1 tablet daily. 18.Nitroglycerin p.r.n. 19.Norvasc 5 mg daily. 20.ProSight 1 tablet b.i.d. 21.Robitussin 50 mL q.4 p.r.n. for cough. 22.Trazodone 150 mg at bedtime.  LABORATORY DATA:  His PT was 19.6 with an INR of 1.64.  Sodium 130, potassium 3.4, chloride 92, CO2 25, BUN 18, creatinine 0.87 and glucose of 213.  His white count is 14.5, hemoglobin 13, hematocrit 38.7, platelets of 200.  He has a right lower lobe infiltrate on chest x-ray. Review  of old records show that on 07/2003, he had stent to the circumflex, and on August 13, 2010, as an outpatient he had an MRI of the brain to assess his gait disturbance with some question of mild hydrocephalus, mild-to-moderate white-matter changes.  PHYSICAL EXAMINATION:  VITAL SIGNS:  Temperature was 97.8, blood pressure 115/64, pulse 63, respirations 20, saturation 96% on 2 L.  His T-max was 104.6. GENERAL:  This is a well-developed, moderately-obese white male in no acute distress.  He is sleeping but is easily arousable.  He states that he is not having any pain and he does not appear to be in any distress. HEENT:  His membranes are moist. NECK:  Supple.  No JVD.  No lymph nodes.  No carotid bruits. CHEST:  Decreased with a coarse pattern of respirations with rhonchi, right greater than left. CARDIOVASCULAR:  Irregular, positive S1, S2, no S3 or S4.  I do not appreciate murmur, rub or gallop. ABDOMEN:  Obese, soft, nontender, nondistended with positive bowel sounds. EXTREMITIES:  Trace edema. NEUROLOGICAL:  He was able to awaken from his sleep and communicate with me but there are times where I am concerned that he is continuing to be confused.  ASSESSMENT AND PLAN:  This is an 77 year old white male with a right lower lobe pneumonia.  He is status post a fall, which was evaluated with MRI, and he was started on a azithromycin for a possible upper respiratory infection prior to admission.  The patient presents with worsening confusion, fever of 104 and pneumonia. 1. Right lower lobe pneumonia:  The patient lives with his wife but     could have aspiration since he uses CPAP.  It is concerning to me     that he has what appears to be masked facies, which may be from his     Geodon but certainly Parkinson's disease could present this way.  I     would like to change his antibiotics to cover aspiration better so     will go to Zosyn and Levaquin.  I will check an ABG to ensure that      his somnolence is not pathological, and we will order a speech     evaluation for aspiration. 2. Obstructive sleep apnea:  Will continue his CPAP on auto mode since     we do not know his setting and, as I stated, I will check an     arterial blood gases. 3. Diabetes:  For now, I am going to hold his glipizide and use     sliding scale insulin.  I will go ahead and continue his metformin     unless he  is going to have a study with IV contrast at which time     it should be discontinued. 4. Gait disturbance could be from pneumonia.  Hydrocephalus was in     question and I am fascinated by the masked facies, wondering     whether or not this is Geodon effects versus parkinsonism.  We will     ask the primary service in the morning to investigate further. 5. 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 6. Gout:  Will continue his indapamide and allopurinol. 7. Hypertension:  We will continue Norvasc ACE inhibitor with Lasix. 8. History of coronary artery disease with myocardial infarction with     stents and cardiac arrest in the past with defibrillation.  Will     continue his atenolol and telemonitor him. 9. For now, on his code status, at this time his desire is to be full.  PROGNOSIS:  Guarded at this time.  Will continue to reevaluate him in the morning for any further testing or observation.  For now, we will ensure that he is on fall precautions.  Total time spent with this family is from 11:30 p.m. to 12:20 a.m.     Ermel Verne L. Ladona Ridgel, MD     MLT/MEDQ  D:  08/16/2010  T:  08/16/2010  Job:  [id number]  cc:   [name], M.D. Fax: [phone]  Electronically Signed by Derenda Mis MD on 09/03/2010 06:22:30 PM

## 2010-09-06 ENCOUNTER — Ambulatory Visit (INDEPENDENT_AMBULATORY_CARE_PROVIDER_SITE_OTHER): Payer: Medicare Other | Admitting: *Deleted

## 2010-09-06 DIAGNOSIS — I4891 Unspecified atrial fibrillation: Secondary | ICD-10-CM

## 2010-09-30 ENCOUNTER — Encounter: Payer: Self-pay | Admitting: Cardiovascular Disease

## 2010-10-03 ENCOUNTER — Ambulatory Visit (INDEPENDENT_AMBULATORY_CARE_PROVIDER_SITE_OTHER): Payer: Medicare Other | Admitting: *Deleted

## 2010-10-03 ENCOUNTER — Encounter: Payer: Self-pay | Admitting: Cardiovascular Disease

## 2010-10-03 ENCOUNTER — Ambulatory Visit (INDEPENDENT_AMBULATORY_CARE_PROVIDER_SITE_OTHER): Payer: Medicare Other | Admitting: Cardiovascular Disease

## 2010-10-03 VITALS — BP 148/72 | HR 57 | Ht 60.0 in | Wt 190.0 lb

## 2010-10-03 DIAGNOSIS — I10 Essential (primary) hypertension: Secondary | ICD-10-CM

## 2010-10-03 DIAGNOSIS — I4891 Unspecified atrial fibrillation: Secondary | ICD-10-CM

## 2010-10-03 DIAGNOSIS — E785 Hyperlipidemia, unspecified: Secondary | ICD-10-CM

## 2010-10-03 DIAGNOSIS — I2510 Atherosclerotic heart disease of native coronary artery without angina pectoris: Secondary | ICD-10-CM

## 2010-10-03 MED ORDER — ROSUVASTATIN CALCIUM 10 MG PO TABS: 10.0000 mg | ORAL_TABLET | Freq: Every day | ORAL | Status: DC

## 2010-10-03 MED ORDER — METFORMIN HCL 500 MG PO TABS: 1000.0000 mg | ORAL_TABLET | Freq: Two times a day (BID) | ORAL | Status: DC

## 2010-10-03 MED ORDER — LISINOPRIL 40 MG PO TABS: 40.0000 mg | ORAL_TABLET | Freq: Every day | ORAL | Status: DC

## 2010-10-03 MED ORDER — FINASTERIDE 5 MG PO TABS: 5.0000 mg | ORAL_TABLET | Freq: Every day | ORAL | Status: DC

## 2010-10-03 MED ORDER — AMLODIPINE BESYLATE 10 MG PO TABS: 10.0000 mg | ORAL_TABLET | Freq: Every day | ORAL | Status: DC

## 2010-10-03 MED ORDER — GEMFIBROZIL 600 MG PO TABS: 600.0000 mg | ORAL_TABLET | Freq: Two times a day (BID) | ORAL | Status: DC

## 2010-10-03 MED ORDER — ATENOLOL 100 MG PO TABS: 100.0000 mg | ORAL_TABLET | Freq: Every day | ORAL | Status: DC

## 2010-10-03 MED ORDER — GLIPIZIDE 10 MG PO TABS: 10.0000 mg | ORAL_TABLET | Freq: Two times a day (BID) | ORAL | Status: DC

## 2010-10-03 MED ORDER — INDAPAMIDE 2.5 MG PO TABS: 2.5000 mg | ORAL_TABLET | Freq: Two times a day (BID) | ORAL | Status: DC

## 2010-10-03 NOTE — Assessment & Plan Note (Signed)
Stable. No changes. Continue current therapy.  

## 2010-10-03 NOTE — Assessment & Plan Note (Signed)
Elevated today. He will check in his facility a few times this week and let us know if it remains elevated.

## 2010-10-03 NOTE — Assessment & Plan Note (Signed)
Continue statin. Will check lipids, LFTs and BMET next week.

## 2010-10-03 NOTE — Patient Instructions (Signed)
Your physician recommends that you schedule a follow-up appointment in: 6 months  Fasting Labs next week 10/09/10.

## 2010-10-03 NOTE — Progress Notes (Signed)
History of Present Illness:77 yo male with history of CAD, atrial fibrillation here today for cardiac follow up.  He has had multiple PCI procedures and has stents in all 3 vessels. His last PCI was in 2006 at which time he had overlapping stents placed in the circumflex artery. He also has a history of paroxysmal atrial fibrillation which is managed with rate control and Coumadin. Lower ext arterial studies June 2011 with ABI of 1.3 bilaterally.   He has been doing well recently. He has had no chest pain,  shortness of breath or palpitations. He has recently had pneumonia. He seems to be doing better from that standpoint.   Dr. [name] is his primary care physician. He is in the [facility]   Past Medical History  Diagnosis Date  . DM (diabetes mellitus), type 2   . Spinal stenosis   . Glaucoma   . Gout   . Macular degeneration   . Obesity   . Osteoporosis   . CAD (coronary artery disease)     s/p multiple PCIs in all 3 vessels with DES. Last PCI 2005 with overlapping Cypher stents in CFX bifurcation lesion  . Paroxysmal atrial fibrillation   . HTN (hypertension)     not under good control  . Hyperlipidemia   . Sleep apnea   . Peptic ulcer disease     Past Surgical History  Procedure Date  . Thyroglossal duct cyst removed in jan or feb 2005   . Bilateral carpal tunnel surgery   . Cardiac catheterization     x4-5, PCIs  . Turp vaporization     Current Outpatient Prescriptions  Medication Sig Dispense Refill  . albuterol (PROVENTIL) (5 MG/ML) 0.5% nebulizer solution as needed.        Marland Kitchen alendronate (FOSAMAX) 40 MG tablet Take 40 mg by mouth every 7 (seven) days. Take with a full glass of water on an empty stomach.       Marland Kitchen amLODipine (NORVASC) 10 MG tablet Take 10 mg by mouth daily.        Marland Kitchen atenolol (TENORMIN) 100 MG tablet Take 100 mg by mouth daily.        . calcium-vitamin D (OSCAL 500/200 D-3) 500-200 MG-UNIT per tablet Take 1 tablet by mouth 2 (two) times daily with a  meal.        . finasteride (PROSCAR) 5 MG tablet Take 5 mg by mouth daily.        . furosemide (LASIX) 20 MG tablet Take 20 mg by mouth daily.        Marland Kitchen gemfibrozil (LOPID) 600 MG tablet Take 600 mg by mouth 2 (two) times daily before a meal.        . glipiZIDE (GLUCOTROL) 10 MG tablet Take 10 mg by mouth 2 (two) times daily before a meal.        . indapamide (LOZOL) 2.5 MG tablet Take 1 tablet (2.5 mg total) by mouth 2 (two) times daily.  60 tablet  6  . lisinopril (PRINIVIL,ZESTRIL) 40 MG tablet Take 40 mg by mouth daily.        . metFORMIN (GLUCOPHAGE) 500 MG tablet Take 1,000 mg by mouth 2 (two) times daily with a meal.        . Multiple Vitamins-Minerals (PRESERVISION/LUTEIN) CAPS Take 1 capsule by mouth 2 (two) times daily.        . nitroGLYCERIN (NITROSTAT) 0.4 MG SL tablet Place 0.4 mg under the tongue every 5 (five) minutes as needed.        Marland Kitchen  rosuvastatin (CRESTOR) 10 MG tablet Take 1 tab every other day and 1/2 tab on alternating days       . traZODone (DESYREL) 100 MG tablet Take 100 mg by mouth at bedtime.        Marland Kitchen warfarin (COUMADIN) 2.5 MG tablet Take 1 tablet (2.5 mg total) by mouth as directed.  120 tablet  1  . ziprasidone (GEODON) 80 MG capsule Take 80 mg by mouth at bedtime.          Not on File  History   Social History  . Marital Status: Married    Spouse Name: N/A    Number of Children: N/A  . Years of Education: N/A   Occupational History  . Not on file.   Social History Main Topics  . Smoking status: Never Smoker   . Smokeless tobacco: Not on file  . Alcohol Use: No  . Drug Use: No  . Sexually Active: Not on file   Other Topics Concern  . Not on file   Social History Narrative  . No narrative on file    Family History  Problem Relation Age of Onset  . Diabetes Mother     deceased 17  . Heart attack Father     deceased 4  . Heart attack Brother     deceased 6    Review of Systems:  As stated in the HPI and otherwise negative.   BP 148/72   Pulse 57  Ht 5' (1.524 m)  Wt 190 lb (86.183 kg)  BMI 37.11 kg/m2  Physical Examination: General: Well developed, well nourished, NAD HEENT: OP clear, mucus membranes moist SKIN: warm, dry. No rashes. Neuro: No focal deficits Musculoskeletal: Muscle strength 5/5 all ext Psychiatric: Mood and affect normal Neck: No JVD, no carotid bruits, no thyromegaly, no lymphadenopathy. Lungs:Clear bilaterally, no wheezes, rhonci, crackles Cardiovascular: Regular rate and rhythm. No murmurs, gallops or rubs. Abdomen:Soft. Bowel sounds present. Non-tender.  Extremities: No lower extremity edema. Pulses are 2 + in the bilateral DP/PT.  AVW:UJWJX brady, rate 57 bpm.

## 2010-10-04 ENCOUNTER — Other Ambulatory Visit: Payer: Self-pay | Admitting: *Deleted

## 2010-10-04 MED ORDER — AMLODIPINE BESYLATE 10 MG PO TABS: 10.0000 mg | ORAL_TABLET | Freq: Every day | ORAL | Status: DC

## 2010-10-04 NOTE — Telephone Encounter (Signed)
norvasc 10 sent to [facility] today # [phone]. Danielle Rankin

## 2010-10-09 ENCOUNTER — Other Ambulatory Visit (INDEPENDENT_AMBULATORY_CARE_PROVIDER_SITE_OTHER): Payer: Medicare Other | Admitting: *Deleted

## 2010-10-09 DIAGNOSIS — E785 Hyperlipidemia, unspecified: Secondary | ICD-10-CM

## 2010-10-09 DIAGNOSIS — I2510 Atherosclerotic heart disease of native coronary artery without angina pectoris: Secondary | ICD-10-CM

## 2010-10-09 LAB — BASIC METABOLIC PANEL
CO2: 28 mEq/L (ref 19–32)
Calcium: 9.3 mg/dL (ref 8.4–10.5)
Glucose, Bld: 127 mg/dL — ABNORMAL HIGH (ref 70–99)
Sodium: 140 mEq/L (ref 135–145)

## 2010-10-09 LAB — LIPID PANEL
HDL: 43.2 mg/dL (ref >39.00)
Total CHOL/HDL Ratio: 3

## 2010-10-10 ENCOUNTER — Other Ambulatory Visit (HOSPITAL_COMMUNITY): Payer: Medicare Other | Admitting: Radiology

## 2010-10-10 LAB — HEPATIC FUNCTION PANEL
AST: 22 U/L (ref 0–37)
Alkaline Phosphatase: 55 U/L (ref 39–117)
Bilirubin, Direct: 0.1 mg/dL (ref 0.0–0.3)
Total Bilirubin: 0.7 mg/dL (ref 0.3–1.2)

## 2010-10-21 ENCOUNTER — Ambulatory Visit (INDEPENDENT_AMBULATORY_CARE_PROVIDER_SITE_OTHER): Payer: Medicare Other | Admitting: *Deleted

## 2010-10-21 DIAGNOSIS — I4891 Unspecified atrial fibrillation: Secondary | ICD-10-CM

## 2010-11-04 ENCOUNTER — Ambulatory Visit (INDEPENDENT_AMBULATORY_CARE_PROVIDER_SITE_OTHER): Payer: Medicare Other | Admitting: *Deleted

## 2010-11-04 DIAGNOSIS — I4891 Unspecified atrial fibrillation: Secondary | ICD-10-CM

## 2010-12-02 ENCOUNTER — Ambulatory Visit (INDEPENDENT_AMBULATORY_CARE_PROVIDER_SITE_OTHER): Payer: Medicare Other | Admitting: *Deleted

## 2010-12-02 DIAGNOSIS — I4891 Unspecified atrial fibrillation: Secondary | ICD-10-CM

## 2010-12-02 LAB — POCT INR: INR: 3.1

## 2010-12-30 ENCOUNTER — Ambulatory Visit (INDEPENDENT_AMBULATORY_CARE_PROVIDER_SITE_OTHER): Payer: Medicare Other | Admitting: *Deleted

## 2010-12-30 ENCOUNTER — Encounter: Payer: Medicare Other | Admitting: *Deleted

## 2010-12-30 DIAGNOSIS — Z7901 Long term (current) use of anticoagulants: Secondary | ICD-10-CM

## 2010-12-30 DIAGNOSIS — I4891 Unspecified atrial fibrillation: Secondary | ICD-10-CM

## 2011-01-20 ENCOUNTER — Other Ambulatory Visit: Payer: Self-pay | Admitting: Internal Medicine

## 2011-01-27 ENCOUNTER — Ambulatory Visit (INDEPENDENT_AMBULATORY_CARE_PROVIDER_SITE_OTHER): Payer: Medicare Other | Admitting: *Deleted

## 2011-01-27 DIAGNOSIS — Z7901 Long term (current) use of anticoagulants: Secondary | ICD-10-CM

## 2011-01-27 DIAGNOSIS — I4891 Unspecified atrial fibrillation: Secondary | ICD-10-CM

## 2011-02-24 ENCOUNTER — Ambulatory Visit (INDEPENDENT_AMBULATORY_CARE_PROVIDER_SITE_OTHER): Payer: Medicare Other | Admitting: *Deleted

## 2011-02-24 DIAGNOSIS — I4891 Unspecified atrial fibrillation: Secondary | ICD-10-CM

## 2011-02-24 DIAGNOSIS — Z7901 Long term (current) use of anticoagulants: Secondary | ICD-10-CM

## 2011-02-24 LAB — POCT INR: INR: 2.5

## 2011-03-15 ENCOUNTER — Encounter (HOSPITAL_COMMUNITY): Payer: Self-pay | Admitting: Emergency Medicine

## 2011-03-15 ENCOUNTER — Emergency Department (HOSPITAL_COMMUNITY)
Admission: EM | Admit: 2011-03-15 | Discharge: 2011-03-15 | Disposition: A | Discharge status: Home / Self Care | Payer: Medicare Other | Attending: Emergency Medicine | Admitting: Emergency Medicine

## 2011-03-15 ENCOUNTER — Emergency Department (HOSPITAL_COMMUNITY): Payer: Medicare Other

## 2011-03-15 DIAGNOSIS — I2510 Atherosclerotic heart disease of native coronary artery without angina pectoris: Secondary | ICD-10-CM | POA: Insufficient documentation

## 2011-03-15 DIAGNOSIS — W19XXXA Unspecified fall, initial encounter: Secondary | ICD-10-CM | POA: Insufficient documentation

## 2011-03-15 DIAGNOSIS — M109 Gout, unspecified: Secondary | ICD-10-CM | POA: Insufficient documentation

## 2011-03-15 DIAGNOSIS — Z7901 Long term (current) use of anticoagulants: Secondary | ICD-10-CM | POA: Insufficient documentation

## 2011-03-15 DIAGNOSIS — E119 Type 2 diabetes mellitus without complications: Secondary | ICD-10-CM | POA: Insufficient documentation

## 2011-03-15 DIAGNOSIS — H3530 Unspecified macular degeneration: Secondary | ICD-10-CM | POA: Insufficient documentation

## 2011-03-15 DIAGNOSIS — H409 Unspecified glaucoma: Secondary | ICD-10-CM | POA: Insufficient documentation

## 2011-03-15 DIAGNOSIS — M810 Age-related osteoporosis without current pathological fracture: Secondary | ICD-10-CM | POA: Insufficient documentation

## 2011-03-15 DIAGNOSIS — M549 Dorsalgia, unspecified: Secondary | ICD-10-CM

## 2011-03-15 DIAGNOSIS — E785 Hyperlipidemia, unspecified: Secondary | ICD-10-CM | POA: Insufficient documentation

## 2011-03-15 DIAGNOSIS — I4891 Unspecified atrial fibrillation: Secondary | ICD-10-CM | POA: Insufficient documentation

## 2011-03-15 DIAGNOSIS — Z79899 Other long term (current) drug therapy: Secondary | ICD-10-CM | POA: Insufficient documentation

## 2011-03-15 DIAGNOSIS — Z8711 Personal history of peptic ulcer disease: Secondary | ICD-10-CM | POA: Insufficient documentation

## 2011-03-15 DIAGNOSIS — G4730 Sleep apnea, unspecified: Secondary | ICD-10-CM | POA: Insufficient documentation

## 2011-03-15 DIAGNOSIS — E669 Obesity, unspecified: Secondary | ICD-10-CM | POA: Insufficient documentation

## 2011-03-15 DIAGNOSIS — I10 Essential (primary) hypertension: Secondary | ICD-10-CM | POA: Insufficient documentation

## 2011-03-15 MED ORDER — OXYCODONE-ACETAMINOPHEN 5-325 MG PO TABS
1.0000 | ORAL_TABLET | Freq: Once | ORAL | Status: AC
  Administered 2011-03-15: 1 via ORAL
  Filled 2011-03-15: qty 1

## 2011-03-15 MED ORDER — OXYCODONE-ACETAMINOPHEN 5-325 MG PO TABS: 1.0000 | ORAL_TABLET | ORAL | Status: AC | PRN

## 2011-03-15 NOTE — ED Notes (Signed)
AOZ:[id number]<VH> Expected date:03/15/11<BR> Expected time: 9:19 AM<BR> Means of arrival:<BR> Comments:<BR> EMS fall on Thursday

## 2011-03-15 NOTE — ED Notes (Signed)
Patient given discharge instructions, information, prescriptions, and diet order. Patient states that they adequately understand discharge information given and to return to ED if symptoms return or worsen.    Pt received 1 Rx.

## 2011-03-15 NOTE — ED Notes (Signed)
Patient fell on Thursday and is now having pain in the lower left of back.  Denies numbness/tingling.  Has increased pain with movement.

## 2011-03-16 NOTE — ED Provider Notes (Addendum)
History     CSN: [id number]  Arrival date & time 03/15/11  [id number]   First MD Initiated Contact with Patient 03/15/11 [phone]      Chief Complaint  Patient presents with  . Fall    (Consider location/radiation/quality/duration/timing/severity/associated sxs/prior treatment) Patient is a 78 y.o. male presenting with fall. The history is provided by the patient (pt fell and has lower back pain). No language interpreter was used.  Fall The accident occurred 6 to 12 hours ago. The fall occurred while walking. He fell from a height of 1 to 2 ft. He landed on a hard floor. Point of impact: back. Pain location: back. The pain is at a severity of 8/10. The pain is moderate. He was ambulatory at the scene. There was no entrapment after the fall. There was no drug use involved in the accident. There was no alcohol use involved in the accident. Pertinent negatives include no visual change, no fever, no abdominal pain, no hematuria and no headaches.    Past Medical History  Diagnosis Date  . DM (diabetes mellitus), type 2   . Spinal stenosis   . Glaucoma   . Gout   . Macular degeneration   . Obesity   . Osteoporosis   . CAD (coronary artery disease)     s/p multiple PCIs in all 3 vessels with DES. Last PCI 2005 with overlapping Cypher stents in CFX bifurcation lesion  . Paroxysmal atrial fibrillation   . HTN (hypertension)     not under good control  . Hyperlipidemia   . Sleep apnea   . Peptic ulcer disease     Past Surgical History  Procedure Date  . Thyroglossal duct cyst removed in jan or feb 2005   . Bilateral carpal tunnel surgery   . Cardiac catheterization     x4-5, PCIs  . Turp vaporization     Family History  Problem Relation Age of Onset  . Diabetes Mother     deceased 2  . Heart attack Father     deceased 74  . Heart attack Brother     deceased 69    History  Substance Use Topics  . Smoking status: Former Smoker    Types: Cigarettes    Quit date: 10/02/1984  .  Smokeless tobacco: Not on file  . Alcohol Use: No      Review of Systems  Constitutional: Negative for fever and fatigue.  HENT: Negative for congestion, sinus pressure and ear discharge.   Eyes: Negative for discharge.  Respiratory: Negative for cough.   Cardiovascular: Negative for chest pain.  Gastrointestinal: Negative for abdominal pain and diarrhea.  Genitourinary: Negative for frequency and hematuria.  Musculoskeletal: Positive for back pain.  Skin: Negative for rash.  Neurological: Negative for seizures and headaches.  Hematological: Negative.   Psychiatric/Behavioral: Negative for hallucinations.    Allergies  Review of patient's allergies indicates no known allergies.  Home Medications   Current Outpatient Rx  Name Route Sig Dispense Refill  . ACYCLOVIR 400 MG PO TABS Oral Take 400 mg by mouth 2 (two) times daily.     . ALBUTEROL SULFATE (5 MG/ML) 0.5% IN NEBU Nebulization Take 2.5 mg by nebulization every 4 (four) hours as needed.     . ALENDRONATE SODIUM 70 MG PO TABS Oral Take 70 mg by mouth every 7 (seven) days. On Wednesdays. Take with a full glass of water on an empty stomach.     . ALLOPURINOL 300 MG PO TABS Oral Take  300 mg by mouth daily.      . ATENOLOL 100 MG PO TABS Oral Take 1 tablet (100 mg total) by mouth daily. 30 tablet 8  . CALCIUM CARBONATE-VITAMIN D 500-200 MG-UNIT PO TABS Oral Take 1 tablet by mouth 2 (two) times daily with a meal.      . CARBOXYMETHYLCELLULOSE SODIUM 0.25 % OP SOLN Ophthalmic Apply 1 drop to eye 4 (four) times daily.      Marland Kitchen CITALOPRAM HYDROBROMIDE 40 MG PO TABS Oral Take 40 mg by mouth 2 (two) times daily.     Marland Kitchen FINASTERIDE 5 MG PO TABS Oral Take 1 tablet (5 mg total) by mouth daily. 30 tablet 8  . GEMFIBROZIL 600 MG PO TABS Oral Take 1 tablet (600 mg total) by mouth 2 (two) times daily before a meal. 60 tablet 8  . GLIPIZIDE 10 MG PO TABS Oral Take 1 tablet (10 mg total) by mouth 2 (two) times daily before a meal. 60 tablet 2  .  INDAPAMIDE 2.5 MG PO TABS Oral Take 2.5 mg by mouth 2 (two) times daily.      . IPRATROPIUM BROMIDE 0.02 % IN SOLN Nebulization Take 500 mcg by nebulization daily.      Marland Kitchen LISINOPRIL 40 MG PO TABS Oral Take 40 mg by mouth daily.      Marland Kitchen METFORMIN HCL 500 MG PO TABS Oral Take 500 mg by mouth 2 (two) times daily with a meal.      . ADULT MULTIVITAMIN W/MINERALS CH Oral Take 1 tablet by mouth daily.      Marland Kitchen PRESERVISION/LUTEIN PO CAPS Oral Take 1 capsule by mouth 2 (two) times daily.      Marland Kitchen PRESCRIPTION MEDICATION Both Eyes Place 1 drop into both eyes daily. Unknown eye drop for Macular Degeneration. Patient uses [facility].     . ROSUVASTATIN CALCIUM 20 MG PO TABS Oral Take 10 mg by mouth at bedtime. Patient takes 1/2 of 20mg  tablet every evening     . TRAZODONE HCL 100 MG PO TABS Oral Take 100 mg by mouth at bedtime.     . WARFARIN SODIUM 2.5 MG PO TABS Oral Take 2.5-3.75 mg by mouth daily. Patient takes 1 tablet by mouth every morning except for Saturday and he takes 1 and 1/2 tablets.    Marland Kitchen NITROGLYCERIN 0.4 MG SL SUBL Sublingual Place 0.4 mg under the tongue every 5 (five) minutes as needed.      . OXYCODONE-ACETAMINOPHEN 5-325 MG PO TABS Oral Take 1 tablet by mouth every 4 (four) hours as needed for pain. 30 tablet 0    BP 106/84  Pulse 97  Temp(Src) 98.6 F (37 C) (Oral)  Resp 16  SpO2 93%  Physical Exam  Constitutional: He is oriented to person, place, and time. He appears well-developed.  HENT:  Head: Normocephalic and atraumatic.  Eyes: Conjunctivae and EOM are normal. No scleral icterus.  Neck: Neck supple. No thyromegaly present.  Cardiovascular: Normal rate and regular rhythm.  Exam reveals no gallop and no friction rub.   No murmur heard. Pulmonary/Chest: No stridor. He has no wheezes. He has no rales. He exhibits no tenderness.  Abdominal: He exhibits no distension. There is no tenderness. There is no rebound.  Musculoskeletal: Normal range of motion. He exhibits no edema.        Tender lumbar spine  Lymphadenopathy:    He has no cervical adenopathy.  Neurological: He is oriented to person, place, and time. Coordination normal.  Skin:  No rash noted. No erythema.  Psychiatric: He has a normal mood and affect. His behavior is normal.    ED Course  Procedures (including critical care time)  Labs Reviewed - No data to display Dg Lumbar Spine Complete  03/15/2011  *RADIOLOGY REPORT*  Clinical Data: Back pain after fall  LUMBAR SPINE - COMPLETE 4+ VIEW  Comparison: None.  Findings: There is loss of vertebral body height at L2 with a suggestion of a central linear lucency raise the question of an acute compression fracture.  There is grade 1 anterolisthesis of L4 on L5.  The disc spaces are maintained.  The pedicles and spinous processes are intact.  No pars interarticularis defects are identified.  There is facet joint sclerosis at the L5-S1 bilaterally.  IMPRESSION: Loss of vertebral body height at L2 with subtle linear lucency, worrisome for acute compression fracture.  There is no evidence of retropulsion of  bone posteriorly into the central canal. MRI may be helpful for better characterization, however.a  Grade 1 anterolisthesis of L4 on L5.  Original Report Authenticated By: Brandon Melnick, M.D.     1. Back pain   2. Fall       MDM          Benny Lennert, MD 03/16/11 1610  Benny Lennert, MD 04/15/11 [phone]

## 2011-04-04 ENCOUNTER — Ambulatory Visit (INDEPENDENT_AMBULATORY_CARE_PROVIDER_SITE_OTHER): Payer: Medicare Other | Admitting: Cardiovascular Disease

## 2011-04-04 ENCOUNTER — Ambulatory Visit (INDEPENDENT_AMBULATORY_CARE_PROVIDER_SITE_OTHER): Payer: Medicare Other | Admitting: Pharmacist

## 2011-04-04 ENCOUNTER — Encounter: Payer: Self-pay | Admitting: Cardiovascular Disease

## 2011-04-04 VITALS — BP 126/82 | HR 80 | Ht 68.0 in | Wt 188.0 lb

## 2011-04-04 DIAGNOSIS — I4891 Unspecified atrial fibrillation: Secondary | ICD-10-CM

## 2011-04-04 DIAGNOSIS — I2510 Atherosclerotic heart disease of native coronary artery without angina pectoris: Secondary | ICD-10-CM

## 2011-04-04 LAB — POCT INR: INR: 4.2

## 2011-04-04 MED ORDER — RIVAROXABAN 20 MG PO TABS: 20.0000 mg | ORAL_TABLET | Freq: Every day | ORAL | Status: DC

## 2011-04-04 NOTE — Patient Instructions (Signed)
Your physician wants you to follow-up in: 6 months.  You will receive a reminder letter in the mail two months in advance. If you don't receive a letter, please call our office to schedule the follow-up appointment.  Your physician recommends that you return for lab work on Monday, April 07, 2011--BMP,CBC, PT/INR  Your physician has recommended you make the following change in your medication: Stop Coumadin. Have lab work done on Monday. We will call you with results and have you start Xarelto 20 mg by mouth daily if lab work OK.

## 2011-04-04 NOTE — Assessment & Plan Note (Signed)
Stable.  NO changes. 

## 2011-04-04 NOTE — Progress Notes (Signed)
History of Present Illness: 78 yo male with history of CAD, atrial fibrillation here today for cardiac follow up. He has had multiple PCI procedures and has stents in all 3 vessels. His last PCI was in 2006 at which time he had overlapping stents placed in the circumflex artery. He also has a history of paroxysmal atrial fibrillation which is managed with rate control and Coumadin. Lower ext arterial studies June 2011 with ABI of 1.3 bilaterally.   He has been doing well recently. He did fall and hurt his back. He has had no chest pain, shortness of breath or palpitations.   Dr. [name] is his primary care physician. He is in the [facility].    Past Medical History  Diagnosis Date  . DM (diabetes mellitus), type 2   . Spinal stenosis   . Glaucoma   . Gout   . Macular degeneration   . Obesity   . Osteoporosis   . CAD (coronary artery disease)     s/p multiple PCIs in all 3 vessels with DES. Last PCI 2005 with overlapping Cypher stents in CFX bifurcation lesion  . Paroxysmal atrial fibrillation   . HTN (hypertension)     not under good control  . Hyperlipidemia   . Sleep apnea   . Peptic ulcer disease     Past Surgical History  Procedure Date  . Thyroglossal duct cyst removed in jan or feb 2005   . Bilateral carpal tunnel surgery   . Cardiac catheterization     x4-5, PCIs  . Turp vaporization     Current Outpatient Prescriptions  Medication Sig Dispense Refill  . acyclovir (ZOVIRAX) 400 MG tablet Take 400 mg by mouth 2 (two) times daily.       Marland Kitchen albuterol (PROVENTIL) (5 MG/ML) 0.5% nebulizer solution Take 2.5 mg by nebulization every 4 (four) hours as needed.       Marland Kitchen alendronate (FOSAMAX) 70 MG tablet Take 70 mg by mouth every 7 (seven) days. On Wednesdays. Take with a full glass of water on an empty stomach.       Marland Kitchen allopurinol (ZYLOPRIM) 300 MG tablet Take 300 mg by mouth daily.        Marland Kitchen amLODipine (NORVASC) 10 MG tablet 1 tab daily      . atenolol (TENORMIN) 100 MG  tablet Take 1 tablet (100 mg total) by mouth daily.  30 tablet  8  . calcium-vitamin D (OSCAL 500/200 D-3) 500-200 MG-UNIT per tablet Take 1 tablet by mouth 2 (two) times daily with a meal.        . Carboxymethylcellulose Sodium 0.25 % SOLN Apply 1 drop to eye 4 (four) times daily.        . citalopram (CELEXA) 40 MG tablet Take 40 mg by mouth 2 (two) times daily.       . finasteride (PROSCAR) 5 MG tablet Take 1 tablet (5 mg total) by mouth daily.  30 tablet  8  . gemfibrozil (LOPID) 600 MG tablet Take 1 tablet (600 mg total) by mouth 2 (two) times daily before a meal.  60 tablet  8  . glipiZIDE (GLUCOTROL) 10 MG tablet Take 1 tablet (10 mg total) by mouth 2 (two) times daily before a meal.  60 tablet  2  . HYDROcodone-acetaminophen (VICODIN) 5-500 MG per tablet As directed      . indapamide (LOZOL) 2.5 MG tablet Take 2.5 mg by mouth 2 (two) times daily.        Marland Kitchen  ipratropium (ATROVENT) 0.02 % nebulizer solution Take 500 mcg by nebulization daily.        Marland Kitchen lisinopril (PRINIVIL,ZESTRIL) 40 MG tablet Take 40 mg by mouth daily.        . metFORMIN (GLUCOPHAGE) 500 MG tablet Take 500 mg by mouth 2 (two) times daily with a meal.        . Multiple Vitamin (MULITIVITAMIN WITH MINERALS) TABS Take 1 tablet by mouth daily.        . Multiple Vitamins-Minerals (PRESERVISION/LUTEIN) CAPS Take 1 capsule by mouth 2 (two) times daily.        . nitroGLYCERIN (NITROSTAT) 0.4 MG SL tablet Place 0.4 mg under the tongue every 5 (five) minutes as needed.        Marland Kitchen oxyCODONE-acetaminophen (PERCOCET) 5-325 MG per tablet As directed      . PRESCRIPTION MEDICATION Place 1 drop into both eyes daily. Unknown eye drop for Macular Degeneration. Patient uses [facility].       . rosuvastatin (CRESTOR) 20 MG tablet Take 10 mg by mouth at bedtime. Patient takes 1/2 of 20mg  tablet every evening       . traZODone (DESYREL) 100 MG tablet Take 100 mg by mouth at bedtime.       Marland Kitchen warfarin (COUMADIN) 2.5 MG tablet Take 2.5-3.75 mg by mouth  daily. Patient takes 1 tablet by mouth every morning except for Saturday and he takes 1 and 1/2 tablets.        No Known Allergies  History   Social History  . Marital Status: Married    Spouse Name: N/A    Number of Children: N/A  . Years of Education: N/A   Occupational History  . Not on file.   Social History Main Topics  . Smoking status: Former Smoker    Types: Cigarettes    Quit date: 10/02/1984  . Smokeless tobacco: Not on file  . Alcohol Use: No  . Drug Use: No  . Sexually Active: Not on file   Other Topics Concern  . Not on file   Social History Narrative  . No narrative on file    Family History  Problem Relation Age of Onset  . Diabetes Mother     deceased 84  . Heart attack Father     deceased 58  . Heart attack Brother     deceased 25    Review of Systems:  As stated in the HPI and otherwise negative.   BP 126/82  Pulse 80  Ht 5\' 8"  (1.727 m)  Wt 188 lb (85.276 kg)  BMI 28.59 kg/m2  Physical Examination: General: Well developed, well nourished, NAD HEENT: OP clear, mucus membranes moist SKIN: warm, dry. No rashes. Neuro: No focal deficits Musculoskeletal: Muscle strength 5/5 all ext Psychiatric: Mood and affect normal Neck: No JVD, no carotid bruits, no thyromegaly, no lymphadenopathy. Lungs:Clear bilaterally, no wheezes, rhonci, crackles Cardiovascular: Regular rate and rhythm. No murmurs, gallops or rubs. Abdomen:Soft. Bowel sounds present. Non-tender.  Extremities: No lower extremity edema. Pulses are 2 + in the bilateral DP/PT.  EKG:

## 2011-04-04 NOTE — Assessment & Plan Note (Signed)
Rate controlled. He is on coumadin. INR of 4 today. He wants to try Xarelto. Will hold coumadin for three days then check INR, CBC and BMET next week. If INR less than 3, start Xarelto 20mg  po Qdaily. Rate controlled well.

## 2011-04-07 ENCOUNTER — Other Ambulatory Visit (INDEPENDENT_AMBULATORY_CARE_PROVIDER_SITE_OTHER): Payer: Medicare Other | Admitting: *Deleted

## 2011-04-07 DIAGNOSIS — I4891 Unspecified atrial fibrillation: Secondary | ICD-10-CM

## 2011-04-07 LAB — CBC WITH DIFFERENTIAL/PLATELET
Basophils Relative: 0.4 % (ref 0.0–3.0)
Hemoglobin: 13 g/dL (ref 13.0–17.0)
Lymphocytes Relative: 21.1 % (ref 12.0–46.0)
MCHC: 33.8 g/dL (ref 30.0–36.0)
Monocytes Relative: 7.6 % (ref 3.0–12.0)
Neutro Abs: 7 10*3/uL (ref 1.4–7.7)
RBC: 4.54 Mil/uL (ref 4.22–5.81)

## 2011-04-07 LAB — BASIC METABOLIC PANEL
CO2: 25 mEq/L (ref 19–32)
Glucose, Bld: 244 mg/dL — ABNORMAL HIGH (ref 70–99)
Potassium: 4 mEq/L (ref 3.5–5.1)
Sodium: 140 mEq/L (ref 135–145)

## 2011-04-18 ENCOUNTER — Encounter: Payer: Medicare Other | Admitting: *Deleted

## 2011-04-27 ENCOUNTER — Other Ambulatory Visit: Payer: Self-pay

## 2011-04-27 ENCOUNTER — Emergency Department (HOSPITAL_COMMUNITY)
Admission: EM | Admit: 2011-04-27 | Discharge: 2011-04-27 | Disposition: A | Discharge status: Home / Self Care | Payer: Medicare Other | Attending: Emergency Medicine | Admitting: Emergency Medicine

## 2011-04-27 ENCOUNTER — Emergency Department (HOSPITAL_COMMUNITY): Payer: Medicare Other

## 2011-04-27 ENCOUNTER — Encounter (HOSPITAL_COMMUNITY): Payer: Self-pay

## 2011-04-27 DIAGNOSIS — R071 Chest pain on breathing: Secondary | ICD-10-CM | POA: Insufficient documentation

## 2011-04-27 DIAGNOSIS — Y9301 Activity, walking, marching and hiking: Secondary | ICD-10-CM | POA: Insufficient documentation

## 2011-04-27 DIAGNOSIS — S61309A Unspecified open wound of unspecified finger with damage to nail, initial encounter: Secondary | ICD-10-CM

## 2011-04-27 DIAGNOSIS — W101XXA Fall (on)(from) sidewalk curb, initial encounter: Secondary | ICD-10-CM | POA: Insufficient documentation

## 2011-04-27 DIAGNOSIS — S0181XA Laceration without foreign body of other part of head, initial encounter: Secondary | ICD-10-CM

## 2011-04-27 DIAGNOSIS — S0180XA Unspecified open wound of other part of head, initial encounter: Secondary | ICD-10-CM | POA: Insufficient documentation

## 2011-04-27 DIAGNOSIS — S02400A Malar fracture unspecified, initial encounter for closed fracture: Secondary | ICD-10-CM | POA: Insufficient documentation

## 2011-04-27 DIAGNOSIS — S61209A Unspecified open wound of unspecified finger without damage to nail, initial encounter: Secondary | ICD-10-CM | POA: Insufficient documentation

## 2011-04-27 DIAGNOSIS — W19XXXA Unspecified fall, initial encounter: Secondary | ICD-10-CM

## 2011-04-27 DIAGNOSIS — Y92410 Unspecified street and highway as the place of occurrence of the external cause: Secondary | ICD-10-CM | POA: Insufficient documentation

## 2011-04-27 DIAGNOSIS — M25539 Pain in unspecified wrist: Secondary | ICD-10-CM | POA: Insufficient documentation

## 2011-04-27 DIAGNOSIS — S02401A Maxillary fracture, unspecified, initial encounter for closed fracture: Secondary | ICD-10-CM | POA: Insufficient documentation

## 2011-04-27 DIAGNOSIS — Y998 Other external cause status: Secondary | ICD-10-CM | POA: Insufficient documentation

## 2011-04-27 LAB — BASIC METABOLIC PANEL
BUN: 17 mg/dL (ref 6–23)
Chloride: 96 mEq/L (ref 96–112)
Creatinine, Ser: 1.05 mg/dL (ref 0.50–1.35)
GFR calc Af Amer: 74 mL/min — ABNORMAL LOW (ref >90)

## 2011-04-27 LAB — DIFFERENTIAL
Basophils Relative: 0 % (ref 0–1)
Eosinophils Absolute: 0.3 10*3/uL (ref 0.0–0.7)
Monocytes Absolute: 0.8 10*3/uL (ref 0.1–1.0)
Monocytes Relative: 7 % (ref 3–12)

## 2011-04-27 LAB — CBC
HCT: 37.8 % — ABNORMAL LOW (ref 39.0–52.0)
Hemoglobin: 13.2 g/dL (ref 13.0–17.0)
MCH: 28.5 pg (ref 26.0–34.0)
MCHC: 34.9 g/dL (ref 30.0–36.0)

## 2011-04-27 LAB — URINALYSIS, ROUTINE W REFLEX MICROSCOPIC
Bilirubin Urine: NEGATIVE
Hgb urine dipstick: NEGATIVE
Ketones, ur: NEGATIVE mg/dL
Nitrite: NEGATIVE
pH: 6.5 (ref 5.0–8.0)

## 2011-04-27 MED ORDER — MORPHINE SULFATE 4 MG/ML IJ SOLN
4.0000 mg | Freq: Once | INTRAMUSCULAR | Status: AC
  Administered 2011-04-27: 4 mg via INTRAVENOUS
  Filled 2011-04-27: qty 1

## 2011-04-27 MED ORDER — HYDROCODONE-ACETAMINOPHEN 5-325 MG PO TABS: 1.0000 | ORAL_TABLET | ORAL | Status: AC | PRN

## 2011-04-27 MED ORDER — AMOXICILLIN-POT CLAVULANATE 875-125 MG PO TABS: 1.0000 | ORAL_TABLET | Freq: Two times a day (BID) | ORAL | Status: AC

## 2011-04-27 NOTE — ED Notes (Signed)
Patient here for fall. Found prone by ems, has laceration to left eye, deformity to left wrist, hx of fx to same, pt with pinky on left with out nail, pt was walking with walker and tripped over curb.

## 2011-04-27 NOTE — Discharge Instructions (Signed)
Take your vicodin as needed for severe pain.  Be careful when your walking while on this medication because it may cause drowsiness.  Take antibiotic as prescribed. Follow up with Dr. Emeline Darling.  You may return to the ER if your symptoms worsen or you have any other concerns.

## 2011-04-27 NOTE — ED Provider Notes (Signed)
History     CSN: [id number]  Arrival date & time 04/27/11  1253   First MD Initiated Contact with Patient 04/27/11 1259      Chief Complaint  Patient presents with  . Fall  . Facial Laceration    (Consider location/radiation/quality/duration/timing/severity/associated sxs/prior treatment) HPI History provided by pt and his wife.  Pt reports that pt was walking outside with his walker after lunch today, tripped over the curb and fell flat on his face.  Pt does not remember falling.  Had LOC and woke while on the stretcher.  Sustained a laceration to left forehead.  Denies headache, dizziness, vision changes and N/V.  Anti-coagulated w/ Xarelto. Denies neck and back pain, though fractured L2 during a fall a couple weeks ago.  Has mildly pleuritic pain left lateral chest; denies SOB.  Also c/o pain right middle finger, left pinky finger and left wrist.    Past Medical History  Diagnosis Date  . DM (diabetes mellitus), type 2   . Spinal stenosis   . Glaucoma   . Gout   . Macular degeneration   . Obesity   . Osteoporosis   . CAD (coronary artery disease)     s/p multiple PCIs in all 3 vessels with DES. Last PCI 2005 with overlapping Cypher stents in CFX bifurcation lesion  . Paroxysmal atrial fibrillation   . HTN (hypertension)     not under good control  . Hyperlipidemia   . Sleep apnea   . Peptic ulcer disease     Past Surgical History  Procedure Date  . Thyroglossal duct cyst removed in jan or feb 2005   . Bilateral carpal tunnel surgery   . Cardiac catheterization     x4-5, PCIs  . Turp vaporization     Family History  Problem Relation Age of Onset  . Diabetes Mother     deceased 58  . Heart attack Father     deceased 57  . Heart attack Brother     deceased 48    History  Substance Use Topics  . Smoking status: Former Smoker    Types: Cigarettes    Quit date: 10/02/1984  . Smokeless tobacco: Not on file  . Alcohol Use: No      Review of Systems  All  other systems reviewed and are negative.    Allergies  Review of patient's allergies indicates no known allergies.  Home Medications   Current Outpatient Rx  Name Route Sig Dispense Refill  . ACYCLOVIR 400 MG PO TABS Oral Take 400 mg by mouth 2 (two) times daily.     . ALBUTEROL SULFATE (5 MG/ML) 0.5% IN NEBU Nebulization Take 2.5 mg by nebulization every 4 (four) hours as needed.     . ALENDRONATE SODIUM 70 MG PO TABS Oral Take 70 mg by mouth every 7 (seven) days. On Wednesdays. Take with a full glass of water on an empty stomach.     . ALLOPURINOL 300 MG PO TABS Oral Take 300 mg by mouth daily.      Marland Kitchen AMLODIPINE BESYLATE 10 MG PO TABS  1 tab daily    . ATENOLOL 100 MG PO TABS Oral Take 1 tablet (100 mg total) by mouth daily. 30 tablet 8  . CALCIUM CARBONATE-VITAMIN D 500-200 MG-UNIT PO TABS Oral Take 1 tablet by mouth 2 (two) times daily with a meal.      . CARBOXYMETHYLCELLULOSE SODIUM 0.25 % OP SOLN Ophthalmic Apply 1 drop to eye 4 (four) times  daily.      Marland Kitchen CITALOPRAM HYDROBROMIDE 40 MG PO TABS Oral Take 40 mg by mouth 2 (two) times daily.     Marland Kitchen FINASTERIDE 5 MG PO TABS Oral Take 1 tablet (5 mg total) by mouth daily. 30 tablet 8  . GEMFIBROZIL 600 MG PO TABS Oral Take 1 tablet (600 mg total) by mouth 2 (two) times daily before a meal. 60 tablet 8  . GLIPIZIDE 10 MG PO TABS Oral Take 1 tablet (10 mg total) by mouth 2 (two) times daily before a meal. 60 tablet 2  . HYDROCODONE-ACETAMINOPHEN 5-500 MG PO TABS  As directed    . INDAPAMIDE 2.5 MG PO TABS Oral Take 2.5 mg by mouth 2 (two) times daily.      . IPRATROPIUM BROMIDE 0.02 % IN SOLN Nebulization Take 500 mcg by nebulization daily.      Marland Kitchen LISINOPRIL 40 MG PO TABS Oral Take 40 mg by mouth daily.      Marland Kitchen METFORMIN HCL 500 MG PO TABS Oral Take 500 mg by mouth 2 (two) times daily with a meal.      . ADULT MULTIVITAMIN W/MINERALS CH Oral Take 1 tablet by mouth daily.      Marland Kitchen PRESERVISION/LUTEIN PO CAPS Oral Take 1 capsule by mouth 2  (two) times daily.      Marland Kitchen NITROGLYCERIN 0.4 MG SL SUBL Sublingual Place 0.4 mg under the tongue every 5 (five) minutes as needed.      . OXYCODONE-ACETAMINOPHEN 5-325 MG PO TABS  As directed    . PRESCRIPTION MEDICATION Both Eyes Place 1 drop into both eyes daily. Unknown eye drop for Macular Degeneration. Patient uses [facility].     . RIVAROXABAN 20 MG PO TABS Oral Take 20 mg by mouth daily. 90 tablet 3  . ROSUVASTATIN CALCIUM 20 MG PO TABS Oral Take 10 mg by mouth at bedtime. Patient takes 1/2 of 20mg  tablet every evening     . TRAZODONE HCL 100 MG PO TABS Oral Take 100 mg by mouth at bedtime.       BP 117/77  Pulse 79  Temp(Src) 98.8 F (37.1 C) (Oral)  Resp 18  SpO2 99%  Physical Exam  Nursing note and vitals reviewed. Constitutional: He is oriented to person, place, and time. He appears well-developed and well-nourished. No distress.  HENT:  Head: Normocephalic and atraumatic.  Mouth/Throat: Oropharynx is clear and moist.       Superficial, hemostatic laceration superior to left eyebrow.  Patient has full ROM of jaw w/out pain.    Eyes:       Normal appearance  Neck: Normal range of motion.  Cardiovascular: Normal rate, regular rhythm and intact distal pulses.   Pulmonary/Chest: Effort normal and breath sounds normal. No respiratory distress.       Tenderness left lateral chest wall.  No ecchymosis or abrasion.  Does not appear to be uncomfortable w/ deep inspiration.   Abdominal: Soft. Bowel sounds are normal. He exhibits no distension. There is no tenderness.  Musculoskeletal: Normal range of motion.       Entire spine non-tender.  Pelvis stable.  Full ROM LE w/out pain.  Left wrist w/ mild deformity that wife reports is chronic, stable and secondary to past trauma.  2+ radial pulse, full ROM of all fingers and distal sensation intact.  L fingernail completely avulsed.  Nailbed hemostatic and w/out laceration.   Neurological: He is alert and oriented to person, place, and  time. He has  normal reflexes. No cranial nerve deficit or sensory deficit. Coordination normal.       5/5 and equal upper and lower extremity strength.    Skin: Skin is warm and dry. No rash noted.  Psychiatric: He has a normal mood and affect. His behavior is normal.    ED Course  Procedures (including critical care time)   Date: 04/27/2011  Rate: 75  Rhythm: atrial fibrillation  QRS Axis: normal  Intervals: normal  ST/T Wave abnormalities: normal  Conduction Disutrbances:none  Narrative Interpretation:   Old EKG Reviewed: changes noted (atrial fib new since 2005 but pt has prior history)   Labs Reviewed  CBC - Abnormal; Notable for the following:    HCT 37.8 (*)    All other components within normal limits  BASIC METABOLIC PANEL - Abnormal; Notable for the following:    Glucose, Bld 156 (*)    GFR calc non Af Amer 64 (*)    GFR calc Af Amer 74 (*)    All other components within normal limits  DIFFERENTIAL  URINALYSIS, ROUTINE W REFLEX MICROSCOPIC   Dg Chest 2 View  04/27/2011  *RADIOLOGY REPORT*  Clinical Data: Hypertension, fell.  CHEST - 2 VIEW  Comparison: 08/30/2010  Findings: Left shoulder arthroplasty components are partially seen. Mild cardiomegaly.  Old healed left rib fractures.  Prominent pulmonary interstitial markings.  No focal infiltrate or overt edema.  No effusion.  Tortuous atheromatous thoracic aorta. No pneumothorax.  IMPRESSION:  1.  Mild cardiomegaly. 2.  No acute disease.  Original Report Authenticated By: Osa Craver, M.D.   Dg Wrist Complete Left  04/27/2011  *RADIOLOGY REPORT*  Clinical Data: Pain post fall.  LEFT WRIST - COMPLETE 3+ VIEW  Comparison: None.  Findings: There is fracture deformity of the distal radial metaphysis with neutral angulation of the distal radial articular surface.  However, no acute cortical interruption is evident suggesting this may represent  an old healed fracture.  There is some mild step-off deformity of the  distal radial subchondral cortex.  Distal ulna is intact.  Carpal rows intact.  There is moderately advanced degenerative change at the first [facility] articulation.  Diffuse osteopenia.  Patchy arterial vascular calcifications.  IMPRESSION:  1.  Fracture deformity of the distal radius as above without definite acute component. 2.  Osteopenia. 3.  Degenerative changes at the first [facility] articulation.  Original Report Authenticated By: Osa Craver, M.D.   Ct Head Wo Contrast  04/27/2011  *RADIOLOGY REPORT*  Clinical Data: Fall.  Facial laceration.  CT HEAD WITHOUT CONTRAST  Technique:  Contiguous axial images were obtained from the base of the skull through the vertex without contrast.  Comparison: MRI 08/13/2010.  Findings: The No mass lesion, mass effect, midline shift, hydrocephalus, hemorrhage.  No acute territorial cortical ischemia/infarct. Atrophy and chronic ischemic white matter disease is present.  Intracranial atherosclerosis.  Benign basal ganglia calcifications.  There is a left maxillary hemosinus with fluid level.  Disruption of the posterior wall of the left maxillary sinus is present. There is also a nondisplaced left zygomatic arch fracture.  Bilateral lens extractions are present.  Visualized nasal bones are intact.  Right maxillary sinus normal.  Rightward nasal septal deviation.  Sphenoid sinuses normal. Mastoid air cells clear.  IMPRESSION: 1.  Atrophy and chronic ischemic white matter disease without acute intracranial abnormality. 2.  Left maxillary hemosinus with nondisplaced left zygomatic arch and posterior left maxillary wall fracture.  If further evaluation will affect clinical management,  CT maxillofacial may be useful.  Original Report Authenticated By: Andreas Newport, M.D.     1. Fall   2. Laceration of forehead   3. Avulsion of fingernail       MDM  78yo M w/ poor vision secondary to macular degeneration, anti-coagulated on xarelto (chronic a fib) presents w/ fall.   It is unclear whether or not the fall was mechanical but patient hit his head and had LOC.  Sustained a superficial laceration to left forehead and c/o mild, pleuritic pain in left lateral chest w/out dyspnea as well as pain in left wrist and left pinky.  Left pinky nail avulsed.  Nursing staff cleaned w/ NS and bandaged.  No focal neuro deficits on exam and CT head neg for intracranial hemorrhage.  CT does however shows L zygomatic arch and left posterior maxillary sinus wall fx.  Consulted Dr. Emeline Darling and he is unimpressed w/ CT but will f/u with him on an outpatient basis.  CXR neg for rib fx but pt received an incentive spirometer.  Xray left wrist neg for acute fx.  Ortho tech placed in a velcrow wrist splint for comfort.  Pt d/c'd home w/ augmentin to prevent sinus infection and vicodin for pain (he takes this medication for recent lumbar spine fx but is almost out).  Advised him to f/u with his PCP and return to ER if any of his sx worsen.        Otilio Miu, [place] 04/27/11 1556

## 2011-04-27 NOTE — Progress Notes (Signed)
Orthopedic Tech Progress Note Patient Details:  Darryl Carroll December 21, 1927 [id number]  Type of Splint: Other (comment) (velcro wrist splint) Splint Location: left wrist Splint Interventions: Application    Nikki Dom 04/27/2011, 3:33 PM

## 2011-04-28 NOTE — ED Provider Notes (Signed)
Medical screening examination/treatment/procedure(s) were conducted as a shared visit with non-physician practitioner(s) and myself.  I personally evaluated the patient during the encounter  Patient s/p mechanical fall, witnessed by his wife although patient does not recall event. Has h/o macular degeneration but no new visual changes. L zygomatic arch fracture. To f/u with MF. At baseline per family. Plan to discharge home with outpatient f/u  Forbes Cellar, MD 04/28/11 [phone]

## 2011-05-19 ENCOUNTER — Other Ambulatory Visit: Payer: Self-pay

## 2011-05-19 MED ORDER — LISINOPRIL 40 MG PO TABS: 40.0000 mg | ORAL_TABLET | Freq: Every day | ORAL | Status: DC

## 2011-07-02 ENCOUNTER — Ambulatory Visit: Payer: Self-pay | Admitting: Pharmacist

## 2011-07-02 DIAGNOSIS — Z7901 Long term (current) use of anticoagulants: Secondary | ICD-10-CM

## 2011-07-02 DIAGNOSIS — I4891 Unspecified atrial fibrillation: Secondary | ICD-10-CM

## 2011-10-01 ENCOUNTER — Ambulatory Visit (INDEPENDENT_AMBULATORY_CARE_PROVIDER_SITE_OTHER): Payer: Medicare Other | Admitting: Cardiovascular Disease

## 2011-10-01 ENCOUNTER — Encounter: Payer: Self-pay | Admitting: Cardiovascular Disease

## 2011-10-01 VITALS — BP 130/77 | HR 66 | Ht 68.0 in | Wt 196.0 lb

## 2011-10-01 DIAGNOSIS — I4891 Unspecified atrial fibrillation: Secondary | ICD-10-CM

## 2011-10-01 DIAGNOSIS — E785 Hyperlipidemia, unspecified: Secondary | ICD-10-CM

## 2011-10-01 DIAGNOSIS — I2510 Atherosclerotic heart disease of native coronary artery without angina pectoris: Secondary | ICD-10-CM

## 2011-10-01 MED ORDER — NITROGLYCERIN 0.4 MG SL SUBL: 0.4000 mg | SUBLINGUAL_TABLET | SUBLINGUAL | Status: DC | PRN

## 2011-10-01 NOTE — Assessment & Plan Note (Signed)
Stable. Continue current therapy. No changes today.

## 2011-10-01 NOTE — Patient Instructions (Addendum)
Your physician wants you to follow-up in: 6 months.   You will receive a reminder letter in the mail two months in advance. If you don't receive a letter, please call our office to schedule the follow-up appointment.   Your physician recommends that you return for lab work in: one week (bmet, liver, cbc, lipid)

## 2011-10-01 NOTE — Assessment & Plan Note (Signed)
Rate controlled. Continue Xarelto. Will check CBC, BMET.

## 2011-10-01 NOTE — Assessment & Plan Note (Signed)
Continue statin. Check lipids and LFTS next week.

## 2011-10-01 NOTE — Progress Notes (Signed)
History of Present Illness: 78 yo male with history of CAD, atrial fibrillation here today for cardiac follow up. He has had multiple PCI procedures and has stents in all 3 vessels. His last PCI was in 2006 at which time he had overlapping stents placed in the circumflex artery. He also has a history of paroxysmal atrial fibrillation which is managed with rate control and anticoagulation with Xarelto. Lower ext arterial studies June 2011 with ABI of 1.3 bilaterally.   He has been doing well recently. He has had no chest pain, shortness of breath or palpitations.   Dr. [name] is his primary care physician. He is in the [facility].   Last Lipid Profile:  Lipid Panel     Component Value Date/Time   CHOL 125 10/09/2010 1039   TRIG 120.0 10/09/2010 1039   HDL 43.20 10/09/2010 1039   CHOLHDL 3 10/09/2010 1039   VLDL 24.0 10/09/2010 1039   LDLCALC 58 10/09/2010 1039     Past Medical History  Diagnosis Date  . DM (diabetes mellitus), type 2   . Spinal stenosis   . Glaucoma   . Gout   . Macular degeneration   . Obesity   . Osteoporosis   . CAD (coronary artery disease)     s/p multiple PCIs in all 3 vessels with DES. Last PCI 2005 with overlapping Cypher stents in CFX bifurcation lesion  . Paroxysmal atrial fibrillation   . HTN (hypertension)     not under good control  . Hyperlipidemia   . Sleep apnea   . Peptic ulcer disease     Past Surgical History  Procedure Date  . Thyroglossal duct cyst removed in jan or feb 2005   . Bilateral carpal tunnel surgery   . Cardiac catheterization     x4-5, PCIs  . Turp vaporization     Current Outpatient Prescriptions  Medication Sig Dispense Refill  . acyclovir (ZOVIRAX) 400 MG tablet Take 400 mg by mouth 2 (two) times daily.       Marland Kitchen albuterol (PROVENTIL) (5 MG/ML) 0.5% nebulizer solution Take 2.5 mg by nebulization every 4 (four) hours as needed. For shortness of breath      . alendronate (FOSAMAX) 70 MG tablet Take 70 mg by mouth every 7  (seven) days. On Wednesdays. Take with a full glass of water on an empty stomach.      Marland Kitchen allopurinol (ZYLOPRIM) 300 MG tablet Take 300 mg by mouth daily.        Marland Kitchen amLODipine (NORVASC) 10 MG tablet 1 tab daily      . atenolol (TENORMIN) 100 MG tablet Take 1 tablet (100 mg total) by mouth daily.  30 tablet  8  . calcium-vitamin D (OSCAL 500/200 D-3) 500-200 MG-UNIT per tablet Take 1 tablet by mouth 2 (two) times daily with a meal.        . Carboxymethylcellulose Sodium 0.25 % SOLN Apply 1 drop to eye 4 (four) times daily.        . citalopram (CELEXA) 40 MG tablet Take 40 mg by mouth 2 (two) times daily.       . Diphenhydramine-APAP, sleep, (TYLENOL PM EXTRA STRENGTH PO) Take 1 tablet by mouth at bedtime.      . finasteride (PROSCAR) 5 MG tablet Take 1 tablet (5 mg total) by mouth daily.  30 tablet  8  . gemfibrozil (LOPID) 600 MG tablet Take 1 tablet (600 mg total) by mouth 2 (two) times daily before a meal.  60 tablet  8  . glipiZIDE (GLUCOTROL) 10 MG tablet Take 1 tablet (10 mg total) by mouth 2 (two) times daily before a meal.  60 tablet  2  . HYDROcodone-acetaminophen (VICODIN) 5-500 MG per tablet Take 1 tablet by mouth every 4 (four) hours as needed. For pain      . indapamide (LOZOL) 2.5 MG tablet Take 2.5 mg by mouth 2 (two) times daily.       Marland Kitchen ipratropium (ATROVENT) 0.02 % nebulizer solution Take 500 mcg by nebulization daily as needed. For wheezing      . lisinopril (PRINIVIL,ZESTRIL) 40 MG tablet Take 1 tablet (40 mg total) by mouth daily.  90 tablet  3  . metFORMIN (GLUCOPHAGE) 500 MG tablet Take 500 mg by mouth 2 (two) times daily with a meal.        . Multiple Vitamin (MULITIVITAMIN WITH MINERALS) TABS Take 1 tablet by mouth daily. Preservision/lutein      . nitroGLYCERIN (NITROSTAT) 0.4 MG SL tablet Place 0.4 mg under the tongue every 5 (five) minutes as needed. For chest pain      . Rivaroxaban (XARELTO) 20 MG TABS Take 20 mg by mouth daily.  90 tablet  3  . rosuvastatin (CRESTOR) 20  MG tablet Take 10 mg by mouth at bedtime.       . traZODone (DESYREL) 100 MG tablet Take 100 mg by mouth at bedtime.         Allergies  Allergen Reactions  . Contrast Media (Iodinated Diagnostic Agents) Other (See Comments)    Reaction when stints put in    History   Social History  . Marital Status: Married    Spouse Name: N/A    Number of Children: N/A  . Years of Education: N/A   Occupational History  . Not on file.   Social History Main Topics  . Smoking status: Former Smoker    Types: Cigarettes    Quit date: 10/02/1984  . Smokeless tobacco: Not on file  . Alcohol Use: No  . Drug Use: No  . Sexually Active: Not on file   Other Topics Concern  . Not on file   Social History Narrative  . No narrative on file    Family History  Problem Relation Age of Onset  . Diabetes Mother     deceased 17  . Heart attack Father     deceased 58  . Heart attack Brother     deceased 52    Review of Systems:  As stated in the HPI and otherwise negative.   BP 130/77  Pulse 66  Ht 5\' 8"  (1.727 m)  Wt 196 lb (88.905 kg)  BMI 29.80 kg/m2  Physical Examination: General: Well developed, well nourished, NAD HEENT: OP clear, mucus membranes moist SKIN: warm, dry. No rashes. Neuro: No focal deficits Musculoskeletal: Muscle strength 5/5 all ext Psychiatric: Mood and affect normal Neck: No JVD, no carotid bruits, no thyromegaly, no lymphadenopathy. Lungs:Clear bilaterally, no wheezes, rhonci, crackles Cardiovascular: Regular rate and rhythm. No murmurs, gallops or rubs. Abdomen:Soft. Bowel sounds present. Non-tender.  Extremities: No lower extremity edema. Pulses are 2 + in the bilateral DP/PT.

## 2011-10-08 ENCOUNTER — Other Ambulatory Visit (INDEPENDENT_AMBULATORY_CARE_PROVIDER_SITE_OTHER): Payer: Medicare Other

## 2011-10-08 DIAGNOSIS — I2510 Atherosclerotic heart disease of native coronary artery without angina pectoris: Secondary | ICD-10-CM

## 2011-10-08 DIAGNOSIS — E785 Hyperlipidemia, unspecified: Secondary | ICD-10-CM

## 2011-10-08 DIAGNOSIS — I4891 Unspecified atrial fibrillation: Secondary | ICD-10-CM

## 2011-10-08 LAB — BASIC METABOLIC PANEL
BUN: 16 mg/dL (ref 6–23)
Chloride: 101 mEq/L (ref 96–112)
GFR: 86.88 mL/min (ref >60.00)
Potassium: 3.6 mEq/L (ref 3.5–5.1)
Sodium: 141 mEq/L (ref 135–145)

## 2011-10-08 LAB — CBC WITH DIFFERENTIAL/PLATELET
Basophils Absolute: 0 10*3/uL (ref 0.0–0.1)
Eosinophils Relative: 3.9 % (ref 0.0–5.0)
HCT: 38 % — ABNORMAL LOW (ref 39.0–52.0)
Lymphocytes Relative: 22.7 % (ref 12.0–46.0)
Monocytes Relative: 7.8 % (ref 3.0–12.0)
Neutrophils Relative %: 65.2 % (ref 43.0–77.0)
Platelets: 216 10*3/uL (ref 150.0–400.0)
RDW: 15.9 % — ABNORMAL HIGH (ref 11.5–14.6)
WBC: 8.7 10*3/uL (ref 4.5–10.5)

## 2011-10-08 LAB — HEPATIC FUNCTION PANEL
ALT: 11 U/L (ref 0–53)
AST: 20 U/L (ref 0–37)
Alkaline Phosphatase: 72 U/L (ref 39–117)
Bilirubin, Direct: 0.1 mg/dL (ref 0.0–0.3)
Total Bilirubin: 0.7 mg/dL (ref 0.3–1.2)

## 2011-10-08 LAB — LIPID PANEL
Cholesterol: 122 mg/dL (ref 0–200)
LDL Cholesterol: 51 mg/dL (ref 0–99)
Total CHOL/HDL Ratio: 3
VLDL: 28.6 mg/dL (ref 0.0–40.0)

## 2011-10-29 ENCOUNTER — Other Ambulatory Visit: Payer: Self-pay | Admitting: Cardiology

## 2011-10-29 MED ORDER — INDAPAMIDE 2.5 MG PO TABS: 2.5000 mg | ORAL_TABLET | Freq: Two times a day (BID) | ORAL | Status: DC

## 2011-10-29 MED ORDER — LISINOPRIL 40 MG PO TABS: 40.0000 mg | ORAL_TABLET | Freq: Every day | ORAL | Status: DC

## 2011-10-31 ENCOUNTER — Other Ambulatory Visit: Payer: Self-pay | Admitting: *Deleted

## 2011-10-31 NOTE — Telephone Encounter (Signed)
Opened in Error.

## 2011-11-27 ENCOUNTER — Ambulatory Visit (INDEPENDENT_AMBULATORY_CARE_PROVIDER_SITE_OTHER): Payer: Medicare Other | Admitting: Internal Medicine

## 2011-11-27 ENCOUNTER — Encounter: Payer: Self-pay | Admitting: Internal Medicine

## 2011-11-27 VITALS — BP 99/64 | HR 106 | Temp 97.9°F | Ht 68.0 in | Wt 196.0 lb

## 2011-11-27 DIAGNOSIS — T8459XA Infection and inflammatory reaction due to other internal joint prosthesis, initial encounter: Secondary | ICD-10-CM

## 2011-11-27 DIAGNOSIS — Z96619 Presence of unspecified artificial shoulder joint: Secondary | ICD-10-CM

## 2011-11-27 DIAGNOSIS — T8450XA Infection and inflammatory reaction due to unspecified internal joint prosthesis, initial encounter: Secondary | ICD-10-CM

## 2011-11-27 LAB — CBC WITH DIFFERENTIAL/PLATELET
Basophils Absolute: 0.1 10*3/uL (ref 0.0–0.1)
HCT: 35.4 % — ABNORMAL LOW (ref 39.0–52.0)
Lymphocytes Relative: 28 % (ref 12–46)
Lymphs Abs: 2 10*3/uL (ref 0.7–4.0)
MCV: 83.1 fL (ref 78.0–100.0)
Monocytes Absolute: 0.9 10*3/uL (ref 0.1–1.0)
Neutro Abs: 3.5 10*3/uL (ref 1.7–7.7)
Platelets: 272 10*3/uL (ref 150–400)
RBC: 4.26 MIL/uL (ref 4.22–5.81)
RDW: 16.6 % — ABNORMAL HIGH (ref 11.5–15.5)
WBC: 7 10*3/uL (ref 4.0–10.5)

## 2011-11-28 LAB — BASIC METABOLIC PANEL WITH GFR
CO2: 23 mEq/L (ref 19–32)
Chloride: 103 mEq/L (ref 96–112)
GFR, Est Non African American: 53 mL/min — ABNORMAL LOW
Potassium: 4.3 mEq/L (ref 3.5–5.3)

## 2011-11-28 LAB — SEDIMENTATION RATE: Sed Rate: 21 mm/hr — ABNORMAL HIGH (ref 0–16)

## 2011-12-06 DIAGNOSIS — T8450XA Infection and inflammatory reaction due to unspecified internal joint prosthesis, initial encounter: Secondary | ICD-10-CM | POA: Insufficient documentation

## 2011-12-06 NOTE — Progress Notes (Signed)
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   MRN: [id number]  HPI Mr Spickler is an 78yo Male with DM, pulmonary hypertension, had a severely displaced 4 part porximal humerus fracture and underwent a left shoulder reverse arthoplasty in June 2010. He subsequently had recurrent swelling localized to anterior aspect of left shoulder along surgical incision. Underwent debridement in office on 8/12 ,cutlures negative. Placed on doxycycline and cipro in the meantime. No improvement in drainage, but no swelling nor erythema. No fever,chills, nightsweats. Unclear if patient was already on antibiotics at the time of the office I X D to interfere with culture results. His wife continues to do local wound care BID, with alcohol/peroxide. Inflammatory markers noted to be sed rate of 19 and crp of 0.6 on 10/16/2011. Gram stain showed abdundant wbc and polymicrobial, non predominant on growth. Gram stain showed rare gpc in pairs and rare gpr  Current Outpatient Prescriptions on File Prior to Visit  Medication Sig Dispense Refill  . acyclovir (ZOVIRAX) 400 MG tablet Take 400 mg by mouth 2 (two) times daily.       Marland Kitchen albuterol (PROVENTIL) (5 MG/ML) 0.5% nebulizer solution Take 2.5 mg by nebulization every 4 (four) hours as needed. For shortness of breath      . alendronate (FOSAMAX) 70 MG tablet Take 70 mg by mouth every 7 (seven) days. On Wednesdays. Take with a full glass of water on an empty stomach.      Marland Kitchen allopurinol (ZYLOPRIM) 300 MG tablet Take 300 mg by mouth daily.        Marland Kitchen amLODipine (NORVASC) 10 MG tablet 1 tab daily      . atenolol (TENORMIN) 100 MG tablet Take 1 tablet (100 mg total) by mouth daily.  30 tablet  8  . calcium-vitamin D (OSCAL 500/200 D-3) 500-200 MG-UNIT per tablet Take 1 tablet by mouth 2 (two) times daily with a meal.        .  Carboxymethylcellulose Sodium 0.25 % SOLN Apply 1 drop to eye 4 (four) times daily.        . citalopram (CELEXA) 40 MG tablet Take 40 mg by mouth 2 (two) times daily.       . Diphenhydramine-APAP, sleep, (TYLENOL PM EXTRA STRENGTH PO) Take 1 tablet by mouth at bedtime.      . finasteride (PROSCAR) 5 MG tablet Take 1 tablet (5 mg total) by mouth daily.  30 tablet  8  . gemfibrozil (LOPID) 600 MG tablet Take 1 tablet (600 mg total) by mouth 2 (two) times daily before a meal.  60 tablet  8  . glipiZIDE (GLUCOTROL) 10 MG tablet Take 1 tablet (10 mg total) by mouth 2 (two) times daily before a meal.  60 tablet  2  . indapamide (LOZOL) 2.5 MG tablet Take 1 tablet (2.5 mg total) by mouth 2 (two) times daily.  60 tablet  4  . ipratropium (ATROVENT) 0.02 % nebulizer solution Take 500 mcg by nebulization daily as needed. For wheezing      . lisinopril (PRINIVIL,ZESTRIL) 40 MG tablet Take 1 tablet (40 mg total) by mouth daily.  90 tablet  3  . metFORMIN (GLUCOPHAGE) 500 MG tablet Take 500 mg by mouth 2 (two) times daily with a meal.        . Multiple Vitamin (MULITIVITAMIN WITH MINERALS) TABS  Take 1 tablet by mouth daily. Preservision/lutein      . nitroGLYCERIN (NITROSTAT) 0.4 MG SL tablet Place 1 tablet (0.4 mg total) under the tongue every 5 (five) minutes as needed. For chest pain  25 tablet  11  . omeprazole (PRILOSEC) 20 MG capsule Take 20 mg by mouth daily.      . Rivaroxaban (XARELTO) 20 MG TABS Take 20 mg by mouth daily.  90 tablet  3  . rosuvastatin (CRESTOR) 20 MG tablet Take 10 mg by mouth at bedtime.       . traZODone (DESYREL) 100 MG tablet Take 100 mg by mouth at bedtime.       Marland Kitchen HYDROcodone-acetaminophen (VICODIN) 5-500 MG per tablet Take 1 tablet by mouth every 4 (four) hours as needed. For pain       Active Ambulatory Problems    Diagnosis Date Noted  . DIABETES MELLITUS, TYPE II 08/14/2008  . HYPERLIPIDEMIA 08/14/2008  . GOUT 08/14/2008  . OBESITY 08/14/2008  . MACULAR DEGENERATION  08/14/2008  . GLAUCOMA 08/14/2008  . HYPERTENSION 08/14/2008  . CAD 08/14/2008  . FIBRILLATION, ATRIAL 08/14/2008  . PERIPHERAL VASCULAR INSUFFICIENCY,  LEGS, BILATERAL 08/15/2009  . PEPTIC ULCER DISEASE 08/14/2008  . SPINAL STENOSIS 08/14/2008  . OSTEOPOROSIS 08/14/2008  . SLEEP APNEA 08/14/2008  . Encounter for long-term (current) use of anticoagulants 06/06/2010   Resolved Ambulatory Problems    Diagnosis Date Noted  . No Resolved Ambulatory Problems   Past Medical History  Diagnosis Date  . DM (diabetes mellitus), type 2   . Spinal stenosis   . Glaucoma   . Gout   . Macular degeneration   . Obesity   . Osteoporosis   . CAD (coronary artery disease)   . Paroxysmal atrial fibrillation   . HTN (hypertension)   . Hyperlipidemia   . Sleep apnea   . Peptic ulcer disease    History  Substance Use Topics  . Smoking status: Former Smoker    Types: Cigarettes    Quit date: 10/02/1984  . Smokeless tobacco: Not on file  . Alcohol Use: No  - he and his wife life at [facility]. They have a small chihuahua as a companion. Son lives in [place]. He is retired. Formally wored for [employer], became a teacher/principal prior to retirement in 1985. No smoking. He is also a Cytogeneticist.  family history includes Diabetes in his mother and Heart attack in his brother and father.  Review of Systems  Constitutional: Negative for fever, chills, diaphoresis, activity change, appetite change, fatigue and unexpected weight change.  HENT: Negative for congestion, sore throat, rhinorrhea, sneezing, trouble swallowing and sinus pressure.  Eyes: Negative for photophobia and visual disturbance.  Respiratory: Negative for cough, chest tightness, shortness of breath, wheezing and stridor.  Cardiovascular: Negative for chest pain, palpitations and leg swelling.  Gastrointestinal: Negative for nausea, vomiting, abdominal pain, diarrhea, constipation, blood in stool, abdominal  distention and anal bleeding.  Genitourinary: Negative for dysuria, hematuria, flank pain and difficulty urinating.  Musculoskeletal: left shoulder drainage per hpi Skin: Negative for color change, pallor, rash and wound.  Neurological: Negative for dizziness, tremors, weakness and light-headedness.  Hematological: Negative for adenopathy. Does not bruise/bleed easily.  Psychiatric/Behavioral: Negative for behavioral problems, confusion, sleep disturbance, dysphoric mood, decreased concentration and agitation.       Objective:   Physical Exam  BP 99/64  Pulse 106  Temp 97.9 F (36.6 C) (Oral)  Ht 5\' 8"  (1.727 m)  Wt 196 lb (  88.905 kg)  BMI 29.80 kg/m2 Physical Exam  Constitutional: He is oriented to person, place, and time. He appears well-developed and well-nourished. No distress.  HENT:  Mouth/Throat: Oropharynx is clear and moist. No oropharyngeal exudate.  Cardiovascular: Normal rate, regular rhythm and normal heart sounds. Exam reveals no gallop and no friction rub.  No murmur heard.  Pulmonary/Chest: Effort normal and breath sounds normal. No respiratory distress. He has no wheezes.  Abdominal: Soft. Bowel sounds are normal. He exhibits no distension. There is no tenderness.  Lymphadenopathy:  He has no cervical adenopathy.  Neurological: He is alert and oriented to person, place, and time.  Skin: Skin is warm and dry. No rash noted. No erythema.  Psychiatric: He has a normal mood and affect. His behavior is normal.   Lab Results  Component Value Date   CRP 1.2* 11/27/2011   Lab Results  Component Value Date   ESRSEDRATE 21* 11/27/2011          Assessment & Plan:  Presumed chronic prosthetic joint infection = cultures haven ot been able to identify pathogen. Currently on bactrim but still has ongoing serous drainage.patient is too high risk for any revision/ i x d. We will discuss with Dr. Rennis Chris if he is able to resample to send fluid to culture to see if any  pathogen, esp. P.acnes that can be isolated. Will ask patient to stop taking bactrim in order to not suppress culture.  If unable to re-culture, we will do empiric trial of pen V 500mg  TID to see if that works on reducing drainage. If not change or worsening out put, may need to change back to bactrim.  Due to him not being a surgical candidate, anticipate life long suppression. We will see how he responds to penicillin to decide the agent we will give him for chronic suppression.   Will check labs: addendum: Slightly elevated from 1 month prior.  rtc in 4 wks

## 2011-12-24 ENCOUNTER — Other Ambulatory Visit: Payer: Self-pay | Admitting: Cardiovascular Disease

## 2011-12-30 ENCOUNTER — Encounter: Payer: Self-pay | Admitting: Internal Medicine

## 2011-12-30 ENCOUNTER — Ambulatory Visit (INDEPENDENT_AMBULATORY_CARE_PROVIDER_SITE_OTHER): Payer: Medicare Other | Admitting: Internal Medicine

## 2011-12-30 VITALS — BP 123/76 | HR 78 | Temp 97.4°F | Ht 68.0 in | Wt 194.0 lb

## 2011-12-30 DIAGNOSIS — T8450XA Infection and inflammatory reaction due to unspecified internal joint prosthesis, initial encounter: Secondary | ICD-10-CM

## 2011-12-30 LAB — SEDIMENTATION RATE: Sed Rate: 27 mm/hr — ABNORMAL HIGH (ref 0–16)

## 2011-12-30 NOTE — Progress Notes (Signed)
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   MRN: [id number]  HPI Darryl Carroll is an 78yo Male with DM, pulmonary hypertension, had a severely displaced 4 part porximal humerus fracture and underwent a left shoulder reverse arthoplasty in June 2010. He subsequently had recurrent swelling localized to anterior aspect of left shoulder along surgical incision. Underwent debridement in office on 8/12 ,cutlures negative. He has been on doxycycline, cipro and bactrim. No improvement in drainage, but no swelling nor erythema. No fever,chills, nightsweats. Unclear if patient was already on antibiotics at the time of the office I X D to interfere with culture results. His wife continues to do local wound care BID, with alcohol/peroxide. She mentions that she has been trying other herbal remedies, including figs on the wound, due to a biblical reference.  Inflammatory markers noted to be sed rate of 21 and crp of 1.2 in late Sept 2013. Gram stain showed abdundant wbc and polymicrobial, non predominant on growth. Gram stain showed rare gpc in pairs and rare gpr.   There was concern that patient may have propionibacterium acnes, although not isolated in culture, thus was switched to penicillin in the last month. His drainage has not changed since changing to penicillin.  Received their flu shot at the nursing home.  Current Outpatient Prescriptions on File Prior to Visit  Medication Sig Dispense Refill  . acyclovir (ZOVIRAX) 400 MG tablet Take 400 mg by mouth 2 (two) times daily.       Marland Kitchen albuterol (PROVENTIL) (5 MG/ML) 0.5% nebulizer solution Take 2.5 mg by nebulization every 4 (four) hours as needed. For shortness of breath      . alendronate (FOSAMAX) 70 MG tablet Take 70 mg by mouth every 7 (seven) days. On Wednesdays. Take with a full glass of water on an empty stomach.      Marland Kitchen allopurinol  (ZYLOPRIM) 300 MG tablet Take 300 mg by mouth daily.        Marland Kitchen amLODipine (NORVASC) 10 MG tablet TAKE 1 TABLET BY MOUTH ONCE DAILY  90 tablet  3  . atenolol (TENORMIN) 100 MG tablet Take 1 tablet (100 mg total) by mouth daily.  30 tablet  8  . calcium-vitamin D (OSCAL 500/200 D-3) 500-200 MG-UNIT per tablet Take 1 tablet by mouth 2 (two) times daily with a meal.        . Carboxymethylcellulose Sodium 0.25 % SOLN Apply 1 drop to eye 4 (four) times daily.        . citalopram (CELEXA) 40 MG tablet Take 40 mg by mouth 2 (two) times daily.       . Diphenhydramine-APAP, sleep, (TYLENOL PM EXTRA STRENGTH PO) Take 1 tablet by mouth at bedtime.      . ferrous sulfate 325 (65 FE) MG tablet Take 325 mg by mouth daily with breakfast.      . finasteride (PROSCAR) 5 MG tablet Take 1 tablet (5 mg total) by mouth daily.  30 tablet  8  . gemfibrozil (LOPID) 600 MG tablet Take 1 tablet (600 mg total) by mouth 2 (two) times daily before a meal.  60 tablet  8  . glipiZIDE (GLUCOTROL) 10 MG tablet Take 1 tablet (10 mg total) by mouth 2 (two) times daily before a meal.  60 tablet  2  . HYDROcodone-acetaminophen (VICODIN) 5-500 MG per tablet Take 1 tablet by mouth every 4 (  four) hours as needed. For pain      . indapamide (LOZOL) 2.5 MG tablet Take 1 tablet (2.5 mg total) by mouth 2 (two) times daily.  60 tablet  4  . ipratropium (ATROVENT) 0.02 % nebulizer solution Take 500 mcg by nebulization daily as needed. For wheezing      . lisinopril (PRINIVIL,ZESTRIL) 40 MG tablet Take 1 tablet (40 mg total) by mouth daily.  90 tablet  3  . metFORMIN (GLUCOPHAGE) 500 MG tablet Take 500 mg by mouth 2 (two) times daily with a meal.        . Multiple Vitamin (MULITIVITAMIN WITH MINERALS) TABS Take 1 tablet by mouth daily. Preservision/lutein      . nitroGLYCERIN (NITROSTAT) 0.4 MG SL tablet Place 1 tablet (0.4 mg total) under the tongue every 5 (five) minutes as needed. For chest pain  25 tablet  11  . omeprazole (PRILOSEC) 20 MG  capsule Take 20 mg by mouth daily.      . Rivaroxaban (XARELTO) 20 MG TABS Take 20 mg by mouth daily.  90 tablet  3  . rosuvastatin (CRESTOR) 20 MG tablet Take 10 mg by mouth at bedtime.       . sulfamethoxazole-trimethoprim (BACTRIM DS) 800-160 MG per tablet Take 1 tablet by mouth 2 (two) times daily.      . traZODone (DESYREL) 100 MG tablet Take 100 mg by mouth at bedtime.        Active Ambulatory Problems    Diagnosis Date Noted  . DIABETES MELLITUS, TYPE II 08/14/2008  . HYPERLIPIDEMIA 08/14/2008  . GOUT 08/14/2008  . OBESITY 08/14/2008  . MACULAR DEGENERATION 08/14/2008  . GLAUCOMA 08/14/2008  . HYPERTENSION 08/14/2008  . CAD 08/14/2008  . FIBRILLATION, ATRIAL 08/14/2008  . PERIPHERAL VASCULAR INSUFFICIENCY,  LEGS, BILATERAL 08/15/2009  . PEPTIC ULCER DISEASE 08/14/2008  . SPINAL STENOSIS 08/14/2008  . OSTEOPOROSIS 08/14/2008  . SLEEP APNEA 08/14/2008  . Encounter for long-term (current) use of anticoagulants 06/06/2010  . Prosthetic joint infection 12/06/2011   Resolved Ambulatory Problems    Diagnosis Date Noted  . No Resolved Ambulatory Problems   Past Medical History  Diagnosis Date  . DM (diabetes mellitus), type 2   . Spinal stenosis   . Glaucoma(365)   . Gout   . Macular degeneration   . Obesity   . Osteoporosis   . CAD (coronary artery disease)   . Paroxysmal atrial fibrillation   . HTN (hypertension)   . Hyperlipidemia   . Sleep apnea   . Peptic ulcer disease      Review of Systems No fever, chills, rash to left shoulder, no worsening exudative process to left shoulder. No diarrhea.    Objective:   Physical Exam  BP 123/76  Pulse 78  Temp 97.4 F (36.3 C) (Oral)  Ht 5\' 8"  (1.727 m)  Wt 194 lb (87.998 kg)  BMI 29.50 kg/m2 Physical Exam  Constitutional: He is oriented to person, place, and time. He appears well-developed and well-nourished. No distress.  HENT:  Mouth/Throat: Oropharynx is clear and moist. No oropharyngeal exudate.    Cardiovascular: Normal rate, regular rhythm and normal heart sounds. Exam reveals no gallop and no friction rub.  No murmur heard.  Pulmonary/Chest: Effort normal and breath sounds normal. No respiratory distress. He has no wheezes.  Abdominal: Soft. Bowel sounds are normal. He exhibits no distension. There is no tenderness.  Lymphadenopathy:  no cervical adenopathy.  Neurological: He is alert and oriented to person, place, and time.  Skin: Skin is warm and dry. No rash noted. No erythema.  ZOX:WRUE shoulder: unchanged still small amount tissue extruding from 1.5 cm opening, no significant warmth. Clear yellowish drainage from wound.  Lab Results  Component Value Date   CRP <0.5 12/30/2011   Lab Results  Component Value Date   ESRSEDRATE 27* 12/30/2011       Assessment & Plan:  Prosthetic joint infection = culture negative, and patient has too many co-morbidities to undergo any invasive sampling, or I X D. Today, we will check sed rate and crp. Continue the penicillin TID for suppression. We are presumably treating for P.acnes, although, never isolated in culture. Thus, far, does not appear to be worse from the transition to penicillin. We are hoping to find the right empiric regimen for him for life long suppression  For local wound care= I have asked Darryl Carroll to avoid herbal remedies and continue with wet to dry dressing.  Addendum: CRP down from last month and sed rate marginally elevated. Will continue with penicillin for another 4 wks, re-check ESR and CRP at that time. If worsened, will change back to bactrim.  rtc in 3 month but repeat lab work in 4 wk

## 2011-12-31 LAB — C-REACTIVE PROTEIN: CRP: <0.5 mg/dL (ref <0.60)

## 2012-04-08 ENCOUNTER — Encounter: Payer: Self-pay | Admitting: Cardiovascular Disease

## 2012-04-08 ENCOUNTER — Ambulatory Visit (INDEPENDENT_AMBULATORY_CARE_PROVIDER_SITE_OTHER): Payer: Medicare Other | Admitting: Cardiovascular Disease

## 2012-04-08 VITALS — BP 142/76 | HR 84 | Ht 68.0 in | Wt 195.0 lb

## 2012-04-08 DIAGNOSIS — I4891 Unspecified atrial fibrillation: Secondary | ICD-10-CM

## 2012-04-08 DIAGNOSIS — I2510 Atherosclerotic heart disease of native coronary artery without angina pectoris: Secondary | ICD-10-CM

## 2012-04-08 DIAGNOSIS — E785 Hyperlipidemia, unspecified: Secondary | ICD-10-CM

## 2012-04-08 NOTE — Progress Notes (Signed)
History of Present Illness: 79 yo male with history of CAD, atrial fibrillation here today for cardiac follow up. He has had multiple PCI procedures and has stents in all 3 vessels. His last PCI was in 2006 at which time he had overlapping stents placed in the circumflex artery. He also has a history of paroxysmal atrial fibrillation which is managed with rate control and anticoagulation with Xarelto. Lower ext arterial studies June 2011 with ABI of 1.3 bilaterally.   He is here today for follow up. He has been doing well recently. He has had no chest pain, shortness of breath or palpitations.   Primary Care Physician: Dr. [name]  Last Lipid Profile:Lipid Panel     Component Value Date/Time   CHOL 122 10/08/2011 0849   TRIG 143.0 10/08/2011 0849   HDL 42.90 10/08/2011 0849   CHOLHDL 3 10/08/2011 0849   VLDL 28.6 10/08/2011 0849   LDLCALC 51 10/08/2011 0849     Past Medical History  Diagnosis Date  . DM (diabetes mellitus), type 2   . Spinal stenosis   . Glaucoma(365)   . Gout   . Macular degeneration   . Obesity   . Osteoporosis   . CAD (coronary artery disease)     s/p multiple PCIs in all 3 vessels with DES. Last PCI 2005 with overlapping Cypher stents in CFX bifurcation lesion  . Paroxysmal atrial fibrillation   . HTN (hypertension)     not under good control  . Hyperlipidemia   . Sleep apnea   . Peptic ulcer disease     Past Surgical History  Procedure Date  . Thyroglossal duct cyst removed in jan or feb 2005   . Bilateral carpal tunnel surgery   . Cardiac catheterization     x4-5, PCIs  . Turp vaporization     Current Outpatient Prescriptions  Medication Sig Dispense Refill  . acyclovir (ZOVIRAX) 400 MG tablet Take 400 mg by mouth 2 (two) times daily.       Marland Kitchen albuterol (PROVENTIL) (5 MG/ML) 0.5% nebulizer solution Take 2.5 mg by nebulization every 4 (four) hours as needed. For shortness of breath      . alendronate (FOSAMAX) 70 MG tablet Take 70 mg by mouth  every 7 (seven) days. On Wednesdays. Take with a full glass of water on an empty stomach.      Marland Kitchen allopurinol (ZYLOPRIM) 300 MG tablet Take 300 mg by mouth daily.        Marland Kitchen amLODipine (NORVASC) 10 MG tablet TAKE 1 TABLET BY MOUTH ONCE DAILY  90 tablet  3  . atenolol (TENORMIN) 100 MG tablet Take 1 tablet (100 mg total) by mouth daily.  30 tablet  8  . calcium-vitamin D (OSCAL 500/200 D-3) 500-200 MG-UNIT per tablet Take 1 tablet by mouth 2 (two) times daily with a meal.        . Carboxymethylcellulose Sodium 0.25 % SOLN Apply 1 drop to eye 4 (four) times daily.        . citalopram (CELEXA) 40 MG tablet Take 40 mg by mouth 2 (two) times daily.       . Diphenhydramine-APAP, sleep, (TYLENOL PM EXTRA STRENGTH PO) Take 1 tablet by mouth at bedtime.      . ferrous sulfate 325 (65 FE) MG tablet Take 325 mg by mouth daily with breakfast.      . finasteride (PROSCAR) 5 MG tablet Take 1 tablet (5 mg total) by mouth daily.  30 tablet  8  .  gemfibrozil (LOPID) 600 MG tablet Take 1 tablet (600 mg total) by mouth 2 (two) times daily before a meal.  60 tablet  8  . glipiZIDE (GLUCOTROL) 10 MG tablet Take 1 tablet (10 mg total) by mouth 2 (two) times daily before a meal.  60 tablet  2  . HYDROcodone-acetaminophen (VICODIN) 5-500 MG per tablet Take 1 tablet by mouth every 4 (four) hours as needed. For pain      . indapamide (LOZOL) 2.5 MG tablet Take 1 tablet (2.5 mg total) by mouth 2 (two) times daily.  60 tablet  4  . ipratropium (ATROVENT) 0.02 % nebulizer solution Take 500 mcg by nebulization daily as needed. For wheezing      . lisinopril (PRINIVIL,ZESTRIL) 40 MG tablet Take 1 tablet (40 mg total) by mouth daily.  90 tablet  3  . metFORMIN (GLUCOPHAGE) 500 MG tablet Take 500 mg by mouth 2 (two) times daily with a meal.        . Multiple Vitamin (MULITIVITAMIN WITH MINERALS) TABS Take 1 tablet by mouth daily. Preservision/lutein      . nitroGLYCERIN (NITROSTAT) 0.4 MG SL tablet Place 1 tablet (0.4 mg total) under  the tongue every 5 (five) minutes as needed. For chest pain  25 tablet  11  . omeprazole (PRILOSEC) 20 MG capsule Take 20 mg by mouth daily.      . penicillin v potassium (VEETID) 500 MG tablet Take 500 mg by mouth 3 (three) times daily.      . Rivaroxaban (XARELTO) 20 MG TABS Take 20 mg by mouth daily.  90 tablet  3  . rosuvastatin (CRESTOR) 20 MG tablet Take 10 mg by mouth at bedtime.       . sulfamethoxazole-trimethoprim (BACTRIM DS) 800-160 MG per tablet Take 1 tablet by mouth 2 (two) times daily.      . traZODone (DESYREL) 100 MG tablet Take 100 mg by mouth at bedtime.         Allergies  Allergen Reactions  . Contrast Media (Iodinated Diagnostic Agents) Other (See Comments)    Reaction when stints put in    History   Social History  . Marital Status: Married    Spouse Name: N/A    Number of Children: N/A  . Years of Education: N/A   Occupational History  . Not on file.   Social History Main Topics  . Smoking status: Former Smoker    Types: Cigarettes    Quit date: 10/02/1984  . Smokeless tobacco: Never Used  . Alcohol Use: No  . Drug Use: No  . Sexually Active: Not on file   Other Topics Concern  . Not on file   Social History Narrative  . No narrative on file    Family History  Problem Relation Age of Onset  . Diabetes Mother     deceased 28  . Heart attack Father     deceased 40  . Heart attack Brother     deceased 67    Review of Systems:  As stated in the HPI and otherwise negative.   BP 142/76  Pulse 84  Ht 5\' 8"  (1.727 m)  Wt 195 lb (88.451 kg)  BMI 29.65 kg/m2  Physical Examination: General: Well developed, well nourished, NAD HEENT: OP clear, mucus membranes moist SKIN: warm, dry. No rashes. Neuro: No focal deficits Musculoskeletal: Muscle strength 5/5 all ext Psychiatric: Mood and affect normal Neck: No JVD, no carotid bruits, no thyromegaly, no lymphadenopathy. Lungs:Clear bilaterally, no wheezes,  rhonci, crackles Cardiovascular:  Regular rate and rhythm. No murmurs, gallops or rubs. Abdomen:Soft. Bowel sounds present. Non-tender.  Extremities: No lower extremity edema. Pulses are 2 + in the bilateral DP/PT.  EKG: Atrial fibrillation, rate 80 bpm.   Assessment and Plan:   1. CAD: Stable. Continue current therapy. No changes today.   2. Atrial fibrillation: Rate controlled. Continue Xarelto. If he has more falls, will d/c.   3. HYPERLIPIDEMIA: Lipids well controlled. Continue statin.

## 2012-04-08 NOTE — Patient Instructions (Addendum)
Your physician wants you to follow-up in:  6 months. You will receive a reminder letter in the mail two months in advance. If you don't receive a letter, please call our office to schedule the follow-up appointment.   

## 2012-05-03 ENCOUNTER — Other Ambulatory Visit: Payer: Self-pay | Admitting: Cardiovascular Disease

## 2012-05-04 ENCOUNTER — Ambulatory Visit (INDEPENDENT_AMBULATORY_CARE_PROVIDER_SITE_OTHER): Payer: Medicare Other | Admitting: Internal Medicine

## 2012-05-04 ENCOUNTER — Encounter: Payer: Self-pay | Admitting: Internal Medicine

## 2012-05-04 VITALS — BP 115/69 | HR 73 | Temp 97.7°F | Wt 193.0 lb

## 2012-05-04 MED ORDER — PENICILLIN V POTASSIUM 500 MG PO TABS: 500.0000 mg | ORAL_TABLET | Freq: Three times a day (TID) | ORAL | Status: DC

## 2012-05-04 NOTE — Progress Notes (Signed)
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   MRN: [id number]  HPI Mr Plemons is an 79yo Male with DM, pulmonary hypertension, had a severely displaced 4 part porximal humerus fracture and underwent a left shoulder reverse arthoplasty in June 2010. He subsequently had recurrent swelling localized to anterior aspect of left shoulder along surgical incision. Underwent debridement in office on 8/12 ,cutlures negative. He has been on doxycycline, cipro and bactrim. No improvement in drainage, but no swelling nor erythema. No fever,chills, nightsweats. Unclear if patient was already on antibiotics at the time of the office I X D to interfere with culture results. Gram stain showed abdundant wbc and polymicrobial, non predominant on growth. Gram stain showed rare gpc in pairs and rare gpr.   There was concern that patient may have propionibacterium acnes, although not isolated in culture, thus was switched to penicillin in September 2013 . His drainage has stopped and no longer requires dressing changes as of November 2013.  He denies worsening pain to shoulder, fevers, chills, nightsweats, swelling of joint. He occassionally  Feels itchy on leftside of abdomen. He reports sustaining ground level fall 2 wks ago, "knees went out" slide to the floor without injury  Current Outpatient Prescriptions on File Prior to Visit  Medication Sig Dispense Refill  . acyclovir (ZOVIRAX) 400 MG tablet Take 400 mg by mouth 2 (two) times daily.       Marland Kitchen albuterol (PROVENTIL) (5 MG/ML) 0.5% nebulizer solution Take 2.5 mg by nebulization every 4 (four) hours as needed. For shortness of breath      . alendronate (FOSAMAX) 70 MG tablet Take 70 mg by mouth every 7 (seven) days. On Wednesdays. Take with a full glass of water on an empty stomach.      Marland Kitchen amLODipine (NORVASC) 10 MG tablet TAKE 1  TABLET BY MOUTH ONCE DAILY  90 tablet  3  . atenolol (TENORMIN) 100 MG tablet Take 1 tablet (100 mg total) by mouth daily.  30 tablet  8  . calcium-vitamin D (OSCAL 500/200 D-3) 500-200 MG-UNIT per tablet Take 1 tablet by mouth 2 (two) times daily with a meal.        . Carboxymethylcellulose Sodium 0.25 % SOLN Apply 1 drop to eye 4 (four) times daily.        . citalopram (CELEXA) 40 MG tablet Take 40 mg by mouth 2 (two) times daily.       . ferrous sulfate 325 (65 FE) MG tablet Take 325 mg by mouth daily with breakfast.      . finasteride (PROSCAR) 5 MG tablet Take 1 tablet (5 mg total) by mouth daily.  30 tablet  8  . gemfibrozil (LOPID) 600 MG tablet Take 1 tablet (600 mg total) by mouth 2 (two) times daily before a meal.  60 tablet  8  . glipiZIDE (GLUCOTROL) 10 MG tablet Take 1 tablet (10 mg total) by mouth 2 (two) times daily before a meal.  60 tablet  2  . indapamide (LOZOL) 2.5 MG tablet Take 1 tablet (2.5 mg total) by mouth 2 (two) times daily.  60 tablet  4  . ipratropium (ATROVENT) 0.02 % nebulizer solution Take 500 mcg by nebulization daily as needed. For wheezing      . lisinopril (PRINIVIL,ZESTRIL) 40 MG tablet Take 1 tablet (40 mg total) by mouth daily.  90  tablet  3  . metFORMIN (GLUCOPHAGE) 500 MG tablet Take 500 mg by mouth 2 (two) times daily with a meal.        . Multiple Vitamin (MULITIVITAMIN WITH MINERALS) TABS Take 1 tablet by mouth daily. Preservision/lutein      . nitroGLYCERIN (NITROSTAT) 0.4 MG SL tablet Place 1 tablet (0.4 mg total) under the tongue every 5 (five) minutes as needed. For chest pain  25 tablet  11  . omeprazole (PRILOSEC) 20 MG capsule Take 20 mg by mouth daily.      . Rivaroxaban (XARELTO) 20 MG TABS Take 20 mg by mouth daily.  90 tablet  3  . sertraline (ZOLOFT) 100 MG tablet Take 100 mg by mouth daily.       No current facility-administered medications on file prior to visit.    Social hx: lives in retirement community with his wife and small  chihu[place]ua, "lady", wife very involved in church. Have a son that lives in [place], visiting this weekend   Review of Systems     Objective:   Physical Exam BP 115/69  Pulse 73  Temp(Src) 97.7 F (36.5 C) (Oral)  Wt 193 lb (87.544 kg)  BMI 29.35 kg/m2 Physical Exam  Constitutional: He is oriented to person, place, and time. He appears well-developed and well-nourished. No distress.  HENT:  Mouth/Throat: Oropharynx is clear and moist. No oropharyngeal exudate.  Cardiovascular: Normal rate, regular rhythm and normal heart sounds. Exam reveals no gallop and no friction rub. No murmur heard.  Pulmonary/Chest: Effort normal and breath sounds normal. No respiratory distress. He has no wheezes.  Abdominal: Soft. Bowel sounds are normal. He exhibits no distension. There is no tenderness.  Skin: left shoulder some erythema, not blanching no longer draining   Labs: Lab Results  Component Value Date   CRP <0.5 12/30/2011   Lab Results  Component Value Date   ESRSEDRATE 27* 12/30/2011        Assessment & Plan:  Prosthetic joint infection = will continue with penicillin for suppression, check crp and sed rate today  Pruritic rash, resolved = can continue to use topical agents, like moisturizing lotions  rtc 53month

## 2012-05-05 LAB — C-REACTIVE PROTEIN: CRP: <0.5 mg/dL (ref <0.60)

## 2012-05-13 ENCOUNTER — Telehealth: Payer: Self-pay | Admitting: Cardiovascular Disease

## 2012-05-13 NOTE — Telephone Encounter (Signed)
Walk In pt Form " pt Dropped Off Letter For [facility]" Sent to  Pat/[facility]Alh[facility] 05/13/12/KM

## 2012-08-05 ENCOUNTER — Telehealth: Payer: Self-pay | Admitting: *Deleted

## 2012-08-05 NOTE — Telephone Encounter (Signed)
Received request for prior auth for Xarelto. I spoke with rep at [facility] and was given approval for Xarelto through 08/05/13. Case ID # [id number]. Message left at [facility] pharmacy with this information.

## 2012-10-05 ENCOUNTER — Ambulatory Visit: Payer: Medicare Other | Admitting: Cardiovascular Disease

## 2012-10-21 ENCOUNTER — Other Ambulatory Visit: Payer: Self-pay | Admitting: Cardiovascular Disease

## 2012-11-24 ENCOUNTER — Other Ambulatory Visit: Payer: Self-pay | Admitting: Internal Medicine

## 2012-11-29 ENCOUNTER — Telehealth: Payer: Self-pay | Admitting: *Deleted

## 2012-11-29 DIAGNOSIS — T8459XS Infection and inflammatory reaction due to other internal joint prosthesis, sequela: Secondary | ICD-10-CM

## 2012-11-29 MED ORDER — PENICILLIN V POTASSIUM 500 MG PO TABS: 500.0000 mg | ORAL_TABLET | Freq: Three times a day (TID) | ORAL | Status: DC

## 2012-11-29 NOTE — Telephone Encounter (Signed)
RN spoke with wife.  Made 6 month f/u appt w/ Dr. Drue Second.  Refilled antibiotic rx for 30 days.

## 2012-12-07 ENCOUNTER — Ambulatory Visit: Payer: Medicare Other | Admitting: Internal Medicine

## 2012-12-08 ENCOUNTER — Encounter: Payer: Self-pay | Admitting: Cardiovascular Disease

## 2012-12-08 ENCOUNTER — Ambulatory Visit (INDEPENDENT_AMBULATORY_CARE_PROVIDER_SITE_OTHER): Payer: Medicare Other | Admitting: Cardiovascular Disease

## 2012-12-08 VITALS — BP 120/72 | HR 88 | Ht 68.0 in | Wt 188.0 lb

## 2012-12-08 DIAGNOSIS — I4891 Unspecified atrial fibrillation: Secondary | ICD-10-CM

## 2012-12-08 DIAGNOSIS — I2510 Atherosclerotic heart disease of native coronary artery without angina pectoris: Secondary | ICD-10-CM

## 2012-12-08 MED ORDER — LISINOPRIL 40 MG PO TABS: 40.0000 mg | ORAL_TABLET | Freq: Every day | ORAL | Status: DC

## 2012-12-08 MED ORDER — ROSUVASTATIN CALCIUM 10 MG PO TABS: 10.0000 mg | ORAL_TABLET | Freq: Every day | ORAL | Status: DC

## 2012-12-08 MED ORDER — AMLODIPINE BESYLATE 10 MG PO TABS: 10.0000 mg | ORAL_TABLET | Freq: Every day | ORAL | Status: DC

## 2012-12-08 MED ORDER — ATENOLOL 100 MG PO TABS: 100.0000 mg | ORAL_TABLET | Freq: Every day | ORAL | Status: DC

## 2012-12-08 MED ORDER — RIVAROXABAN 20 MG PO TABS: 20.0000 mg | ORAL_TABLET | Freq: Every day | ORAL | Status: DC

## 2012-12-08 NOTE — Patient Instructions (Addendum)
Your physician wants you to follow-up in:  6 months. You will receive a reminder letter in the mail two months in advance. If you don't receive a letter, please call our office to schedule the follow-up appointment.   

## 2012-12-08 NOTE — Progress Notes (Signed)
History of Present Illness: 79 yo male with history of CAD, atrial fibrillation here today for cardiac follow up. He has had multiple PCI procedures and has stents in all 3 vessels. His last PCI was in 2006 at which time he had overlapping stents placed in the circumflex artery. He also has a history of paroxysmal atrial fibrillation which is managed with rate control and anticoagulation with Xarelto. Lower ext arterial studies June 2011 with ABI of 1.3 bilaterally.   He is here today for follow up. He has been doing well recently. He has had no chest pain, shortness of breath or palpitations. No bleeding issues.   Primary Care Physician: Dr. [name]  Last Lipid Profile:Lipid Panel     Component Value Date/Time   CHOL 122 10/08/2011 0849   TRIG 143.0 10/08/2011 0849   HDL 42.90 10/08/2011 0849   CHOLHDL 3 10/08/2011 0849   VLDL 28.6 10/08/2011 0849   LDLCALC 51 10/08/2011 0849     Past Medical History  Diagnosis Date  . DM (diabetes mellitus), type 2   . Spinal stenosis   . Glaucoma   . Gout   . Macular degeneration   . Obesity   . Osteoporosis   . CAD (coronary artery disease)     s/p multiple PCIs in all 3 vessels with DES. Last PCI 2005 with overlapping Cypher stents in CFX bifurcation lesion  . Paroxysmal atrial fibrillation   . HTN (hypertension)     not under good control  . Hyperlipidemia   . Sleep apnea   . Peptic ulcer disease     Past Surgical History  Procedure Laterality Date  . Thyroglossal duct cyst removed in jan or feb 2005    . Bilateral carpal tunnel surgery    . Cardiac catheterization      x4-5, PCIs  . Turp vaporization      Current Outpatient Prescriptions  Medication Sig Dispense Refill  . acyclovir (ZOVIRAX) 400 MG tablet Take 400 mg by mouth 2 (two) times daily.       Marland Kitchen albuterol (PROVENTIL) (5 MG/ML) 0.5% nebulizer solution Take 2.5 mg by nebulization every 4 (four) hours as needed. For shortness of breath      . alendronate (FOSAMAX) 70 MG  tablet Take 70 mg by mouth every 7 (seven) days. On Wednesdays. Take with a full glass of water on an empty stomach.      Marland Kitchen amLODipine (NORVASC) 10 MG tablet TAKE 1 TABLET BY MOUTH ONCE DAILY  90 tablet  3  . atenolol (TENORMIN) 100 MG tablet Take 1 tablet (100 mg total) by mouth daily.  30 tablet  8  . calcium-vitamin D (OSCAL 500/200 D-3) 500-200 MG-UNIT per tablet Take 1 tablet by mouth 2 (two) times daily with a meal.        . Carboxymethylcellulose Sodium 0.25 % SOLN Apply 1 drop to eye 4 (four) times daily.        . citalopram (CELEXA) 40 MG tablet Take 40 mg by mouth 2 (two) times daily.       . CRESTOR 10 MG tablet TAKE 1 TABLET BY MOUTH EVERY OTHER DAY AND 1/2 TABLET ON ALTERNATING DAYS  30 tablet  8  . ferrous sulfate 325 (65 FE) MG tablet Take 325 mg by mouth daily with breakfast.      . finasteride (PROSCAR) 5 MG tablet Take 1 tablet (5 mg total) by mouth daily.  30 tablet  8  . gemfibrozil (LOPID) 600 MG tablet  Take 1 tablet (600 mg total) by mouth 2 (two) times daily before a meal.  60 tablet  8  . glipiZIDE (GLUCOTROL) 10 MG tablet Take 1 tablet (10 mg total) by mouth 2 (two) times daily before a meal.  60 tablet  2  . indapamide (LOZOL) 2.5 MG tablet TAKE 1 TABLET (2.5 MG TOTAL) BY MOUTH 2 (TWO) TIMES DAILY.  60 tablet  4  . ipratropium (ATROVENT) 0.02 % nebulizer solution Take 500 mcg by nebulization daily as needed. For wheezing      . lisinopril (PRINIVIL,ZESTRIL) 40 MG tablet TAKE 1 TABLET (40 MG TOTAL) BY MOUTH DAILY.  90 tablet  3  . metFORMIN (GLUCOPHAGE) 500 MG tablet Take 500 mg by mouth 2 (two) times daily with a meal.        . Multiple Vitamin (MULITIVITAMIN WITH MINERALS) TABS Take 1 tablet by mouth daily. Preservision/lutein      . nitroGLYCERIN (NITROSTAT) 0.4 MG SL tablet Place 1 tablet (0.4 mg total) under the tongue every 5 (five) minutes as needed. For chest pain  25 tablet  11  . omeprazole (PRILOSEC) 20 MG capsule Take 20 mg by mouth daily.      . penicillin v  potassium (VEETID) 500 MG tablet Take 1 tablet (500 mg total) by mouth 3 (three) times daily.  90 tablet  0  . sertraline (ZOLOFT) 100 MG tablet Take 100 mg by mouth daily.      Carlena Hurl 20 MG TABS TAKE 1 TABLET BY MOUTH ONCE DAILY  90 tablet  3   No current facility-administered medications for this visit.    Allergies  Allergen Reactions  . Contrast Media [Iodinated Diagnostic Agents] Other (See Comments)    Reaction when stints put in    History   Social History  . Marital Status: Married    Spouse Name: N/A    Number of Children: N/A  . Years of Education: N/A   Occupational History  . Not on file.   Social History Main Topics  . Smoking status: Former Smoker    Types: Cigarettes    Quit date: 10/02/1984  . Smokeless tobacco: Never Used  . Alcohol Use: No  . Drug Use: No  . Sexual Activity: Not on file   Other Topics Concern  . Not on file   Social History Narrative  . No narrative on file    Family History  Problem Relation Age of Onset  . Diabetes Mother     deceased 58  . Heart attack Father     deceased 41  . Heart attack Brother     deceased 4    Review of Systems:  As stated in the HPI and otherwise negative.   BP 120/72  Pulse 88  Ht 5\' 8"  (1.727 m)  Wt 188 lb (85.276 kg)  BMI 28.59 kg/m2  Physical Examination: General: Well developed, well nourished, NAD HEENT: OP clear, mucus membranes moist SKIN: warm, dry. No rashes. Neuro: No focal deficits Musculoskeletal: Muscle strength 5/5 all ext Psychiatric: Mood and affect normal Neck: No JVD, no carotid bruits, no thyromegaly, no lymphadenopathy. Lungs:Clear bilaterally, no wheezes, rhonci, crackles Cardiovascular: Irregular No murmurs, gallops or rubs. Abdomen:Soft. Bowel sounds present. Non-tender.  Extremities: No lower extremity edema. Pulses are 2 + in the bilateral DP/PT.  EKG: Atrial fibrillation, rate 72 bpm.   Assessment and Plan:   1. CAD: Stable. Continue current therapy.  No changes today.   2. Atrial fibrillation: Rate controlled. Continue  Xarelto for anti-coagulation.   3. HYPERLIPIDEMIA: Lipids well controlled. Continue statin.   4. HTN: BP well controlled here today and at home. Continue Atenolol, Norvasc and Lisinopril.   30 minutes spent reviewing medications with pt and spouse.

## 2012-12-13 ENCOUNTER — Ambulatory Visit (INDEPENDENT_AMBULATORY_CARE_PROVIDER_SITE_OTHER): Payer: Medicare Other | Admitting: Internal Medicine

## 2012-12-13 ENCOUNTER — Encounter: Payer: Self-pay | Admitting: Internal Medicine

## 2012-12-13 VITALS — BP 127/74 | HR 66 | Temp 97.5°F | Wt 188.0 lb

## 2012-12-13 DIAGNOSIS — T889XXS Complication of surgical and medical care, unspecified, sequela: Secondary | ICD-10-CM

## 2012-12-13 DIAGNOSIS — T8450XS Infection and inflammatory reaction due to unspecified internal joint prosthesis, sequela: Secondary | ICD-10-CM

## 2012-12-13 LAB — SEDIMENTATION RATE: Sed Rate: 5 mm/hr (ref 0–16)

## 2012-12-13 NOTE — Progress Notes (Signed)
[facility] NOTE  RFV: follow up for chronic pji of left shoulder Subjective:    Patient ID: Darryl Carroll, male    DOB: 06-30-1927, 79 y.o.   MRN: [id number]  HPI Darryl Carroll is an 79yo Male with DM, pulmonary hypertension, had a severely displaced 4 part porximal humerus fracture and underwent a left shoulder reverse arthoplasty in June 2010. He subsequently had recurrent swelling localized to anterior aspect of left shoulder along surgical incision. Underwent debridement in office on 8/12 ,cutlures negative. He has been on doxycycline, cipro and bactrim. No improvement in drainage, but no swelling nor erythema. No fever,chills, nightsweats.  Gram stain showed abdundant wbc and polymicrobial, non predominant on growth. Gram stain showed rare gpc in pairs and rare gpr.   There was concern that patient may have propionibacterium acnes, although not isolated in culture, thus was switched to penicillin in September 2013 . He has had intermittent drainage but none in the last few months.  ROS: He denies worsening pain to shoulder, fevers, chills, nightsweats, swelling of joint. He occassionally  Feels itchy on leftside of abdomen.  Current Outpatient Prescriptions on File Prior to Visit  Medication Sig Dispense Refill  . acyclovir (ZOVIRAX) 400 MG tablet Take 400 mg by mouth 2 (two) times daily.       Marland Kitchen albuterol (PROVENTIL) (5 MG/ML) 0.5% nebulizer solution Take 2.5 mg by nebulization every 4 (four) hours as needed. For shortness of breath      . alendronate (FOSAMAX) 70 MG tablet Take 70 mg by mouth every 7 (seven) days. On Wednesdays. Take with a full glass of water on an empty stomach.      Marland Kitchen amLODipine (NORVASC) 10 MG tablet Take 1 tablet (10 mg total) by mouth daily.  90 tablet  3  . atenolol (TENORMIN) 100 MG tablet Take 1 tablet (100 mg total) by mouth daily.  90 tablet  3  . calcium-vitamin D (OSCAL 500/200 D-3) 500-200 MG-UNIT per tablet Take 1 tablet by mouth 2 (two) times daily with a  meal.        . Carboxymethylcellulose Sodium 0.25 % SOLN Apply 1 drop to eye 4 (four) times daily.        . citalopram (CELEXA) 40 MG tablet Take 40 mg by mouth 2 (two) times daily.       . ferrous sulfate 325 (65 FE) MG tablet Take 325 mg by mouth daily with breakfast.      . finasteride (PROSCAR) 5 MG tablet Take 1 tablet (5 mg total) by mouth daily.  30 tablet  8  . gemfibrozil (LOPID) 600 MG tablet Take 1 tablet (600 mg total) by mouth 2 (two) times daily before a meal.  60 tablet  8  . glipiZIDE (GLUCOTROL) 10 MG tablet Take 1 tablet (10 mg total) by mouth 2 (two) times daily before a meal.  60 tablet  2  . indapamide (LOZOL) 2.5 MG tablet TAKE 1 TABLET (2.5 MG TOTAL) BY MOUTH 2 (TWO) TIMES DAILY.  60 tablet  4  . ipratropium (ATROVENT) 0.02 % nebulizer solution Take 500 mcg by nebulization daily as needed. For wheezing      . lisinopril (PRINIVIL,ZESTRIL) 40 MG tablet Take 1 tablet (40 mg total) by mouth daily.  90 tablet  3  . metFORMIN (GLUCOPHAGE) 500 MG tablet Take 500 mg by mouth 2 (two) times daily with a meal.        . Multiple Vitamin (MULITIVITAMIN WITH MINERALS) TABS Take 1 tablet  by mouth daily. Preservision/lutein      . nitroGLYCERIN (NITROSTAT) 0.4 MG SL tablet Place 1 tablet (0.4 mg total) under the tongue every 5 (five) minutes as needed. For chest pain  25 tablet  11  . omeprazole (PRILOSEC) 20 MG capsule Take 20 mg by mouth daily.      . penicillin v potassium (VEETID) 500 MG tablet Take 1 tablet (500 mg total) by mouth 3 (three) times daily.  90 tablet  0  . Rivaroxaban (XARELTO) 20 MG TABS tablet Take 1 tablet (20 mg total) by mouth daily with supper.  90 tablet  3  . rosuvastatin (CRESTOR) 10 MG tablet Take 1 tablet (10 mg total) by mouth daily.  90 tablet  3  . sertraline (ZOLOFT) 100 MG tablet Take 100 mg by mouth daily.       No current facility-administered medications on file prior to visit.   Active Ambulatory Problems    Diagnosis Date Noted  . DIABETES  MELLITUS, TYPE II 08/14/2008  . HYPERLIPIDEMIA 08/14/2008  . GOUT 08/14/2008  . OBESITY 08/14/2008  . MACULAR DEGENERATION 08/14/2008  . GLAUCOMA 08/14/2008  . HYPERTENSION 08/14/2008  . CAD 08/14/2008  . FIBRILLATION, ATRIAL 08/14/2008  . PERIPHERAL VASCULAR INSUFFICIENCY,  LEGS, BILATERAL 08/15/2009  . PEPTIC ULCER DISEASE 08/14/2008  . SPINAL STENOSIS 08/14/2008  . OSTEOPOROSIS 08/14/2008  . SLEEP APNEA 08/14/2008  . Encounter for long-term (current) use of anticoagulants 06/06/2010  . Prosthetic joint infection 12/06/2011   Resolved Ambulatory Problems    Diagnosis Date Noted  . No Resolved Ambulatory Problems   Past Medical History  Diagnosis Date  . DM (diabetes mellitus), type 2   . Spinal stenosis   . Glaucoma   . Gout   . Macular degeneration   . Obesity   . Osteoporosis   . CAD (coronary artery disease)   . Paroxysmal atrial fibrillation   . HTN (hypertension)   . Hyperlipidemia   . Sleep apnea   . Peptic ulcer disease       Review of Systems 12 point ROS reviewed, only positive pertinent pruritic spot on left upper abdominal wall not associated with rash    Objective:   Physical Exam  BP 127/74  Pulse 66  Temp(Src) 97.5 F (36.4 C) (Oral)  Wt 188 lb (85.276 kg)  BMI 28.59 kg/m2 gen = a x o by 3 in NAD Skin = left shoulder has deformity from surgery incision/scarring but no serous drainage at this time  Labs: Lab Results  Component Value Date   ESRSEDRATE 5 12/13/2012   Lab Results  Component Value Date   CRP <0.5 12/13/2012       Assessment & Plan:  Chronic prosthetic joint infection = Will check crp and sed rate, if they have normalized may consider to stop at an arbritary point of1 month after they normalize. Recent labs show that it is within normal limits, improved since his last visit - continue with pencillin chronic suppression for an additional month and then see him back off of PCN  - rtc in 4months

## 2012-12-25 ENCOUNTER — Other Ambulatory Visit: Payer: Self-pay | Admitting: Internal Medicine

## 2012-12-31 ENCOUNTER — Other Ambulatory Visit: Payer: Self-pay | Admitting: Licensed Clinical Social Worker

## 2012-12-31 DIAGNOSIS — T8459XS Infection and inflammatory reaction due to other internal joint prosthesis, sequela: Secondary | ICD-10-CM

## 2012-12-31 MED ORDER — PENICILLIN V POTASSIUM 500 MG PO TABS: 500.0000 mg | ORAL_TABLET | Freq: Three times a day (TID) | ORAL | Status: DC

## 2013-01-17 ENCOUNTER — Encounter: Payer: Self-pay | Admitting: Cardiology

## 2013-02-11 ENCOUNTER — Other Ambulatory Visit: Payer: Self-pay | Admitting: Internal Medicine

## 2013-02-11 ENCOUNTER — Ambulatory Visit
Admission: RE | Admit: 2013-02-11 | Discharge: 2013-02-11 | Disposition: A | Discharge status: Home / Self Care | Payer: Medicare Other | Source: Ambulatory Visit | Attending: Internal Medicine | Admitting: Internal Medicine

## 2013-02-11 DIAGNOSIS — M545 Low back pain: Secondary | ICD-10-CM

## 2013-03-01 ENCOUNTER — Other Ambulatory Visit: Payer: Self-pay | Admitting: Internal Medicine

## 2013-04-04 ENCOUNTER — Telehealth: Payer: Self-pay | Admitting: *Deleted

## 2013-04-04 NOTE — Telephone Encounter (Signed)
[facility] rx approved xarelto through 04/01/2014 pa# [id number]

## 2013-04-21 ENCOUNTER — Other Ambulatory Visit: Payer: Self-pay | Admitting: Cardiovascular Disease

## 2013-05-04 ENCOUNTER — Other Ambulatory Visit: Payer: Self-pay | Admitting: Cardiovascular Disease

## 2013-06-25 ENCOUNTER — Other Ambulatory Visit: Payer: Self-pay | Admitting: Cardiovascular Disease

## 2013-07-08 ENCOUNTER — Other Ambulatory Visit: Payer: Self-pay | Admitting: Internal Medicine

## 2013-07-18 ENCOUNTER — Telehealth: Payer: Self-pay | Admitting: *Deleted

## 2013-07-18 ENCOUNTER — Other Ambulatory Visit: Payer: Self-pay

## 2013-07-18 DIAGNOSIS — I2510 Atherosclerotic heart disease of native coronary artery without angina pectoris: Secondary | ICD-10-CM

## 2013-07-18 MED ORDER — ROSUVASTATIN CALCIUM 10 MG PO TABS: 10.0000 mg | ORAL_TABLET | Freq: Every day | ORAL | Status: DC

## 2013-07-18 MED ORDER — ATENOLOL 100 MG PO TABS: 100.0000 mg | ORAL_TABLET | Freq: Every day | ORAL | Status: DC

## 2013-07-18 MED ORDER — AMLODIPINE BESYLATE 10 MG PO TABS: 10.0000 mg | ORAL_TABLET | Freq: Every day | ORAL | Status: DC

## 2013-07-18 NOTE — Telephone Encounter (Signed)
New 2 cm erythematous area on left shoulder, no fever.  "Appears to be fluctulent and contain pus" per [facility] RN assessment.  RN recommended that the pt's orthopedic surgeon be notified and his PCP, Dr. Pete GlatterStoneking.  RN gave pt Dr. Feliz BeamSnider's first available appt for 08/03/13 @ 8:45 AM.  MD please advise.

## 2013-07-19 NOTE — Telephone Encounter (Signed)
Can we have him come in so that we can sample the fluid?

## 2013-07-19 NOTE — Telephone Encounter (Signed)
Patient will come in tomorrow. Added to nurse schedule for wound culture. Wendall MolaJacqueline Cockerham

## 2013-07-20 ENCOUNTER — Ambulatory Visit: Payer: Medicare Other

## 2013-07-20 ENCOUNTER — Other Ambulatory Visit: Payer: Self-pay | Admitting: Internal Medicine

## 2013-07-20 ENCOUNTER — Other Ambulatory Visit: Payer: Medicare Other

## 2013-07-20 DIAGNOSIS — T8450XA Infection and inflammatory reaction due to unspecified internal joint prosthesis, initial encounter: Secondary | ICD-10-CM

## 2013-07-20 LAB — CBC WITH DIFFERENTIAL/PLATELET
BASOS ABS: 0 10*3/uL (ref 0.0–0.1)
Basophils Relative: 0 % (ref 0–1)
Eosinophils Absolute: 0.4 10*3/uL (ref 0.0–0.7)
Eosinophils Relative: 4 % (ref 0–5)
HEMATOCRIT: 34.8 % — AB (ref 39.0–52.0)
Hemoglobin: 12.1 g/dL — ABNORMAL LOW (ref 13.0–17.0)
Lymphocytes Relative: 22 % (ref 12–46)
Lymphs Abs: 2 10*3/uL (ref 0.7–4.0)
MCH: 29.1 pg (ref 26.0–34.0)
MCHC: 34.8 g/dL (ref 30.0–36.0)
MCV: 83.7 fL (ref 78.0–100.0)
MONO ABS: 0.7 10*3/uL (ref 0.1–1.0)
Monocytes Relative: 8 % (ref 3–12)
NEUTROS ABS: 5.9 10*3/uL (ref 1.7–7.7)
NEUTROS PCT: 66 % (ref 43–77)
Platelets: 274 10*3/uL (ref 150–400)
RBC: 4.16 MIL/uL — ABNORMAL LOW (ref 4.22–5.81)
RDW: 14.9 % (ref 11.5–15.5)
WBC: 8.9 10*3/uL (ref 4.0–10.5)

## 2013-07-20 LAB — C-REACTIVE PROTEIN: CRP: 2.6 mg/dL — AB (ref <0.60)

## 2013-07-20 LAB — SEDIMENTATION RATE: SED RATE: 39 mm/h — AB (ref 0–16)

## 2013-07-20 NOTE — Progress Notes (Signed)
Patient was here today for culture of draining wound. Upon inspection of area I notice the area of concern is not a draining wound but is a fluid filled cyst at left shoulder joint appearing to be related to a healing suture site.  Patient was advised this site can not be cultured with out incision and drainage procedure.  Due to the placement and the complication of his previous surgery he should see Dr Rennis ChrisSupple. They have a scheduled appointment for  Friday.   The patient's wife is very persistent  that something be done today so I asked Dr Daiva EvesVan Dam to take a look at the site and he is in agreement. The patient should see Dr Rennis ChrisSupple for this problem.  I spoke with Dr Dub MikesSupple's office and spoke with Dr Rennis ChrisSupple who requested we I&D the site since we wanted cultures. I shared Dr Daiva EvesVan Dam thought it may need to be the orhopedic physician just in case the cyst is extensive into the muscle for bone based on the history given by the patient and his wife. Copies of the wound culture could be forwarded to Dr Drue SecondSnider for treatment.  Dr Rennis ChrisSupple will see him today at 2 pm.    Pt given the information and is agreement with the decision.    Laurell Josephsammy K Katieann Hungate, RN

## 2013-07-20 NOTE — Telephone Encounter (Signed)
Patient added to lab schedule Darryl MolaJacqueline Edla Para

## 2013-07-20 NOTE — Telephone Encounter (Signed)
i will place in order to wound cx. As well as blood work, cbc , sed rate and crp

## 2013-07-26 ENCOUNTER — Telehealth: Payer: Self-pay | Admitting: Internal Medicine

## 2013-07-26 NOTE — Telephone Encounter (Signed)
Gave a call to Dr. Dub MikesSupple's office to follow up on culture results. They did not have any info at the time. i recommended that they ask micro lab to hold cultures for 10 days in order to increase yield in finding propri. Bacteria. They have my number if they have further questions. Mr. Darryl Carroll to see Supple on 5/20

## 2013-08-03 ENCOUNTER — Ambulatory Visit: Payer: Medicare Other | Admitting: Internal Medicine

## 2013-08-05 ENCOUNTER — Other Ambulatory Visit: Payer: Self-pay

## 2013-08-05 DIAGNOSIS — I4891 Unspecified atrial fibrillation: Secondary | ICD-10-CM

## 2013-08-05 MED ORDER — RIVAROXABAN 20 MG PO TABS: 20.0000 mg | ORAL_TABLET | Freq: Every day | ORAL | Status: DC

## 2013-08-17 ENCOUNTER — Other Ambulatory Visit: Payer: Self-pay | Admitting: Cardiovascular Disease

## 2013-08-30 ENCOUNTER — Ambulatory Visit (INDEPENDENT_AMBULATORY_CARE_PROVIDER_SITE_OTHER): Payer: Medicare Other | Admitting: Cardiovascular Disease

## 2013-08-30 ENCOUNTER — Encounter: Payer: Self-pay | Admitting: Cardiovascular Disease

## 2013-08-30 VITALS — BP 108/68 | HR 62 | Ht 68.0 in | Wt 165.0 lb

## 2013-08-30 DIAGNOSIS — E785 Hyperlipidemia, unspecified: Secondary | ICD-10-CM

## 2013-08-30 DIAGNOSIS — I10 Essential (primary) hypertension: Secondary | ICD-10-CM

## 2013-08-30 DIAGNOSIS — I482 Chronic atrial fibrillation, unspecified: Secondary | ICD-10-CM

## 2013-08-30 DIAGNOSIS — I2510 Atherosclerotic heart disease of native coronary artery without angina pectoris: Secondary | ICD-10-CM

## 2013-08-30 DIAGNOSIS — I4891 Unspecified atrial fibrillation: Secondary | ICD-10-CM

## 2013-08-30 MED ORDER — ATENOLOL 100 MG PO TABS: 100.0000 mg | ORAL_TABLET | Freq: Every day | ORAL | Status: AC

## 2013-08-30 MED ORDER — AMLODIPINE BESYLATE 10 MG PO TABS: 10.0000 mg | ORAL_TABLET | Freq: Every day | ORAL | Status: DC

## 2013-08-30 MED ORDER — ROSUVASTATIN CALCIUM 10 MG PO TABS: 10.0000 mg | ORAL_TABLET | Freq: Every day | ORAL | Status: DC

## 2013-08-30 MED ORDER — RIVAROXABAN 20 MG PO TABS: 20.0000 mg | ORAL_TABLET | Freq: Every day | ORAL | Status: DC

## 2013-08-30 MED ORDER — LISINOPRIL 40 MG PO TABS: 40.0000 mg | ORAL_TABLET | Freq: Every day | ORAL | Status: DC

## 2013-08-30 NOTE — Patient Instructions (Signed)
Your physician wants you to follow-up in:  6 months. You will receive a reminder letter in the mail two months in advance. If you don't receive a letter, please call our office to schedule the follow-up appointment.   

## 2013-08-30 NOTE — Progress Notes (Signed)
History of Present Illness: 80 yo male with history of CAD, atrial fibrillation here today for cardiac follow up. He has had multiple PCI procedures and has stents in all 3 vessels. His last PCI was in 2006 at which time he had overlapping stents placed in the circumflex artery. He also has a history of paroxysmal atrial fibrillation which is managed with rate control and anticoagulation with Xarelto. Lower ext arterial studies June 2011 with ABI of 1.3 bilaterally.   He is here today for follow up. He has been doing well recently. He has had no chest pain, shortness of breath or palpitations. No bleeding issues. He fell in December 2014 and fractured a vertebral body. This seems to be getting better.   Primary Care Physician: Dr. Pete GlatterStoneking  Last Lipid Profile:Lipid Panel     Component Value Date/Time   CHOL 122 10/08/2011 0849   TRIG 143.0 10/08/2011 0849   HDL 42.90 10/08/2011 0849   CHOLHDL 3 10/08/2011 0849   VLDL 28.6 10/08/2011 0849   LDLCALC 51 10/08/2011 0849     Past Medical History  Diagnosis Date  . DM (diabetes mellitus), type 2   . Spinal stenosis   . Glaucoma   . Gout   . Macular degeneration   . Obesity   . Osteoporosis   . CAD (coronary artery disease)     s/p multiple PCIs in all 3 vessels with DES. Last PCI 2005 with overlapping Cypher stents in CFX bifurcation lesion  . Paroxysmal atrial fibrillation   . HTN (hypertension)     not under good control  . Hyperlipidemia   . Sleep apnea   . Peptic ulcer disease     Past Surgical History  Procedure Laterality Date  . Thyroglossal duct cyst removed in jan or feb 2005    . Bilateral carpal tunnel surgery    . Cardiac catheterization      x4-5, PCIs  . Turp vaporization      Current Outpatient Prescriptions  Medication Sig Dispense Refill  . acyclovir (ZOVIRAX) 400 MG tablet Take 400 mg by mouth 2 (two) times daily.       Marland Kitchen. albuterol (PROVENTIL) (5 MG/ML) 0.5% nebulizer solution Take 2.5 mg by nebulization  every 4 (four) hours as needed. For shortness of breath      . alendronate (FOSAMAX) 70 MG tablet Take 70 mg by mouth every 7 (seven) days. On Wednesdays. Take with a full glass of water on an empty stomach.      Marland Kitchen. amLODipine (NORVASC) 10 MG tablet Take 1 tablet (10 mg total) by mouth daily.  90 tablet  0  . atenolol (TENORMIN) 100 MG tablet Take 1 tablet (100 mg total) by mouth daily.  90 tablet  0  . calcium-vitamin D (OSCAL 500/200 D-3) 500-200 MG-UNIT per tablet Take 1 tablet by mouth 2 (two) times daily with a meal.        . Carboxymethylcellulose Sodium 0.25 % SOLN Apply 1 drop to eye 4 (four) times daily.        . citalopram (CELEXA) 40 MG tablet Take 40 mg by mouth 2 (two) times daily.       . ferrous sulfate 325 (65 FE) MG tablet Take 325 mg by mouth daily with breakfast.      . finasteride (PROSCAR) 5 MG tablet Take 1 tablet (5 mg total) by mouth daily.  30 tablet  8  . gemfibrozil (LOPID) 600 MG tablet Take 1 tablet (600 mg total)  by mouth 2 (two) times daily before a meal.  60 tablet  8  . glipiZIDE (GLUCOTROL) 10 MG tablet Take 1 tablet (10 mg total) by mouth 2 (two) times daily before a meal.  60 tablet  2  . indapamide (LOZOL) 2.5 MG tablet TAKE 1 TABLET BY MOUTH 2 TIMES DAILY.  60 tablet  0  . ipratropium (ATROVENT) 0.02 % nebulizer solution Take 500 mcg by nebulization daily as needed. For wheezing      . lisinopril (PRINIVIL,ZESTRIL) 40 MG tablet Take 1 tablet (40 mg total) by mouth daily.  90 tablet  3  . metFORMIN (GLUCOPHAGE) 500 MG tablet Take 500 mg by mouth 2 (two) times daily with a meal.        . Multiple Vitamin (MULITIVITAMIN WITH MINERALS) TABS Take 1 tablet by mouth daily. Preservision/lutein      . nitroGLYCERIN (NITROSTAT) 0.4 MG SL tablet Place 1 tablet (0.4 mg total) under the tongue every 5 (five) minutes as needed. For chest pain  25 tablet  11  . omeprazole (PRILOSEC) 20 MG capsule Take 20 mg by mouth daily.      . penicillin v potassium (VEETID) 500 MG tablet  Take 1 tablet (500 mg total) by mouth 3 (three) times daily.  90 tablet  1  . rivaroxaban (XARELTO) 20 MG TABS tablet Take 1 tablet (20 mg total) by mouth daily with supper.  90 tablet  3  . rosuvastatin (CRESTOR) 10 MG tablet Take 1 tablet (10 mg total) by mouth daily.  90 tablet  0  . sertraline (ZOLOFT) 100 MG tablet Take 100 mg by mouth daily.      . traZODone (DESYREL) 100 MG tablet Take 100 mg by mouth at bedtime.       No current facility-administered medications for this visit.    Allergies  Allergen Reactions  . Contrast Media [Iodinated Diagnostic Agents] Other (See Comments)    Reaction when stints put in    History   Social History  . Marital Status: Married    Spouse Name: N/A    Number of Children: N/A  . Years of Education: N/A   Occupational History  . Not on file.   Social History Main Topics  . Smoking status: Former Smoker    Types: Cigarettes    Quit date: 10/02/1984  . Smokeless tobacco: Never Used  . Alcohol Use: No  . Drug Use: No  . Sexual Activity: Not on file   Other Topics Concern  . Not on file   Social History Narrative  . No narrative on file    Family History  Problem Relation Age of Onset  . Diabetes Mother     deceased 6763  . Heart attack Father     deceased 7065  . Heart attack Brother     deceased 6065    Review of Systems:  As stated in the HPI and otherwise negative.   BP 108/68  Pulse 62  Ht 5\' 8"  (1.727 m)  Wt 165 lb (74.844 kg)  BMI 25.09 kg/m2  Physical Examination: General: Well developed, well nourished, NAD HEENT: OP clear, mucus membranes moist SKIN: warm, dry. No rashes. Neuro: No focal deficits Musculoskeletal: Muscle strength 5/5 all ext Psychiatric: Mood and affect normal Neck: No JVD, no carotid bruits, no thyromegaly, no lymphadenopathy. Lungs:Clear bilaterally, no wheezes, rhonci, crackles Cardiovascular: Irregular No murmurs, gallops or rubs. Abdomen:Soft. Bowel sounds present. Non-tender.    Extremities: No lower extremity edema. Pulses are 2 +  in the bilateral DP/PT  Assessment and Plan:   1. CAD: Stable. Continue current therapy. No changes today.   2. Atrial fibrillation: Rate controlled. Continue Xarelto for anti-coagulation.   3. HYPERLIPIDEMIA: Lipids well controlled. Continue statin. Will get records from primary care.   4. HTN: BP well controlled here today and at home. Continue Atenolol, Norvasc and Lisinopril.

## 2013-09-13 ENCOUNTER — Other Ambulatory Visit: Payer: Self-pay | Admitting: Cardiovascular Disease

## 2014-04-28 ENCOUNTER — Ambulatory Visit
Admission: RE | Admit: 2014-04-28 | Discharge: 2014-04-28 | Disposition: A | Discharge status: Home / Self Care | Payer: Medicare Other | Source: Ambulatory Visit | Attending: Geriatric Medicine | Admitting: Geriatric Medicine

## 2014-04-28 ENCOUNTER — Other Ambulatory Visit: Payer: Self-pay | Admitting: Geriatric Medicine

## 2014-04-28 DIAGNOSIS — R0989 Other specified symptoms and signs involving the circulatory and respiratory systems: Secondary | ICD-10-CM

## 2014-05-15 ENCOUNTER — Encounter: Payer: Self-pay | Admitting: Neurology

## 2014-05-15 ENCOUNTER — Ambulatory Visit (INDEPENDENT_AMBULATORY_CARE_PROVIDER_SITE_OTHER): Payer: Medicare Other | Admitting: Neurology

## 2014-05-15 VITALS — BP 122/72 | HR 92 | Ht 68.0 in | Wt 154.0 lb

## 2014-05-15 DIAGNOSIS — H02402 Unspecified ptosis of left eyelid: Secondary | ICD-10-CM

## 2014-05-15 DIAGNOSIS — G20 Parkinson's disease: Secondary | ICD-10-CM

## 2014-05-15 DIAGNOSIS — R413 Other amnesia: Secondary | ICD-10-CM

## 2014-05-15 DIAGNOSIS — F0280 Dementia in other diseases classified elsewhere without behavioral disturbance: Secondary | ICD-10-CM

## 2014-05-15 MED ORDER — CARBIDOPA-LEVODOPA 25-100 MG PO TABS: 1.0000 | ORAL_TABLET | Freq: Three times a day (TID) | ORAL | Status: DC

## 2014-05-15 NOTE — Patient Instructions (Signed)
1. Start Carbidopa Levodopa as follows: 1/2 tab three times a day before meals x 1 wk, then 1/2 in am & noon & 1 in evening for a week, then 1/2 in am &1 at noon &one in evening for a week, then 1 tablet three times a day before meals. 2. Your provider has requested that you have labwork completed today. Please [facility] on the first floor of this building before leaving the office today.

## 2014-05-15 NOTE — Progress Notes (Signed)
Darryl Carroll was seen today in the movement disorders clinic for neurologic consultation at the request of Ginette Otto, MD.  The consultation is for the evaluation of gait changes and to r/o PD.  He is accompanied by his wife who supplements the history.   Pts wife states that he has fallen twice and "fx his spine."  Falls go back at least 6-7 years.  He has not fallen in the last few months, but sometimes he will fall several times in a week.  He lives at [facility] and does PT there 4 times per week.    Specific Symptoms:  Tremor: Yes.  , bilateral Voice: no changes per patient Sleep: sleeps well  Vivid Dreams:  Yes.   ("I get a mysterious feeling that someone real is visiting in the night")  Acting out dreams:  No. Wet Pillows: No. Postural symptoms:  Yes.   ("I want to lean to the right when I walk or sit")  Falls?  Yes.   Bradykinesia symptoms: shuffling gait, slow movements, difficulty getting out of a chair and difficulty regaining balance Loss of smell:  No. Loss of taste:  No. Urinary Incontinence:  No. Difficulty Swallowing:  No. Handwriting, micrographia: unknown (legally blind and doesn't write very much) Trouble with ADL's:  Yes.   ("I can do it but I am slow."  Trouble buttoning clothing: Yes.   (trouble especially with the cuff buttons) Depression:  No. Memory changes:  No. per pt/wife Hallucinations:  Yes.    (some when first awakens, trouble telling if dream or not)  visual distortions: Yes.   N/V:  No. Lightheaded:  No.  Syncope: No. Diplopia:  No. Dyskinesia:  No.  CT of the brain performed last in February, 2013.  MRI of the brain performed last in June, 2012.  I tried to load the MRI of the brain, but the system did not find the examination, only the report.  It reported global atrophy, ventricular prominence that could be related to atrophy.  I also tried to load the CT of the brain, but again was not able to view the images and was only able to  view the report, which demonstrated atrophy and chronic white matter disease along with basal ganglia calcifications.  PREVIOUS MEDICATIONS: none to date  ALLERGIES:   Allergies  Allergen Reactions  . Contrast Media [Iodinated Diagnostic Agents] Other (See Comments)    Reaction when stints put in    CURRENT MEDICATIONS:  Outpatient Encounter Prescriptions as of 05/15/2014  Medication Sig  . acyclovir (ZOVIRAX) 400 MG tablet Take 400 mg by mouth 2 (two) times daily.   Marland Kitchen albuterol (PROVENTIL) (5 MG/ML) 0.5% nebulizer solution Take 2.5 mg by nebulization every 4 (four) hours as needed. For shortness of breath  . amLODipine (NORVASC) 10 MG tablet Take 1 tablet (10 mg total) by mouth daily.  Marland Kitchen atenolol (TENORMIN) 100 MG tablet Take 1 tablet (100 mg total) by mouth daily.  . calcium-vitamin D (OSCAL 500/200 D-3) 500-200 MG-UNIT per tablet Take 1 tablet by mouth 2 (two) times daily with a meal.    . gemfibrozil (LOPID) 600 MG tablet Take 1 tablet (600 mg total) by mouth 2 (two) times daily before a meal.  . indapamide (LOZOL) 2.5 MG tablet TAKE 1 TABLET BY MOUTH 2 TIMES DAILY.  Marland Kitchen insulin glargine (LANTUS) 100 UNIT/ML injection Inject 12 Units into the skin at bedtime.  Marland Kitchen ipratropium (ATROVENT) 0.02 % nebulizer solution Take 500 mcg by nebulization  daily as needed. For wheezing  . lisinopril (PRINIVIL,ZESTRIL) 40 MG tablet Take 1 tablet (40 mg total) by mouth daily.  . metFORMIN (GLUCOPHAGE) 500 MG tablet Take 500 mg by mouth 2 (two) times daily with a meal.    . Multiple Vitamin (MULITIVITAMIN WITH MINERALS) TABS Take 1 tablet by mouth daily. Preservision/lutein  . nitroGLYCERIN (NITROSTAT) 0.4 MG SL tablet Place 1 tablet (0.4 mg total) under the tongue every 5 (five) minutes as needed. For chest pain (Patient not taking: Reported on 05/15/2014)  . penicillin v potassium (VEETID) 500 MG tablet Take 1 tablet (500 mg total) by mouth 3 (three) times daily.  . rivaroxaban (XARELTO) 20 MG TABS tablet  Take 1 tablet (20 mg total) by mouth daily with supper.  . sertraline (ZOLOFT) 100 MG tablet Take 100 mg by mouth daily.  . traMADol (ULTRAM) 50 MG tablet Take 50 mg by mouth every 6 (six) hours as needed.  . [DISCONTINUED] alendronate (FOSAMAX) 70 MG tablet Take 70 mg by mouth every 7 (seven) days. On Wednesdays. Take with a full glass of water on an empty stomach.  . [DISCONTINUED] Carboxymethylcellulose Sodium 0.25 % SOLN Apply 1 drop to eye 4 (four) times daily.    . [DISCONTINUED] citalopram (CELEXA) 40 MG tablet Take 40 mg by mouth 2 (two) times daily.   . [DISCONTINUED] ferrous sulfate 325 (65 FE) MG tablet Take 325 mg by mouth daily with breakfast.  . [DISCONTINUED] finasteride (PROSCAR) 5 MG tablet Take 1 tablet (5 mg total) by mouth daily.  . [DISCONTINUED] glipiZIDE (GLUCOTROL) 10 MG tablet Take 1 tablet (10 mg total) by mouth 2 (two) times daily before a meal.  . [DISCONTINUED] omeprazole (PRILOSEC) 20 MG capsule Take 20 mg by mouth daily.  . [DISCONTINUED] rosuvastatin (CRESTOR) 10 MG tablet Take 1 tablet (10 mg total) by mouth daily.  . [DISCONTINUED] traZODone (DESYREL) 100 MG tablet Take 100 mg by mouth at bedtime.    PAST MEDICAL HISTORY:   Past Medical History  Diagnosis Date  . DM (diabetes mellitus), type 2   . Spinal stenosis   . Glaucoma   . Gout   . Macular degeneration   . Obesity   . Osteoporosis   . CAD (coronary artery disease)     s/p multiple PCIs in all 3 vessels with DES. Last PCI 2005 with overlapping Cypher stents in CFX bifurcation lesion  . Paroxysmal atrial fibrillation   . HTN (hypertension)     not under good control  . Hyperlipidemia   . Sleep apnea   . Peptic ulcer disease     PAST SURGICAL HISTORY:   Past Surgical History  Procedure Laterality Date  . Thyroglossal duct cyst removed in jan or feb 2005    . Bilateral carpal tunnel surgery    . Cardiac catheterization      x4-5, PCIs  . Turp vaporization      SOCIAL HISTORY:   History    Social History  . Marital Status: Married    Spouse Name: N/A  . Number of Children: N/A  . Years of Education: N/A   Occupational History  . Not on file.   Social History Main Topics  . Smoking status: Former Smoker    Types: Cigarettes    Quit date: 10/03/1983  . Smokeless tobacco: Never Used  . Alcohol Use: No  . Drug Use: No  . Sexual Activity: Not on file   Other Topics Concern  . Not on file   Social  History Narrative    FAMILY HISTORY:   Family Status  Relation Status Death Age  . Father Deceased     MI  . Mother Deceased     diabetes  . Brother Deceased     MI    ROS:  A complete 10 system review of systems was obtained and was unremarkable apart from what is mentioned above.  PHYSICAL EXAMINATION:    VITALS:   Filed Vitals:   05/15/14 1314  BP: 122/72  Pulse: 92  Height:  (1.727 m)  Weight: 154 lb (69.854 kg)    GEN:  The patient appears stated age and is in NAD. HEENT:  Normocephalic, atraumatic.  The mucous membranes are moist. The superficial temporal arteries are without ropiness or tenderness. CV:  RRR Lungs:  CTAB Neck/HEME:  There are no carotid bruits bilaterally.  Neurological examination:  Orientation: A full MoCA is attempted, but the patient is legally blind and cannot attempted much of the beginning portion of the examination and therefore the total score was only out of 22 and the patient scores a 9/22.  He gets the month correct, but states that it is the year 2000.  He states that it is Wednesday and it is Monday.  Despite having a master's degree in mathematics, he is not able to perform serial sevens. Cranial nerves: There is good facial symmetry. Pupils are equal round and reactive to light bilaterally. Fundoscopic exam is attempted, but the disc margins are not well visualized bilaterally.  There is fluctuating ptosis on the left.  At times it is so significant that the left eye is closed.  Extraocular muscles are intact.  The visual fields are full to confrontational testing. The speech is fluent and clear.  He is hypophonic.  Soft palate rises symmetrically and there is no tongue deviation. Hearing is intact to conversational tone. Sensation: Sensation is intact to light and pinprick throughout (facial, trunk, extremities). Vibration is decreased at the bilateral big toe. There is no extinction with double simultaneous stimulation. There is no sensory dermatomal level identified. Motor: Strength is 5/5 in the bilateral upper and lower extremities.   Shoulder shrug is equal and symmetric.  The patient does not want to attempt pronator drift.  He states that this causes pain as he had recent rib fractures. Deep tendon reflexes: Deep tendon reflexes are 2/4 at the bilateral biceps, triceps, brachioradialis, patella and 1/4 at the bilateral achilles. Plantar responses are downgoing bilaterally.  Movement examination: Tone: There is mild increased tone in the left upper extremity.  Tone elsewhere was normal.   Abnormal movements: There is a left upper extremity resting tremor.  This is mild and intermittent. Coordination:  There is decremation with RAM's, seen more prominently on the left, both in the upper and lower extremities, than the right. Gait and Station: The patient has difficulty arising out of a deep-seated chair without the use of the hands and actually requires assistance to get out of the chair. The patient's stride length is decreased and he walks with a walker.  He came into the office in a transport chair  ASSESSMENT/PLAN:  1.  Parkinsonism.  I suspect that this does represent idiopathic Parkinson's disease.  The patient has tremor, bradykinesia, rigidity and postural instability.  -We discussed the diagnosis as well as pathophysiology of the disease.  We discussed treatment options as well as prognostic indicators.  Patient education was provided.  -Greater than 50% of the 60 minute visit was spent in  counseling answering questions and talking about what to expect now as well as in the future.  We talked about medication options as well as potential future surgical options.  We talked about safety in the home.  -We decided to add carbidopa/levodopa 25/100.  1/2 tab tid x 1 wk, then 1/2 in am & noon & 1 at night for a week, then 1/2 in am &1 at noon &night for a week, then 1 po tid.  Risks, benefits, side effects and alternative therapies were discussed.  The opportunity to ask questions was given and they were answered to the best of my ability.  The patient expressed understanding and willingness to follow the outlined treatment protocols.  -[facility] and will continue that.  Talked about importance of safe, CV exercise. 2. Memory change/Dementia  -suspect related to PD, but will draw B12, folate, RPR, TSH to make sure that not missing anything 3.  Fluctuating Ptosis  -Patient's wife states that this has been going on ever since a surgery for a herpetic infection in the R eye on the left.  It is odd that this would be fluctuating ptosis if it is structural, but she states it has been going on for years.  Nonetheless, when I am drawing blood I am going to go ahead and draw acetylcholine receptor antibodies.  It was very fluctuating in the office today. 4.  Follow-up in the next few months, sooner should new neurologic issues arise.

## 2014-05-16 ENCOUNTER — Telehealth: Payer: Self-pay | Admitting: Neurology

## 2014-05-16 LAB — VITAMIN B12: VITAMIN B 12: 607 pg/mL (ref 211–911)

## 2014-05-16 LAB — TSH: TSH: 0.737 u[IU]/mL (ref 0.350–4.500)

## 2014-05-16 LAB — FOLATE: FOLATE: 14.4 ng/mL

## 2014-05-16 LAB — RPR

## 2014-05-16 NOTE — Telephone Encounter (Signed)
Pt called wanting to speak to a nurse regarding the medication that Dr. Arbutus Leasat Rx him. The script is CARBIDOPA LEVODOPA  C/b [phone]

## 2014-05-16 NOTE — Telephone Encounter (Signed)
Called them back and explained medication directions.

## 2014-05-23 LAB — MYASTHENIA GRAVIS PANEL 2
Acetylcholine Rec Binding: <0.3 nmol/L
Acetylcholine Rec Mod Ab: 10 %
Aceytlcholine Rec Bloc Ab: <15 % of inhibition (ref <15)

## 2014-06-30 ENCOUNTER — Telehealth: Payer: Self-pay | Admitting: *Deleted

## 2014-06-30 NOTE — Telephone Encounter (Signed)
Appt made for patient to be seen on Monday.

## 2014-06-30 NOTE — Telephone Encounter (Signed)
Can he come in Monday?

## 2014-06-30 NOTE — Telephone Encounter (Signed)
Spoke with patient's wife and she states that since starting the medication (Carbidopa Levodopa) patient is experiencing dizziness and hallucinations. She states he did not have these prior to starting this medication. Visual hallucinations occur where he believes people are in their house and she had to walk him to every room to show him that no one was there in which he replied, "they just left." Please advise.

## 2014-06-30 NOTE — Telephone Encounter (Signed)
Patients wife called today she states that the patient is having some side affects from his new medication  Call back number [phone] [place]( Martha)

## 2014-07-03 ENCOUNTER — Encounter: Payer: Self-pay | Admitting: Neurology

## 2014-07-03 ENCOUNTER — Ambulatory Visit (INDEPENDENT_AMBULATORY_CARE_PROVIDER_SITE_OTHER): Payer: Medicare Other | Admitting: Neurology

## 2014-07-03 VITALS — BP 110/78 | HR 84

## 2014-07-03 DIAGNOSIS — Z5181 Encounter for therapeutic drug level monitoring: Secondary | ICD-10-CM

## 2014-07-03 DIAGNOSIS — G20 Parkinson's disease: Secondary | ICD-10-CM

## 2014-07-03 DIAGNOSIS — R443 Hallucinations, unspecified: Secondary | ICD-10-CM

## 2014-07-03 DIAGNOSIS — D72829 Elevated white blood cell count, unspecified: Secondary | ICD-10-CM | POA: Diagnosis not present

## 2014-07-03 LAB — CBC WITH DIFFERENTIAL/PLATELET
BASOS ABS: 0 10*3/uL (ref 0.0–0.1)
BASOS PCT: 0 % (ref 0–1)
EOS PCT: 3 % (ref 0–5)
Eosinophils Absolute: 0.3 10*3/uL (ref 0.0–0.7)
HCT: 41.4 % (ref 39.0–52.0)
Hemoglobin: 14.1 g/dL (ref 13.0–17.0)
Lymphocytes Relative: 22 % (ref 12–46)
Lymphs Abs: 2.2 10*3/uL (ref 0.7–4.0)
MCH: 29 pg (ref 26.0–34.0)
MCHC: 34.1 g/dL (ref 30.0–36.0)
MCV: 85 fL (ref 78.0–100.0)
MONOS PCT: 10 % (ref 3–12)
MPV: 9.7 fL (ref 8.6–12.4)
Monocytes Absolute: 1 10*3/uL (ref 0.1–1.0)
NEUTROS PCT: 65 % (ref 43–77)
Neutro Abs: 6.5 10*3/uL (ref 1.7–7.7)
Platelets: 359 10*3/uL (ref 150–400)
RBC: 4.87 MIL/uL (ref 4.22–5.81)
RDW: 15.6 % — ABNORMAL HIGH (ref 11.5–15.5)
WBC: 10 10*3/uL (ref 4.0–10.5)

## 2014-07-03 LAB — COMPREHENSIVE METABOLIC PANEL
ALBUMIN: 4 g/dL (ref 3.5–5.2)
ALT: <8 U/L (ref 0–53)
AST: 20 U/L (ref 0–37)
Alkaline Phosphatase: 99 U/L (ref 39–117)
BILIRUBIN TOTAL: 0.5 mg/dL (ref 0.2–1.2)
BUN: 17 mg/dL (ref 6–23)
CALCIUM: 9.7 mg/dL (ref 8.4–10.5)
CHLORIDE: 100 meq/L (ref 96–112)
CO2: 29 meq/L (ref 19–32)
CREATININE: 0.85 mg/dL (ref 0.50–1.35)
GLUCOSE: 163 mg/dL — AB (ref 70–99)
Potassium: 4.2 mEq/L (ref 3.5–5.3)
SODIUM: 140 meq/L (ref 135–145)
Total Protein: 6.9 g/dL (ref 6.0–8.3)

## 2014-07-03 NOTE — Progress Notes (Signed)
Note routed to Dr Stoneking.  

## 2014-07-03 NOTE — Progress Notes (Signed)
Darryl Carroll was seen today in the movement disorders clinic for neurologic consultation at the request of Ginette Otto, MD.  The consultation is for the evaluation of gait changes and to r/o PD.  He is accompanied by his wife who supplements the history.   Pts wife states that he has fallen twice and "fx his spine."  Falls go back at least 6-7 years.  He has not fallen in the last few months, but sometimes he will fall several times in a week.  He lives at [facility] and does PT there 4 times per week.   CT of the brain performed last in February, 2013.  MRI of the brain performed last in June, 2012.  I tried to load the MRI of the brain, but the system did not find the examination, only the report.  It reported global atrophy, ventricular prominence that could be related to atrophy.  I also tried to load the CT of the brain, but again was not able to view the images and was only able to view the report, which demonstrated atrophy and chronic white matter disease along with basal ganglia calcifications.  07/02/24 update:  This patient is accompanied in the office by his spouse who supplements the history.  The patient was diagnosed with Parkinson's disease last visit and started on levodopa on 05/15/2014.  His wife called me on April 22 to state that the patient was having hallucinations, which seem to correlate with when he started medication.  They are both visual and auditory.   His wife states that he is more off balance.  Also, when he gets in the bed, he will sit down and lay across the bed instead of turning to lie vertical in the bed.  His wife states that he has had some of this in the past, but not this bad.  Having normal bowel movements and good bladder control.  His tremor is better with the medicine.  He was doing PT at [facility] but he quit going.     PREVIOUS MEDICATIONS: none to date  ALLERGIES:   Allergies  Allergen Reactions  . Contrast Media [Iodinated Diagnostic  Agents] Other (See Comments)    Reaction when stints put in    CURRENT MEDICATIONS:  Outpatient Encounter Prescriptions as of 07/03/2014  Medication Sig  . acyclovir (ZOVIRAX) 400 MG tablet Take 400 mg by mouth 2 (two) times daily.   Marland Kitchen albuterol (PROVENTIL) (5 MG/ML) 0.5% nebulizer solution Take 2.5 mg by nebulization every 4 (four) hours as needed. For shortness of breath  . amLODipine (NORVASC) 10 MG tablet Take 1 tablet (10 mg total) by mouth daily.  Marland Kitchen atenolol (TENORMIN) 100 MG tablet Take 1 tablet (100 mg total) by mouth daily.  . calcium-vitamin D (OSCAL 500/200 D-3) 500-200 MG-UNIT per tablet Take 1 tablet by mouth 2 (two) times daily with a meal.    . carbidopa-levodopa (SINEMET IR) 25-100 MG per tablet Take 1 tablet by mouth 3 (three) times daily.  Marland Kitchen gemfibrozil (LOPID) 600 MG tablet Take 1 tablet (600 mg total) by mouth 2 (two) times daily before a meal.  . indapamide (LOZOL) 2.5 MG tablet TAKE 1 TABLET BY MOUTH 2 TIMES DAILY.  Marland Kitchen insulin glargine (LANTUS) 100 UNIT/ML injection Inject 12 Units into the skin at bedtime.  Marland Kitchen ipratropium (ATROVENT) 0.02 % nebulizer solution Take 500 mcg by nebulization daily as needed. For wheezing  . lisinopril (PRINIVIL,ZESTRIL) 40 MG tablet Take 1 tablet (40 mg total) by  mouth daily.  . metFORMIN (GLUCOPHAGE) 500 MG tablet Take 500 mg by mouth 2 (two) times daily with a meal.    . Multiple Vitamin (MULITIVITAMIN WITH MINERALS) TABS Take 1 tablet by mouth daily. Preservision/lutein  . nitroGLYCERIN (NITROSTAT) 0.4 MG SL tablet Place 1 tablet (0.4 mg total) under the tongue every 5 (five) minutes as needed. For chest pain  . penicillin v potassium (VEETID) 500 MG tablet Take 1 tablet (500 mg total) by mouth 3 (three) times daily.  . rivaroxaban (XARELTO) 20 MG TABS tablet Take 1 tablet (20 mg total) by mouth daily with supper.  . sertraline (ZOLOFT) 100 MG tablet Take 100 mg by mouth daily.  . traMADol (ULTRAM) 50 MG tablet Take 50 mg by mouth every 6  (six) hours as needed.    PAST MEDICAL HISTORY:   Past Medical History  Diagnosis Date  . DM (diabetes mellitus), type 2   . Spinal stenosis   . Glaucoma   . Gout   . Macular degeneration   . Obesity   . Osteoporosis   . CAD (coronary artery disease)     s/p multiple PCIs in all 3 vessels with DES. Last PCI 2005 with overlapping Cypher stents in CFX bifurcation lesion  . Paroxysmal atrial fibrillation   . HTN (hypertension)     not under good control  . Hyperlipidemia   . Sleep apnea   . Peptic ulcer disease     PAST SURGICAL HISTORY:   Past Surgical History  Procedure Laterality Date  . Thyroglossal duct cyst removed in jan or feb 2005    . Bilateral carpal tunnel surgery    . Cardiac catheterization      x4-5, PCIs  . Turp vaporization      SOCIAL HISTORY:   History   Social History  . Marital Status: Married    Spouse Name: N/A  . Number of Children: N/A  . Years of Education: N/A   Occupational History  . Not on file.   Social History Main Topics  . Smoking status: Former Smoker    Types: Cigarettes    Quit date: 10/03/1983  . Smokeless tobacco: Never Used  . Alcohol Use: No  . Drug Use: No  . Sexual Activity: Not on file   Other Topics Concern  . Not on file   Social History Narrative    FAMILY HISTORY:   Family Status  Relation Status Death Age  . Father Deceased     MI  . Mother Deceased     diabetes  . Brother Deceased     MI    ROS:  A complete 10 system review of systems was obtained and was unremarkable apart from what is mentioned above.  PHYSICAL EXAMINATION:    VITALS:   Filed Vitals:   07/03/14 1347  BP: 110/78  Pulse: 84    GEN:  The patient appears stated age and is in NAD. HEENT:  Normocephalic, atraumatic.  The mucous membranes are moist. The superficial temporal arteries are without ropiness or tenderness. CV:  irreg irreg Lungs:  CTAB Neck/HEME:  There are no carotid bruits bilaterally.  Neurological  examination:  Orientation: He is oriented to place and person but not time. Cranial nerves: There is good facial symmetry.There is fluctuating ptosis on the left and he intermittently closes the left eye.   Extraocular muscles are intact. The visual fields are full to confrontational testing. The speech is fluent and clear.  He is hypophonic.  Soft  palate rises symmetrically and there is no tongue deviation. Hearing is intact to conversational tone. Sensation: Sensation is intact to light and pinprick throughout (facial, trunk, extremities). Vibration is decreased at the bilateral big toe. There is no extinction with double simultaneous stimulation. There is no sensory dermatomal level identified. Motor: Strength is 5/5 in the bilateral upper and lower extremities.   Shoulder shrug is equal and symmetric.  The patient does not want to attempt pronator drift.  He states that this causes pain as he had recent rib fractures.  Movement examination: Tone: There is normal tone bilaterally, upper and lower extremities. Abnormal movements: There is a left upper extremity resting tremor.  This is mild and intermittent. Coordination:  There is no significant decremation with rapid alternating movements today. Gait and Station: The patient has difficulty arising out of a deep-seated chair without the use of the hands and actually requires assistance to get out of the chair. The patient's stride length is decreased, but he ambulates without a walker today.  He has postural instability.  Labs: I do lab work from his primary care physician from 04/28/2014.  His white blood cell count at that time was slightly elevated at 13.7.  Hemoglobin and hematocrit were normal.  His BUN was 18 and creatinine 0.88.  His glucose was 117.  His CPK was 90.  Lab Results  Component Value Date   TSH 0.737 05/15/2014   Lab Results  Component Value Date   VITAMINB12 607 05/15/2014     ASSESSMENT/PLAN:  1.  Parkinsonism.  I  suspect that this does represent idiopathic Parkinson's disease.  The patient has tremor, bradykinesia, rigidity and postural instability.  -The carbidopa/levodopa that we started last visit slowly may have aggravated baseline dementia as he is having more hallucinations.  My bigger concern is that his wife is complaining about both visual and auditory hallucinations, and auditory hallucinations are very uncommon with Parkinson's disease and almost unheard of as a side effect of medication.  I wonder if this is not a progression of his dementia.  Nonetheless, I will hold the medication for a week and I am also going to check lab work to include a CBC, chemistry, urinalysis.  -If his cognition is much better off the medication, then perhaps if we restart anything, it will be the CR version of the medication, which seems to have slight side effects, although it is arguably less efficacious.  If his cognition is not much better, then we probably need to consider something to help the hallucinations and confusion.  Perhaps we could start with something like Exelon and if that that did not help, then we did talk about Seroquel.  -I encouraged him to get back to physical therapy. 2. Memory change/Dementia  -Recommendations, as above. 3.  Fluctuating Ptosis  -Patient's wife states that this has been going on ever since a surgery for a herpetic infection in the R eye on the left.  It is odd that this would be fluctuating ptosis if it is structural, but she states it has been going on for years.  His myasthenia labs were negative. 4.  We will call him in one week and see how he is doing.

## 2014-07-03 NOTE — Patient Instructions (Signed)
1. Your provider has requested that you have labwork completed today. Please go to [facility] on the first floor of this building before leaving the office today. 2. Stop Levodopa for one week. We will talk next Monday to see how you are doing.

## 2014-07-04 ENCOUNTER — Other Ambulatory Visit: Payer: Self-pay | Admitting: Geriatric Medicine

## 2014-07-04 ENCOUNTER — Telehealth: Payer: Self-pay | Admitting: Neurology

## 2014-07-04 ENCOUNTER — Ambulatory Visit
Admission: RE | Admit: 2014-07-04 | Discharge: 2014-07-04 | Disposition: A | Discharge status: Home / Self Care | Payer: Medicare Other | Source: Ambulatory Visit | Attending: Geriatric Medicine | Admitting: Geriatric Medicine

## 2014-07-04 DIAGNOSIS — S2232XA Fracture of one rib, left side, initial encounter for closed fracture: Secondary | ICD-10-CM

## 2014-07-04 LAB — URINALYSIS, ROUTINE W REFLEX MICROSCOPIC
Bilirubin Urine: NEGATIVE
GLUCOSE, UA: NEGATIVE mg/dL
Hgb urine dipstick: NEGATIVE
Ketones, ur: NEGATIVE mg/dL
Leukocytes, UA: NEGATIVE
NITRITE: NEGATIVE
Protein, ur: NEGATIVE mg/dL
Specific Gravity, Urine: 1.009 (ref 1.005–1.030)
UROBILINOGEN UA: 1 mg/dL (ref 0.0–1.0)
pH: 6 (ref 5.0–8.0)

## 2014-07-04 LAB — URINE CULTURE

## 2014-07-04 NOTE — Telephone Encounter (Signed)
Patient's wife made aware labs normal.

## 2014-07-04 NOTE — Telephone Encounter (Signed)
Left message on machine for patient to call back. To make them aware labs okay. Awaiting call back.

## 2014-07-04 NOTE — Telephone Encounter (Signed)
-----   Message from Octaviano Battyebecca S Tat, DO sent at 07/04/2014  2:46 PM EDT ----- Let pt/wife know that labs looked ok

## 2014-07-10 ENCOUNTER — Telehealth: Payer: Self-pay | Admitting: Neurology

## 2014-07-10 NOTE — Telephone Encounter (Signed)
Tried to call patient to see if hallucinations are better after stopping Levodopa. Home number goes straight to voicemail and mailbox is full. No answer on cell number and no option to leave message.

## 2014-07-10 NOTE — Telephone Encounter (Signed)
Tried to call again with same results

## 2014-07-11 NOTE — Telephone Encounter (Signed)
Tried to call again with no answer and no way to leave message.  

## 2014-07-12 NOTE — Telephone Encounter (Signed)
Unable to reach patient. Letter mailed.

## 2014-07-17 ENCOUNTER — Telehealth: Payer: Self-pay | Admitting: *Deleted

## 2014-07-17 NOTE — Telephone Encounter (Signed)
i guess send him a letter again telling him to call us if he wants to try the extended release version instead.

## 2014-07-17 NOTE — Telephone Encounter (Signed)
Patient doing great since he stopping his levodopa   Call back number [phone]

## 2014-07-17 NOTE — Telephone Encounter (Signed)
Tried to call patient back at the number provided in message. Number does not work. Numbers provided in patient's chart do not work either. Tried to reach patient last week and had to mail letter. No way of contacting him.

## 2014-07-17 NOTE — Telephone Encounter (Signed)
Letter mailed to patient.

## 2014-07-17 NOTE — Telephone Encounter (Signed)
Does that mean hallucinations went away?  Glad better.  Does he wish to try the CR version to see if better/less side effects (see note)

## 2014-07-20 NOTE — Progress Notes (Signed)
No chief complaint on file.   History of Present Illness: 81 yo male with history of CAD, atrial fibrillation here today for cardiac follow up. He has had multiple PCI procedures and has stents in all 3 vessels. His last PCI was in 2006 at which time he had overlapping stents placed in the circumflex artery. He also has a history of paroxysmal atrial fibrillation which is managed with rate control and anticoagulation with Xarelto. Lower extremity arterial studies June 2011 with ABI of 1.3 bilaterally.   He is here today for follow up. He denies chest pain, shortness of breath or palpitations. No bleeding issues.  He did fall last week and strained his back but no other falls.   Primary Care Physician: Dr. Pete GlatterStoneking  Past Medical History  Diagnosis Date  . DM (diabetes mellitus), type 2   . Spinal stenosis   . Glaucoma   . Gout   . Macular degeneration   . Obesity   . Osteoporosis   . CAD (coronary artery disease)     s/p multiple PCIs in all 3 vessels with DES. Last PCI 2005 with overlapping Cypher stents in CFX bifurcation lesion  . Paroxysmal atrial fibrillation   . HTN (hypertension)     not under good control  . Hyperlipidemia   . Sleep apnea   . Peptic ulcer disease     Past Surgical History  Procedure Laterality Date  . Thyroglossal duct cyst removed in jan or feb 2005    . Bilateral carpal tunnel surgery    . Cardiac catheterization      x4-5, PCIs  . Turp vaporization      Current Outpatient Prescriptions  Medication Sig Dispense Refill  . acyclovir (ZOVIRAX) 400 MG tablet Take 400 mg by mouth 2 (two) times daily.     Marland Kitchen. albuterol (PROVENTIL) (5 MG/ML) 0.5% nebulizer solution Take 2.5 mg by nebulization every 4 (four) hours as needed. For shortness of breath    . amLODipine (NORVASC) 10 MG tablet Take 1 tablet (10 mg total) by mouth daily. 90 tablet 3  . atenolol (TENORMIN) 100 MG tablet Take 1 tablet (100 mg total) by mouth daily. 90 tablet 3  . calcium-vitamin  D (OSCAL 500/200 D-3) 500-200 MG-UNIT per tablet Take 1 tablet by mouth 2 (two) times daily with a meal.      . gemfibrozil (LOPID) 600 MG tablet Take 1 tablet (600 mg total) by mouth 2 (two) times daily before a meal. 60 tablet 8  . indapamide (LOZOL) 2.5 MG tablet TAKE 1 TABLET BY MOUTH 2 TIMES DAILY. 60 tablet 5  . insulin glargine (LANTUS) 100 UNIT/ML injection Inject 12 Units into the skin at bedtime.    Marland Kitchen. lisinopril (PRINIVIL,ZESTRIL) 40 MG tablet Take 1 tablet (40 mg total) by mouth daily. 90 tablet 3  . metFORMIN (GLUCOPHAGE) 500 MG tablet Take 500 mg by mouth 2 (two) times daily with a meal.      . Multiple Vitamin (MULITIVITAMIN WITH MINERALS) TABS Take 1 tablet by mouth daily. Preservision/lutein    . nitroGLYCERIN (NITROSTAT) 0.4 MG SL tablet Place 1 tablet (0.4 mg total) under the tongue every 5 (five) minutes as needed. For chest pain 25 tablet 11  . penicillin v potassium (VEETID) 500 MG tablet Take 1 tablet (500 mg total) by mouth 3 (three) times daily. 90 tablet 1  . rivaroxaban (XARELTO) 20 MG TABS tablet Take 1 tablet (20 mg total) by mouth daily with supper. 90 tablet 3  .  sertraline (ZOLOFT) 100 MG tablet Take 100 mg by mouth daily.    . traMADol (ULTRAM) 50 MG tablet Take 50 mg by mouth every 6 (six) hours as needed for severe pain.      No current facility-administered medications for this visit.    Allergies  Allergen Reactions  . Contrast Media [Iodinated Diagnostic Agents] Other (See Comments)    Reaction when stints put in    History   Social History  . Marital Status: Married    Spouse Name: N/A  . Number of Children: N/A  . Years of Education: N/A   Occupational History  . Not on file.   Social History Main Topics  . Smoking status: Former Smoker    Types: Cigarettes    Quit date: 10/03/1983  . Smokeless tobacco: Never Used  . Alcohol Use: No  . Drug Use: No  . Sexual Activity: Not on file   Other Topics Concern  . Not on file   Social History  Narrative    Family History  Problem Relation Age of Onset  . Diabetes Mother     deceased 6763  . Heart attack Father     deceased 2765  . Heart attack Brother     deceased 2665    Review of Systems:  As stated in the HPI and otherwise negative.   BP 110/70 mmHg  Pulse 77  Ht 5\' 8"  (1.727 m)  Wt 150 lb (68.04 kg)  BMI 22.81 kg/m2  Physical Examination: General: Well developed, well nourished, NAD HEENT: OP clear, mucus membranes moist SKIN: warm, dry. No rashes. Neuro: No focal deficits Musculoskeletal: Muscle strength 5/5 all ext Psychiatric: Mood and affect normal Neck: No JVD, no carotid bruits, no thyromegaly, no lymphadenopathy. Lungs:Clear bilaterally, no wheezes, rhonci, crackles Cardiovascular: Irregular No murmurs, gallops or rubs. Abdomen:Soft. Bowel sounds present. Non-tender.  Extremities: No lower extremity edema. Pulses are 2 + in the bilateral DP/PT  EKG:  EKG is ordered today. The ekg ordered today demonstrates atrial fib with rate 77 bpm, RBBB  Recent Labs: 05/15/2014: TSH 0.737 07/03/2014: ALT <8; BUN 17; Creatinine 0.85; Hemoglobin 14.1; Platelets 359; Potassium 4.2; Sodium 140   Lipid Panel    Component Value Date/Time   CHOL 122 10/08/2011 0849   TRIG 143.0 10/08/2011 0849   HDL 42.90 10/08/2011 0849   CHOLHDL 3 10/08/2011 0849   VLDL 28.6 10/08/2011 0849   LDLCALC 51 10/08/2011 0849     Wt Readings from Last 3 Encounters:  07/21/14 150 lb (68.04 kg)  05/15/14 154 lb (69.854 kg)  08/30/13 165 lb (74.844 kg)     Other studies Reviewed: Additional studies/ records that were reviewed today include:  Review of the above records demonstrates:   Assessment and Plan:   1. CAD: Stable. Continue current therapy. No changes today.   2. Atrial fibrillation: Rate controlled. Continue Xarelto for anti-coagulation.   3. HYPERLIPIDEMIA: Lipids well controlled per pt in primary care. Continue statin.   4. HTN: BP well controlled here today and at  home. Continue Atenolol, Norvasc and Lisinopril.   Current medicines are reviewed at length with the patient today.  The patient does not have concerns regarding medicines.  The following changes have been made:  no change  Labs/ tests ordered today include:   Orders Placed This Encounter  Procedures  . EKG 12-Lead    Disposition:   FU with me in 12 months  Signed, Verne Carrowhristopher McAlhany, MD 07/21/2014 10:40 AM    [facility] [address], [place], [place]  [address] Phone: [phone]; Fax: [phone]

## 2014-07-21 ENCOUNTER — Ambulatory Visit (INDEPENDENT_AMBULATORY_CARE_PROVIDER_SITE_OTHER): Payer: Medicare Other | Admitting: Cardiovascular Disease

## 2014-07-21 ENCOUNTER — Encounter: Payer: Self-pay | Admitting: Cardiovascular Disease

## 2014-07-21 VITALS — BP 110/70 | HR 77 | Ht 68.0 in | Wt 150.0 lb

## 2014-07-21 DIAGNOSIS — E785 Hyperlipidemia, unspecified: Secondary | ICD-10-CM

## 2014-07-21 DIAGNOSIS — I482 Chronic atrial fibrillation, unspecified: Secondary | ICD-10-CM

## 2014-07-21 DIAGNOSIS — I2510 Atherosclerotic heart disease of native coronary artery without angina pectoris: Secondary | ICD-10-CM | POA: Diagnosis not present

## 2014-07-21 DIAGNOSIS — I10 Essential (primary) hypertension: Secondary | ICD-10-CM | POA: Diagnosis not present

## 2014-07-21 NOTE — Patient Instructions (Signed)
Medication Instructions:  Your physician recommends that you continue on your current medications as directed. Please refer to the Current Medication list given to you today.   Labwork: none  Testing/Procedures: none  Follow-Up: Your physician wants you to follow-up in:  12 months.  You will receive a reminder letter in the mail two months in advance. If you don't receive a letter, please call our office to schedule the follow-up appointment.        

## 2014-08-09 ENCOUNTER — Other Ambulatory Visit: Payer: Self-pay | Admitting: Cardiovascular Disease

## 2014-08-15 ENCOUNTER — Encounter: Payer: Self-pay | Admitting: Neurology

## 2014-08-15 ENCOUNTER — Ambulatory Visit (INDEPENDENT_AMBULATORY_CARE_PROVIDER_SITE_OTHER): Payer: Medicare Other | Admitting: Neurology

## 2014-08-15 VITALS — BP 128/70 | HR 72

## 2014-08-15 DIAGNOSIS — F0280 Dementia in other diseases classified elsewhere without behavioral disturbance: Secondary | ICD-10-CM | POA: Diagnosis not present

## 2014-08-15 DIAGNOSIS — G20 Parkinson's disease: Secondary | ICD-10-CM | POA: Diagnosis not present

## 2014-08-15 DIAGNOSIS — R443 Hallucinations, unspecified: Secondary | ICD-10-CM | POA: Diagnosis not present

## 2014-08-15 MED ORDER — CARBIDOPA-LEVODOPA ER 25-100 MG PO TBCR: 1.0000 | EXTENDED_RELEASE_TABLET | Freq: Three times a day (TID) | ORAL | Status: DC

## 2014-08-15 NOTE — Patient Instructions (Signed)
1.  Start carbidopa/levodopa 25/100 CR and take twice a day 30 minutes before breakfast and dinner.  Do this for a week and then increase to three times a day 30 minutes before breakfast, lunch and dinner.  2.  We will send a referral for PT at [facility].

## 2014-08-15 NOTE — Progress Notes (Signed)
Note routed to Dr Stoneking.  

## 2014-08-15 NOTE — Progress Notes (Signed)
Darryl Carroll was seen today in the movement disorders clinic for neurologic consultation at the request of Ginette Otto, MD.  The consultation is for the evaluation of gait changes and to r/o PD.  He is accompanied by his wife who supplements the history.   Pts wife states that he has fallen twice and "fx his spine."  Falls go back at least 6-7 years.  He has not fallen in the last few months, but sometimes he will fall several times in a week.  He lives at [facility] and does PT there 4 times per week.   CT of the brain performed last in February, 2013.  MRI of the brain performed last in June, 2012.  I tried to load the MRI of the brain, but the system did not find the examination, only the report.  It reported global atrophy, ventricular prominence that could be related to atrophy.  I also tried to load the CT of the brain, but again was not able to view the images and was only able to view the report, which demonstrated atrophy and chronic white matter disease along with basal ganglia calcifications.  07/02/24 update:  This patient is accompanied in the office by his spouse who supplements the history.  The patient was diagnosed with Parkinson's disease last visit and started on levodopa on 05/15/2014.  His wife called me on April 22 to state that the patient was having hallucinations, which seem to correlate with when he started medication.  They are both visual and auditory.   His wife states that he is more off balance.  Also, when he gets in the bed, he will sit down and lay across the bed instead of turning to lie vertical in the bed.  His wife states that he has had some of this in the past, but not this bad.  Having normal bowel movements and good bladder control.  His tremor is better with the medicine.  He was doing PT at [facility] but he quit going.    08/15/14 update:  This patient is accompanied in the office by his spouse who supplements the history.  Held carbidopa/levodopa  25/100 last visit due to hallucinations increasing and has done better in that regard.  He/wife are difficult to get a hold of on the phone.  Was going to try and change to the CR version over the phone but no response from them.  Therefore, he is currently off of PD meds.  Pts wife states that he isn't having hallucinations, but pt admits that he is still having some visual hallucinations but not as often.  He has had no falls.  He has a transport chair and uses that. Tremor seems a bit better. He has some lightheadedness.  No formal exercise.      PREVIOUS MEDICATIONS: none to date  ALLERGIES:   Allergies  Allergen Reactions  . Contrast Media [Iodinated Diagnostic Agents] Other (See Comments)    Reaction when stints put in    CURRENT MEDICATIONS:  Outpatient Encounter Prescriptions as of 08/15/2014  Medication Sig  . acyclovir (ZOVIRAX) 400 MG tablet Take 400 mg by mouth 2 (two) times daily.   Marland Kitchen albuterol (PROVENTIL) (5 MG/ML) 0.5% nebulizer solution Take 2.5 mg by nebulization every 4 (four) hours as needed. For shortness of breath  . amLODipine (NORVASC) 10 MG tablet Take 1 tablet (10 mg total) by mouth daily.  Marland Kitchen atenolol (TENORMIN) 100 MG tablet Take 1 tablet (100 mg total) by  mouth daily.  . calcium-vitamin D (OSCAL 500/200 D-3) 500-200 MG-UNIT per tablet Take 1 tablet by mouth 2 (two) times daily with a meal.    . gemfibrozil (LOPID) 600 MG tablet Take 1 tablet (600 mg total) by mouth 2 (two) times daily before a meal.  . indapamide (LOZOL) 2.5 MG tablet TAKE 1 TABLET BY MOUTH 2 TIMES DAILY.  Marland Kitchen. insulin glargine (LANTUS) 100 UNIT/ML injection Inject 12 Units into the skin at bedtime.  Marland Kitchen. lisinopril (PRINIVIL,ZESTRIL) 40 MG tablet Take 1 tablet (40 mg total) by mouth daily.  . metFORMIN (GLUCOPHAGE) 500 MG tablet Take 500 mg by mouth 2 (two) times daily with a meal.    . Multiple Vitamin (MULITIVITAMIN WITH MINERALS) TABS Take 1 tablet by mouth daily. Preservision/lutein  . penicillin v  potassium (VEETID) 500 MG tablet Take 1 tablet (500 mg total) by mouth 3 (three) times daily.  . rivaroxaban (XARELTO) 20 MG TABS tablet Take 1 tablet (20 mg total) by mouth daily with supper.  . sertraline (ZOLOFT) 100 MG tablet Take 100 mg by mouth daily.  . traMADol (ULTRAM) 50 MG tablet Take 50 mg by mouth every 6 (six) hours as needed for severe pain.   . nitroGLYCERIN (NITROSTAT) 0.4 MG SL tablet Place 1 tablet (0.4 mg total) under the tongue every 5 (five) minutes as needed. For chest pain (Patient not taking: Reported on 08/15/2014)   No facility-administered encounter medications on file as of 08/15/2014.    PAST MEDICAL HISTORY:   Past Medical History  Diagnosis Date  . DM (diabetes mellitus), type 2   . Spinal stenosis   . Glaucoma   . Gout   . Macular degeneration   . Obesity   . Osteoporosis   . CAD (coronary artery disease)     s/p multiple PCIs in all 3 vessels with DES. Last PCI 2005 with overlapping Cypher stents in CFX bifurcation lesion  . Paroxysmal atrial fibrillation   . HTN (hypertension)     not under good control  . Hyperlipidemia   . Sleep apnea   . Peptic ulcer disease     PAST SURGICAL HISTORY:   Past Surgical History  Procedure Laterality Date  . Thyroglossal duct cyst removed in jan or feb 2005    . Bilateral carpal tunnel surgery    . Cardiac catheterization      x4-5, PCIs  . Turp vaporization      SOCIAL HISTORY:   History   Social History  . Marital Status: Married    Spouse Name: N/A  . Number of Children: N/A  . Years of Education: N/A   Occupational History  . Not on file.   Social History Main Topics  . Smoking status: Former Smoker    Types: Cigarettes    Quit date: 10/03/1983  . Smokeless tobacco: Never Used  . Alcohol Use: No  . Drug Use: No  . Sexual Activity: Not on file   Other Topics Concern  . Not on file   Social History Narrative    FAMILY HISTORY:   Family Status  Relation Status Death Age  . Father  Deceased     MI  . Mother Deceased     diabetes  . Brother Deceased     MI    ROS:  A complete 10 system review of systems was obtained and was unremarkable apart from what is mentioned above.  PHYSICAL EXAMINATION:    VITALS:   Filed Vitals:   08/15/14 1318  BP: 128/70  Pulse: 72    GEN:  The patient appears stated age and is in NAD. HEENT:  Normocephalic, atraumatic.  The mucous membranes are moist. The superficial temporal arteries are without ropiness or tenderness. CV:  irreg irreg Lungs:  CTAB Neck/HEME:  There are no carotid bruits bilaterally.  Neurological examination:  Orientation: He is oriented to place and person but not time. Cranial nerves: There is good facial symmetry.There is fluctuating ptosis on the left and he intermittently closes the left eye.   Extraocular muscles are intact. The visual fields are full to confrontational testing. The speech is fluent and clear.  He is hypophonic.  Soft palate rises symmetrically and there is no tongue deviation. Hearing is intact to conversational tone. Sensation: Sensation is intact to light touch throughout. Motor: Strength is 5/5 in the bilateral upper and lower extremities.     Movement examination: Tone: There is normal tone bilaterally, upper and lower extremities. Abnormal movements: There is a left upper extremity resting tremor.  This is rare and intermittent. Coordination:  There is no decremation on the L with finger taps and toe taps Gait and Station: The patient has difficulty arising out of a deep-seated chair without the use of the hands and requires walker to get up. The patient's stride length is decreased and he drags the L leg  Labs: I do lab work from his primary care physician from 04/28/2014.  His white blood cell count at that time was slightly elevated at 13.7.  Hemoglobin and hematocrit were normal.  His BUN was 18 and creatinine 0.88.  His glucose was 117.  His CPK was 90.  Lab Results    Component Value Date   TSH 0.737 05/15/2014   Lab Results  Component Value Date   VITAMINB12 607 05/15/2014   Lab Results  Component Value Date   WBC 10.0 07/03/2014   HGB 14.1 07/03/2014   HCT 41.4 07/03/2014   MCV 85.0 07/03/2014   PLT 359 07/03/2014     ASSESSMENT/PLAN:  1.  Parkinsonism.  I suspect that this does represent idiopathic Parkinson's disease.  The patient has tremor, bradykinesia, rigidity and postural instability.  -The carbidopa/levodopa that we started previously increased hallucinations.  Talked to them about options and decided to try the CR version of the medication, which seems to have slight side effects, although it is arguably less efficacious.  They are to call me if hallucinations increase  -I encouraged him to get back to physical therapy and was agreeable today to PT at [facility].  Will send referral 2. Memory change/Dementia  -Don't want to add further medication right now.  Has 24 hour per day care.  Safety discussed for >50% of 25 min visit. 3.  Fluctuating Ptosis  -Patient's wife states that this has been going on ever since a surgery for a herpetic infection.  It is odd that this would be fluctuating ptosis if it is structural, but she states it has been going on for years.  His myasthenia labs were negative. 4.  Return in about 4 months (around 12/15/2014).

## 2014-08-16 ENCOUNTER — Telehealth: Payer: Self-pay | Admitting: Neurology

## 2014-08-16 NOTE — Telephone Encounter (Signed)
[facility] at [phone] with confirmation received.

## 2014-08-29 ENCOUNTER — Other Ambulatory Visit: Payer: Self-pay

## 2014-08-29 DIAGNOSIS — I482 Chronic atrial fibrillation, unspecified: Secondary | ICD-10-CM

## 2014-08-29 MED ORDER — RIVAROXABAN 20 MG PO TABS: 20.0000 mg | ORAL_TABLET | Freq: Every day | ORAL | Status: DC

## 2014-09-21 ENCOUNTER — Other Ambulatory Visit: Payer: Self-pay | Admitting: Geriatric Medicine

## 2014-09-21 DIAGNOSIS — R269 Unspecified abnormalities of gait and mobility: Secondary | ICD-10-CM

## 2014-09-21 DIAGNOSIS — W19XXXA Unspecified fall, initial encounter: Secondary | ICD-10-CM

## 2014-09-22 ENCOUNTER — Ambulatory Visit
Admission: RE | Admit: 2014-09-22 | Discharge: 2014-09-22 | Disposition: A | Discharge status: Home / Self Care | Payer: Medicare Other | Source: Ambulatory Visit | Attending: Geriatric Medicine | Admitting: Geriatric Medicine

## 2014-09-22 DIAGNOSIS — R269 Unspecified abnormalities of gait and mobility: Secondary | ICD-10-CM

## 2014-09-22 DIAGNOSIS — W19XXXA Unspecified fall, initial encounter: Secondary | ICD-10-CM

## 2014-09-25 ENCOUNTER — Telehealth: Payer: Self-pay | Admitting: Neurology

## 2014-09-25 NOTE — Telephone Encounter (Signed)
Ann from [facility] called in regards to pt's medication Sinemet and needs to know what the dosage is/Dawn CB# [phone]

## 2014-09-25 NOTE — Telephone Encounter (Signed)
Spoke with Dewayne HatchAnn and made her aware patient to be on Carbidopa Levodopa 25/100 CR - 1 tablet 3 times daily.

## 2014-10-03 ENCOUNTER — Other Ambulatory Visit: Payer: Self-pay | Admitting: Geriatric Medicine

## 2014-10-03 ENCOUNTER — Ambulatory Visit
Admission: RE | Admit: 2014-10-03 | Discharge: 2014-10-03 | Disposition: A | Discharge status: Home / Self Care | Payer: Medicare Other | Source: Ambulatory Visit | Attending: Geriatric Medicine | Admitting: Geriatric Medicine

## 2014-10-03 DIAGNOSIS — R0781 Pleurodynia: Secondary | ICD-10-CM

## 2014-10-05 ENCOUNTER — Other Ambulatory Visit: Payer: Self-pay | Admitting: Cardiovascular Disease

## 2014-10-23 ENCOUNTER — Emergency Department (HOSPITAL_COMMUNITY): Payer: Medicare Other

## 2014-10-23 ENCOUNTER — Emergency Department (HOSPITAL_COMMUNITY)
Admission: EM | Admit: 2014-10-23 | Discharge: 2014-10-23 | Disposition: A | Discharge status: Home / Self Care | Payer: Medicare Other | Attending: Emergency Medicine | Admitting: Emergency Medicine

## 2014-10-23 ENCOUNTER — Encounter (HOSPITAL_COMMUNITY): Payer: Self-pay | Admitting: *Deleted

## 2014-10-23 DIAGNOSIS — R05 Cough: Secondary | ICD-10-CM | POA: Insufficient documentation

## 2014-10-23 DIAGNOSIS — Z794 Long term (current) use of insulin: Secondary | ICD-10-CM | POA: Insufficient documentation

## 2014-10-23 DIAGNOSIS — Z79899 Other long term (current) drug therapy: Secondary | ICD-10-CM | POA: Insufficient documentation

## 2014-10-23 DIAGNOSIS — R9431 Abnormal electrocardiogram [ECG] [EKG]: Secondary | ICD-10-CM | POA: Insufficient documentation

## 2014-10-23 DIAGNOSIS — E669 Obesity, unspecified: Secondary | ICD-10-CM | POA: Diagnosis not present

## 2014-10-23 DIAGNOSIS — R630 Anorexia: Secondary | ICD-10-CM | POA: Diagnosis not present

## 2014-10-23 DIAGNOSIS — Z87891 Personal history of nicotine dependence: Secondary | ICD-10-CM | POA: Diagnosis not present

## 2014-10-23 DIAGNOSIS — Z8711 Personal history of peptic ulcer disease: Secondary | ICD-10-CM | POA: Diagnosis not present

## 2014-10-23 DIAGNOSIS — E1165 Type 2 diabetes mellitus with hyperglycemia: Secondary | ICD-10-CM | POA: Insufficient documentation

## 2014-10-23 DIAGNOSIS — I2510 Atherosclerotic heart disease of native coronary artery without angina pectoris: Secondary | ICD-10-CM | POA: Insufficient documentation

## 2014-10-23 DIAGNOSIS — Z8669 Personal history of other diseases of the nervous system and sense organs: Secondary | ICD-10-CM | POA: Insufficient documentation

## 2014-10-23 DIAGNOSIS — Z7901 Long term (current) use of anticoagulants: Secondary | ICD-10-CM | POA: Insufficient documentation

## 2014-10-23 DIAGNOSIS — I10 Essential (primary) hypertension: Secondary | ICD-10-CM | POA: Diagnosis not present

## 2014-10-23 DIAGNOSIS — I480 Paroxysmal atrial fibrillation: Secondary | ICD-10-CM | POA: Insufficient documentation

## 2014-10-23 DIAGNOSIS — M810 Age-related osteoporosis without current pathological fracture: Secondary | ICD-10-CM | POA: Insufficient documentation

## 2014-10-23 DIAGNOSIS — R739 Hyperglycemia, unspecified: Secondary | ICD-10-CM

## 2014-10-23 LAB — CBC WITH DIFFERENTIAL/PLATELET
BASOS ABS: 0 10*3/uL (ref 0.0–0.1)
Basophils Relative: 0 % (ref 0–1)
Eosinophils Absolute: 0 10*3/uL (ref 0.0–0.7)
Eosinophils Relative: 0 % (ref 0–5)
HEMATOCRIT: 41.6 % (ref 39.0–52.0)
Hemoglobin: 14.2 g/dL (ref 13.0–17.0)
LYMPHS PCT: 9 % — AB (ref 12–46)
Lymphs Abs: 1.4 10*3/uL (ref 0.7–4.0)
MCH: 30.6 pg (ref 26.0–34.0)
MCHC: 34.1 g/dL (ref 30.0–36.0)
MCV: 89.7 fL (ref 78.0–100.0)
MONO ABS: 1.4 10*3/uL — AB (ref 0.1–1.0)
MONOS PCT: 9 % (ref 3–12)
NEUTROS ABS: 12.6 10*3/uL — AB (ref 1.7–7.7)
Neutrophils Relative %: 82 % — ABNORMAL HIGH (ref 43–77)
Platelets: 228 10*3/uL (ref 150–400)
RBC: 4.64 MIL/uL (ref 4.22–5.81)
RDW: 14.6 % (ref 11.5–15.5)
WBC: 15.4 10*3/uL — ABNORMAL HIGH (ref 4.0–10.5)

## 2014-10-23 LAB — CBG MONITORING, ED
Glucose-Capillary: 340 mg/dL — ABNORMAL HIGH (ref 65–99)
Glucose-Capillary: 137 mg/dL — ABNORMAL HIGH (ref 65–99)

## 2014-10-23 LAB — URINALYSIS, ROUTINE W REFLEX MICROSCOPIC
Bilirubin Urine: NEGATIVE
Glucose, UA: 500 mg/dL — AB
Hgb urine dipstick: NEGATIVE
KETONES UR: NEGATIVE mg/dL
LEUKOCYTES UA: NEGATIVE
NITRITE: NEGATIVE
Protein, ur: 100 mg/dL — AB
Specific Gravity, Urine: 1.023 (ref 1.005–1.030)
Urobilinogen, UA: 0.2 mg/dL (ref 0.0–1.0)
pH: 6 (ref 5.0–8.0)

## 2014-10-23 LAB — URINE MICROSCOPIC-ADD ON

## 2014-10-23 LAB — BASIC METABOLIC PANEL
Anion gap: 10 (ref 5–15)
BUN: 23 mg/dL — ABNORMAL HIGH (ref 6–20)
CO2: 30 mmol/L (ref 22–32)
Calcium: 9.3 mg/dL (ref 8.9–10.3)
Chloride: 102 mmol/L (ref 101–111)
Creatinine, Ser: 0.85 mg/dL (ref 0.61–1.24)
GFR calc Af Amer: >60 mL/min (ref >60)
GFR calc non Af Amer: >60 mL/min (ref >60)
Glucose, Bld: 312 mg/dL — ABNORMAL HIGH (ref 65–99)
POTASSIUM: 3.4 mmol/L — AB (ref 3.5–5.1)
Sodium: 142 mmol/L (ref 135–145)

## 2014-10-23 LAB — I-STAT CG4 LACTIC ACID, ED: LACTIC ACID, VENOUS: 1.67 mmol/L (ref 0.5–2.0)

## 2014-10-23 LAB — TROPONIN I: Troponin I: <0.03 ng/mL (ref <0.031)

## 2014-10-23 LAB — I-STAT TROPONIN, ED: Troponin i, poc: 0.01 ng/mL (ref 0.00–0.08)

## 2014-10-23 MED ORDER — SODIUM CHLORIDE 0.9 % IV BOLUS (SEPSIS)
1000.0000 mL | Freq: Once | INTRAVENOUS | Status: AC
  Administered 2014-10-23: 1000 mL via INTRAVENOUS

## 2014-10-23 NOTE — ED Provider Notes (Signed)
CSN: [id number]     Arrival date & time 10/23/14  1011 History   First MD Initiated Contact with Patient 10/23/14 1016     Chief Complaint  Patient presents with  . Hyperglycemia   Darryl Carroll is a 81 y.o. male with a history of diabetes, atrial fibrillation, coronary artery disease, hypertension and hyperlipidemia who presents to the emergency department from independent living complaining of generalized weakness for the past 9 weeks. Patient also reports cough for the past 2 weeks with productive sputum. The patient reports he's been taking his medications at home as prescribed. He reports decreased appetite. According to family the patient is unable to ambulate at baseline. Wife and staff reports that his mentation is at baseline. Patient has a history of a- fibrillation and is on Xarelto. The patient denies fevers, chills, chest pain, shortness of breath, abdominal pain, nausea, vomiting, urinary symptoms, rashes, or focal weakness.   (Consider location/radiation/quality/duration/timing/severity/associated sxs/prior Treatment) HPI  Past Medical History  Diagnosis Date  . DM (diabetes mellitus), type 2   . Spinal stenosis   . Glaucoma   . Gout   . Macular degeneration   . Obesity   . Osteoporosis   . CAD (coronary artery disease)     s/p multiple PCIs in all 3 vessels with DES. Last PCI 2005 with overlapping Cypher stents in CFX bifurcation lesion  . Paroxysmal atrial fibrillation   . HTN (hypertension)     not under good control  . Hyperlipidemia   . Sleep apnea   . Peptic ulcer disease    Past Surgical History  Procedure Laterality Date  . Thyroglossal duct cyst removed in jan or feb 2005    . Bilateral carpal tunnel surgery    . Cardiac catheterization      x4-5, PCIs  . Turp vaporization     Family History  Problem Relation Age of Onset  . Diabetes Mother     deceased 65  . Heart attack Father     deceased 34  . Heart attack Brother     deceased 27   Social  History  Substance Use Topics  . Smoking status: Former Smoker    Types: Cigarettes    Quit date: 10/03/1983  . Smokeless tobacco: Never Used  . Alcohol Use: No    Review of Systems  Constitutional: Positive for appetite change and fatigue. Negative for fever and chills.  HENT: Negative for congestion and sore throat.   Eyes: Negative for visual disturbance.  Respiratory: Positive for cough. Negative for shortness of breath and wheezing.   Cardiovascular: Negative for chest pain, palpitations and leg swelling.  Gastrointestinal: Negative for nausea, vomiting, abdominal pain and diarrhea.  Genitourinary: Negative for dysuria, urgency, frequency and difficulty urinating.  Musculoskeletal: Negative for back pain and neck pain.  Skin: Negative for rash.  Neurological: Positive for weakness. Negative for syncope, speech difficulty, light-headedness, numbness and headaches.      Allergies  Contrast media  Home Medications   Prior to Admission medications   Medication Sig Start Date End Date Taking? Authorizing Provider  acyclovir (ZOVIRAX) 400 MG tablet Take 400 mg by mouth 2 (two) times daily.    Yes Historical Provider, MD  alendronate (FOSAMAX) 70 MG tablet Take 70 mg by mouth every Tuesday. Take with a full glass of water on an empty stomach.   Yes Historical Provider, MD  allopurinol (ZYLOPRIM) 300 MG tablet Take 300 mg by mouth daily with breakfast.   Yes Historical Provider,  MD  amLODipine (NORVASC) 10 MG tablet Take 1 tablet (10 mg total) by mouth daily. 08/30/13  Yes Kathleene Hazel, MD  atenolol (TENORMIN) 100 MG tablet Take 1 tablet (100 mg total) by mouth daily. Patient taking differently: Take 100 mg by mouth daily with breakfast.  08/30/13  Yes Kathleene Hazel, MD  calcium-vitamin D (OSCAL 500/200 D-3) 500-200 MG-UNIT per tablet Take 1 tablet by mouth 2 (two) times daily with a meal.     Yes Historical Provider, MD  indapamide (LOZOL) 2.5 MG tablet TAKE 1  TABLET BY MOUTH 2 TIMES DAILY. 08/10/14  Yes Kathleene Hazel, MD  insulin glargine (LANTUS) 100 UNIT/ML injection Inject 12 Units into the skin at bedtime.   Yes Historical Provider, MD  Multiple Vitamin (MULITIVITAMIN WITH MINERALS) TABS Take 1 tablet by mouth daily. Preservision/lutein   Yes Historical Provider, MD  Multiple Vitamins-Minerals (PRESERVISION/LUTEIN) CAPS Take 1 capsule by mouth 2 (two) times daily.   Yes Historical Provider, MD  nitroGLYCERIN (NITROSTAT) 0.4 MG SL tablet Place 1 tablet (0.4 mg total) under the tongue every 5 (five) minutes as needed. For chest pain 10/01/11  Yes Kathleene Hazel, MD  omeprazole (PRILOSEC) 20 MG capsule Take 20 mg by mouth daily.   Yes Historical Provider, MD  rivaroxaban (XARELTO) 20 MG TABS tablet Take 1 tablet (20 mg total) by mouth daily with supper. 08/29/14  Yes Kathleene Hazel, MD  traMADol (ULTRAM) 50 MG tablet Take 50 mg by mouth every 6 (six) hours as needed for severe pain.    Yes Historical Provider, MD  albuterol (PROVENTIL) (5 MG/ML) 0.5% nebulizer solution Take 2.5 mg by nebulization every 4 (four) hours as needed. For shortness of breath    Historical Provider, MD  Carbidopa-Levodopa ER (SINEMET CR) 25-100 MG tablet controlled release Take 1 tablet by mouth 3 (three) times daily. Patient not taking: Reported on 10/23/2014 08/15/14   Octaviano Batty Tat, DO  citalopram (CELEXA) 40 MG tablet Take 40 mg by mouth daily.    Historical Provider, MD  lisinopril (PRINIVIL,ZESTRIL) 20 MG tablet  10/06/14   Historical Provider, MD  sertraline (ZOLOFT) 100 MG tablet Take 100 mg by mouth daily.    Historical Provider, MD  TRAZODONE HCL PO Take 1 tablet by mouth every evening.    Historical Provider, MD   BP 159/134 mmHg  Pulse 109  Temp(Src) 97.5 F (36.4 C) (Oral)  Resp 18  SpO2 97% Physical Exam  Constitutional: He appears well-developed and well-nourished. No distress.  Nontoxic appearing.  HENT:  Head: Normocephalic and  atraumatic.  Right Ear: External ear normal.  Left Ear: External ear normal.  Mucous membranes are slightly dry.  Eyes: Conjunctivae are normal. Pupils are equal, round, and reactive to light. Right eye exhibits no discharge. Left eye exhibits no discharge.  Neck: Normal range of motion. Neck supple. No JVD present. No tracheal deviation present.  Cardiovascular: Normal rate, regular rhythm and intact distal pulses.  Exam reveals no gallop and no friction rub.   Slightly tachycardic at a rate of 116. Bilateral radial, and posterior tibialis pulses are intact.    Pulmonary/Chest: Effort normal. No respiratory distress. He has no wheezes. He has no rales.  Lung sounds slightly diminished in his right base. No respiratory distress. No coughing during interview. Patient speaking in full sentences.  Abdominal: Soft. He exhibits no distension. There is no tenderness. There is no guarding.  Abdomen soft and nontender to palpation.  Genitourinary: Penis normal.  No  rashes.   Musculoskeletal: He exhibits no edema or tenderness.  No lower extremity edema or tenderness. Patient appears generally weak and is unable to pull himself up in bed without assistance. He exhibits no focal weakness.  Lymphadenopathy:    He has no cervical adenopathy.  Neurological: He is alert. Coordination normal.  Patient is alert and oriented to person, place and time. No focal weakness. Patient is spontaneously moving all extremities in a coordinated fashion. Sensation is intact to his bilateral upper and lower extremities.   Skin: Skin is warm and dry. No rash noted. He is not diaphoretic. No erythema. No pallor.  Psychiatric: He has a normal mood and affect. His behavior is normal.  Nursing note and vitals reviewed.   ED Course  Procedures (including critical care time) Labs Review Labs Reviewed  BASIC METABOLIC PANEL - Abnormal; Notable for the following:    Potassium 3.4 (*)    Glucose, Bld 312 (*)    BUN 23 (*)     All other components within normal limits  CBC WITH DIFFERENTIAL/PLATELET - Abnormal; Notable for the following:    WBC 15.4 (*)    Neutrophils Relative % 82 (*)    Neutro Abs 12.6 (*)    Lymphocytes Relative 9 (*)    Monocytes Absolute 1.4 (*)    All other components within normal limits  URINALYSIS, ROUTINE W REFLEX MICROSCOPIC (NOT AT [facility]) - Abnormal; Notable for the following:    Glucose, UA 500 (*)    Protein, ur 100 (*)    All other components within normal limits  URINE MICROSCOPIC-ADD ON - Abnormal; Notable for the following:    Casts HYALINE CASTS (*)    All other components within normal limits  CBG MONITORING, ED - Abnormal; Notable for the following:    Glucose-Capillary 340 (*)    All other components within normal limits  CBG MONITORING, ED - Abnormal; Notable for the following:    Glucose-Capillary 137 (*)    All other components within normal limits  TROPONIN I  TROPONIN I  I-STAT TROPOININ, ED  I-STAT CG4 LACTIC ACID, ED    Imaging Review Dg Chest 2 View  10/23/2014   CLINICAL DATA:  Cough and weakness for 3 days.  EXAM: CHEST  2 VIEW  COMPARISON:  None.  FINDINGS: The heart is mildly enlarged. There is tortuosity and calcification of the thoracic aorta. Streaky right basilar scarring change or atelectasis. No infiltrates or effusions. Densities in the left lung appear to be associated with healed rib fractures. There are numerous thoracic and lumbar compression fractures of uncertain age. Surgical changes involving the left shoulder with a reverse total shoulder arthroplasty.  IMPRESSION: Cardiac enlargement and tortuous calcified thoracic aorta.  Probable chronic bronchitic changes and basilar scarring. No definite infiltrates or effusions.  Numerous thoracic and lumbar compression fractures.   Electronically Signed   By: Rudie Meyer M.D.   On: 10/23/2014 11:45   I, Lawana Chambers, personally reviewed and evaluated these images and lab results as part of  my medical decision-making.   EKG Interpretation   Date/Time:  Monday October 23 2014 11:11:07 EDT Ventricular Rate:  115 PR Interval:  124 QRS Duration: 134 QT Interval:  383 QTC Calculation: 530 R Axis:   86 Text Interpretation:  Sinus tachycardia with irregular rate Right bundle  branch block ST depression in V1-V3, consider ischemia vs. post infarct  Reconfirmed by POLLINA  MD, CHRISTOPHER [phone]) on 10/23/2014 11:42:22 AM  Filed Vitals:   10/23/14 1330 10/23/14 1400 10/23/14 1410 10/23/14 1624  BP: 152/109 154/98 171/108 159/134  Pulse:  103 109 109  Temp:   97.5 F (36.4 C)   TempSrc:   Oral   Resp: 26  20 18   SpO2:   94% 97%     MDM   Meds given in ED:  Medications  sodium chloride 0.9 % bolus 1,000 mL (0 mLs Intravenous Stopped 10/23/14 1119)  sodium chloride 0.9 % bolus 1,000 mL (0 mLs Intravenous Stopped 10/23/14 1508)    New Prescriptions   No medications on file    Final diagnoses:  EKG abnormality  Hyperglycemia   This is a 81 y.o. male with a history of diabetes, atrial fibrillation, coronary artery disease, hypertension and hyperlipidemia who presents to the emergency department from independent living complaining of generalized weakness for the past 9 weeks. Patient also reports cough for the past 2 weeks with productive sputum. The patient reports he's been taking his medications at home as prescribed. The patient's wife reports he is mentating at baseline. They report that he is not ambulatory at baseline. Patient has a history of dementia. On exam the patient is afebrile and nontoxic-appearing. His biggest hemorrhages are slightly dry. He appears generally weak and is unable to pull himself up in bed completely. He has no focal weakness. He is not coughing on exam. His lungs are slightly diminished in his right base but otherwise they're clear to auscultation. He is alert and generally oriented but sometimes confused about the location of where he is.  His EKG shows sinus tachycardia with the regular rate and a right bundle branch block. EKG shows ST depression in leads V1 through V3. This is new from his tracing from 2 years ago. The patient denies having any chest pain or shortness of breath. His initial blood glucose is 340. He has a normal lactic acid. Troponin is negative. Urinalysis is unremarkable. BMP is remarkable only for a glucose of 312. CBC shows a white count of 15.4 and is otherwise unremarkable. Patient received 2 L fluid bolus in the emergency department his blood sugar stabilized at 137. Plan is for delta troponin due to patient's EKG abnormality and reevaluate. Reevaluation the patient is more confused with his wife not at bedside. He is confused about the location of where he is. He is pulling himself up in bed and exhibiting good strength. Patient's wife return to bedside and can patient have his delta troponin checked. Patient still denies chest pain or shortness of breath. He reports feeling well and wants to go home. Repeat troponin was negative. Patient's wife feels comfortable taking him home. I encouraged him to follow-up with his primary care provider and cardiologist for the patient's increased blood pressure and his EKG changes. I advised the patient to follow-up with their primary care provider this week. I advised the patient to return to the emergency department with new or worsening symptoms or new concerns. The patient verbalized understanding and agreement with plan.    This patient was discussed with and evaluated by Dr. Blinda Leatherwood who agrees with assessment and plan.    Everlene Farrier, PA-C 10/23/14 1709  Gilda Crease, MD 10/24/14 1533  Gilda Crease, MD 10/24/14 1535

## 2014-10-23 NOTE — ED Notes (Signed)
Per EMS - patient comes from Independent Living at [facility] where he lives with his wife.  Staff called EMS b/c patient has been weak since Saturday.  On arrival, paramedics found CBG to be 424.  Patient also smells of urine and his clothes are soiled with food stains.  Patient states he has been trying to get to the bathroom "all morning."  Patient is unable to ambulate and that is baseline.  Patient is at mental baseline per wife and staff.  Patient's vitals on scene were 170/110, HR 110-120 (hx of afib, on Xarelto), 95% on RA.

## 2014-10-23 NOTE — ED Notes (Signed)
Unable to collect labs at this time because of patient behavior.  PA and Nurse is aware.

## 2014-10-23 NOTE — ED Notes (Signed)
Patient removed his IV and is refusing a second Troponin I.  Patient has gotten increasingly uncooperative and is refusing care.  Attempted to call his wife, but none of the numbers in the chart are answered and the voice mail box is full.

## 2014-10-23 NOTE — ED Notes (Signed)
Unable

## 2014-10-23 NOTE — ED Provider Notes (Signed)
Patient presented to the ER with generalized weakness. Patient found to be hyperglycemic prior to arrival.  Face to face Exam: HEENT - PERRLA Lungs - CTAB Heart - RRR, no M/R/G Abd - S/NT/ND Neuro - alert, confused  Plan: Patient confused at arrival, but has baseline dementia. Patient's EKG noted to be change from previous EKG from 2013. There are depressions in the anterior and septal leads, significance unclear. He is not expressing any chest pain currently. Initial troponin was negative. Patient will have repeat troponin performed, if the troponins are negative, without chest pain, patient is appropriate for outpatient management.  Gilda Crease, MD 10/23/14 [phone]

## 2014-10-23 NOTE — ED Notes (Signed)
Bed: WA09 Expected date:  Expected time:  Means of arrival:  Comments: EMS- 81yo M, generalized weakness/hyperglycemia/hypertensive

## 2014-10-23 NOTE — Progress Notes (Signed)
CSW met with patient at bedside. Wife was present. Patient confirms that he comes from [facility] and stays in the independent unit with his wife. Wife states that the patient completes his ADL's independently while at the facility, but says that she assist the patient with medication management because the patient is legally blind.   Wife states that the patient has fallen multiple times within the past 6 months.   Wife confirms that patient presents to [facility] due to hyperglycemia.   Wife and patient state they feel safe to return to facility. Wife informed CSW that their room at the facility has an alert button in case of emergencies.  Patient and wife state that they do not have any more questions for CSW.  Willette Brace [phone] ED CSW 10/23/2014 6:15 PM

## 2014-10-31 ENCOUNTER — Encounter: Payer: Medicare Other | Admitting: Physician Assistant

## 2014-11-06 ENCOUNTER — Other Ambulatory Visit: Payer: Self-pay | Admitting: Geriatric Medicine

## 2014-11-06 ENCOUNTER — Ambulatory Visit
Admission: RE | Admit: 2014-11-06 | Discharge: 2014-11-06 | Disposition: A | Discharge status: Home / Self Care | Payer: Medicare Other | Source: Ambulatory Visit | Attending: Geriatric Medicine | Admitting: Geriatric Medicine

## 2014-11-06 DIAGNOSIS — R52 Pain, unspecified: Secondary | ICD-10-CM

## 2014-11-30 ENCOUNTER — Other Ambulatory Visit: Payer: Self-pay | Admitting: *Deleted

## 2014-12-21 ENCOUNTER — Ambulatory Visit (INDEPENDENT_AMBULATORY_CARE_PROVIDER_SITE_OTHER): Payer: Medicare Other | Admitting: Neurology

## 2014-12-21 ENCOUNTER — Encounter: Payer: Self-pay | Admitting: Neurology

## 2014-12-21 VITALS — BP 148/78 | HR 92

## 2014-12-21 DIAGNOSIS — R443 Hallucinations, unspecified: Secondary | ICD-10-CM

## 2014-12-21 DIAGNOSIS — F0280 Dementia in other diseases classified elsewhere without behavioral disturbance: Secondary | ICD-10-CM | POA: Diagnosis not present

## 2014-12-21 DIAGNOSIS — G20 Parkinson's disease: Secondary | ICD-10-CM

## 2014-12-21 NOTE — Progress Notes (Signed)
Darryl Carroll was seen today in the movement disorders clinic for neurologic consultation at the request of Ginette Otto, MD.  The consultation is for the evaluation of gait changes and to r/o PD.  He is accompanied by his wife who supplements the history.   Pts wife states that he has fallen twice and "fx his spine."  Falls go back at least 6-7 years.  He has not fallen in the last few months, but sometimes he will fall several times in a week.  He lives at [facility] and does PT there 4 times per week.   CT of the brain performed last in February, 2013.  MRI of the brain performed last in June, 2012.  I tried to load the MRI of the brain, but the system did not find the examination, only the report.  It reported global atrophy, ventricular prominence that could be related to atrophy.  I also tried to load the CT of the brain, but again was not able to view the images and was only able to view the report, which demonstrated atrophy and chronic white matter disease along with basal ganglia calcifications.  07/02/24 update:  This patient is accompanied in the office by his spouse who supplements the history.  The patient was diagnosed with Parkinson's disease last visit and started on levodopa on 05/15/2014.  His wife called me on April 22 to state that the patient was having hallucinations, which seem to correlate with when he started medication.  They are both visual and auditory.   His wife states that he is more off balance.  Also, when he gets in the bed, he will sit down and lay across the bed instead of turning to lie vertical in the bed.  His wife states that he has had some of this in the past, but not this bad.  Having normal bowel movements and good bladder control.  His tremor is better with the medicine.  He was doing PT at [facility] but he quit going.    08/15/14 update:  This patient is accompanied in the office by his spouse who supplements the history.  Held carbidopa/levodopa  25/100 last visit due to hallucinations increasing and has done better in that regard.  He/wife are difficult to get a hold of on the phone.  Was going to try and change to the CR version over the phone but no response from them.  Therefore, he is currently off of PD meds.  Pts wife states that he isn't having hallucinations, but pt admits that he is still having some visual hallucinations but not as often.  He has had no falls.  He has a transport chair and uses that. Tremor seems a bit better. He has some lightheadedness.  No formal exercise.     12/21/14 update:  The patient returns today for follow-up, accompanied by his wife who supplements the history.  Records were reviewed since our last visit.  Last visit, I changed him to carbidopa/levodopa 25/100 CR 3 times per day as it seemed that the immediate release exacerbated hallucinations.  He had hallucinations even off of the medication, but the medication seemed to make it worse.  He does have baseline dementia.  Today, the patient and his wife state that he is now in a SNF and I reviewed SNF medications; he is not on carbidopa/levodopa at all.  His wife states that she doesn't know why - "you will need to ask Dr. [name].  He is  in charge."   He did present to the emergency room on 10/23/2014 with complaints of generalized weakness.  His white blood cell count was 15.4, but otherwise his workup was negative and he was sent home and not admitted.  He was taken to the SNF after this according to his wife.  Pt denies hallucinations but wife states that he is having those.  Pt does describe some hallucination.  States that a dog got run over in the parking lot yesterday and they brought the dead dog into his room but insists that this wasn't a hallucination but that it really happened.   PREVIOUS MEDICATIONS: none to date  ALLERGIES:   Allergies  Allergen Reactions  . Contrast Media [Iodinated Diagnostic Agents] Other (See Comments)    Reaction when  stints put in    CURRENT MEDICATIONS:  Outpatient Encounter Prescriptions as of 12/21/2014  Medication Sig  . albuterol (PROVENTIL) (5 MG/ML) 0.5% nebulizer solution Take 2.5 mg by nebulization every 4 (four) hours as needed. For shortness of breath  . atenolol (TENORMIN) 100 MG tablet Take 1 tablet (100 mg total) by mouth daily. (Patient taking differently: Take 50 mg by mouth daily with breakfast. )  . cholecalciferol (VITAMIN D) 1000 UNITS tablet Take 1,000 Units by mouth daily.  Marland Kitchen ezetimibe (ZETIA) 10 MG tablet Take 10 mg by mouth daily.  . furosemide (LASIX) 20 MG tablet Take 20 mg by mouth.  Marland Kitchen glimepiride (AMARYL) 1 MG tablet Take 1 mg by mouth daily with breakfast.  . HYDROcodone-acetaminophen (NORCO/VICODIN) 5-325 MG tablet Take 1 tablet by mouth every 6 (six) hours as needed for moderate pain.  Marland Kitchen insulin aspart (NOVOLOG) 100 UNIT/ML injection Inject into the skin 3 (three) times daily before meals.  Marland Kitchen lisinopril (PRINIVIL,ZESTRIL) 20 MG tablet Take 30 mg by mouth daily.   Marland Kitchen LORazepam (ATIVAN) 0.5 MG tablet Take 0.5 mg by mouth every 8 (eight) hours.  . metFORMIN (GLUCOPHAGE) 500 MG tablet Take 250 mg by mouth 2 (two) times daily with a meal.  . Multiple Vitamin (MULITIVITAMIN WITH MINERALS) TABS Take 1 tablet by mouth daily. Preservision/lutein  . potassium chloride (KLOR-CON) 20 MEQ packet Take by mouth 2 (two) times daily.  . [DISCONTINUED] acyclovir (ZOVIRAX) 400 MG tablet Take 400 mg by mouth 2 (two) times daily.   . [DISCONTINUED] alendronate (FOSAMAX) 70 MG tablet Take 70 mg by mouth every Tuesday. Take with a full glass of water on an empty stomach.  . [DISCONTINUED] allopurinol (ZYLOPRIM) 300 MG tablet Take 300 mg by mouth daily with breakfast.  . [DISCONTINUED] amLODipine (NORVASC) 10 MG tablet Take 1 tablet (10 mg total) by mouth daily.  . [DISCONTINUED] calcium-vitamin D (OSCAL 500/200 D-3) 500-200 MG-UNIT per tablet Take 1 tablet by mouth 2 (two) times daily with a meal.     . [DISCONTINUED] Carbidopa-Levodopa ER (SINEMET CR) 25-100 MG tablet controlled release Take 1 tablet by mouth 3 (three) times daily. (Patient not taking: Reported on 10/23/2014)  . [DISCONTINUED] citalopram (CELEXA) 40 MG tablet Take 40 mg by mouth daily.  . [DISCONTINUED] indapamide (LOZOL) 2.5 MG tablet TAKE 1 TABLET BY MOUTH 2 TIMES DAILY.  . [DISCONTINUED] insulin glargine (LANTUS) 100 UNIT/ML injection Inject 12 Units into the skin at bedtime.  . [DISCONTINUED] Multiple Vitamins-Minerals (PRESERVISION/LUTEIN) CAPS Take 1 capsule by mouth 2 (two) times daily.  . [DISCONTINUED] nitroGLYCERIN (NITROSTAT) 0.4 MG SL tablet Place 1 tablet (0.4 mg total) under the tongue every 5 (five) minutes as needed. For chest pain  . [  DISCONTINUED] omeprazole (PRILOSEC) 20 MG capsule Take 20 mg by mouth daily.  . [DISCONTINUED] rivaroxaban (XARELTO) 20 MG TABS tablet Take 1 tablet (20 mg total) by mouth daily with supper.  . [DISCONTINUED] sertraline (ZOLOFT) 100 MG tablet Take 100 mg by mouth daily.  . [DISCONTINUED] traMADol (ULTRAM) 50 MG tablet Take 50 mg by mouth every 6 (six) hours as needed for severe pain.   . [DISCONTINUED] TRAZODONE HCL PO Take 1 tablet by mouth every evening.   No facility-administered encounter medications on file as of 12/21/2014.    PAST MEDICAL HISTORY:   Past Medical History  Diagnosis Date  . DM (diabetes mellitus), type 2   . Spinal stenosis   . Glaucoma   . Gout   . Macular degeneration   . Obesity   . Osteoporosis   . CAD (coronary artery disease)     s/p multiple PCIs in all 3 vessels with DES. Last PCI 2005 with overlapping Cypher stents in CFX bifurcation lesion  . Paroxysmal atrial fibrillation   . HTN (hypertension)     not under good control  . Hyperlipidemia   . Sleep apnea   . Peptic ulcer disease     PAST SURGICAL HISTORY:   Past Surgical History  Procedure Laterality Date  . Thyroglossal duct cyst removed in jan or feb 2005    . Bilateral  carpal tunnel surgery    . Cardiac catheterization      x4-5, PCIs  . Turp vaporization      SOCIAL HISTORY:   Social History   Social History  . Marital Status: Married    Spouse Name: N/A  . Number of Children: N/A  . Years of Education: N/A   Occupational History  . Not on file.   Social History Main Topics  . Smoking status: Former Smoker    Types: Cigarettes    Quit date: 10/03/1983  . Smokeless tobacco: Never Used  . Alcohol Use: No  . Drug Use: No  . Sexual Activity: Not on file   Other Topics Concern  . Not on file   Social History Narrative    FAMILY HISTORY:   Family Status  Relation Status Death Age  . Father Deceased     MI  . Mother Deceased     diabetes  . Brother Deceased     MI    ROS:  A complete 10 system review of systems was obtained and was unremarkable apart from what is mentioned above.  PHYSICAL EXAMINATION:    VITALS:   Filed Vitals:   12/21/14 1332  BP: 148/78  Pulse: 92    GEN:  The patient appears stated age and is in NAD. HEENT:  Normocephalic, atraumatic.  The mucous membranes are moist. The superficial temporal arteries are without ropiness or tenderness. CV:  irreg irreg Lungs:  CTAB Neck/HEME:  There are no carotid bruits bilaterally.  Neurological examination:  Orientation: He is oriented to place and person but not time. Cranial nerves: There is good facial symmetry.There is fno significant ptosis today.   Extraocular muscles are intact. The visual fields are full to confrontational testing. The speech is fluent and clear.  He is hypophonic.  Soft palate rises symmetrically and there is no tongue deviation. Hearing is intact to conversational tone. Sensation: Sensation is intact to light touch throughout. Motor: Strength is 5/5 in the bilateral upper and lower extremities.     Movement examination: Tone: There is mod rigidity in the LUE Abnormal movements:  There is a left upper extremity resting tremor.     Coordination:  There is no decremation on the L with finger taps and toe taps Gait and Station: The patient is in a wheelchair and [facility]  Lab Results  Component Value Date   TSH 0.737 05/15/2014   Lab Results  Component Value Date   VITAMINB12 607 05/15/2014   Lab Results  Component Value Date   WBC 15.4* 10/23/2014   HGB 14.2 10/23/2014   HCT 41.6 10/23/2014   MCV 89.7 10/23/2014   PLT 228 10/23/2014     Chemistry      Component Value Date/Time   NA 142 10/23/2014 1045   K 3.4* 10/23/2014 1045   CL 102 10/23/2014 1045   CO2 30 10/23/2014 1045   BUN 23* 10/23/2014 1045   CREATININE 0.85 10/23/2014 1045   CREATININE 0.85 07/03/2014 1427      Component Value Date/Time   CALCIUM 9.3 10/23/2014 1045   ALKPHOS 99 07/03/2014 1427   AST 20 07/03/2014 1427   ALT <8 07/03/2014 1427   BILITOT 0.5 07/03/2014 1427        ASSESSMENT/PLAN:  1. Idiopathic Parkinson's disease.  The patient has tremor, bradykinesia, rigidity and postural instability.  -The carbidopa/levodopa that we started previously increased hallucinations.  I then changed him last visit the CR version of the medication, but he returns today off of all medication.  He is now in a nursing facility and his wife cannot state why or when it was discontinued.  She is very clear in stating that his primary care physician is now in charge of his medications, so I did not change this at all today. 2.  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  We discussed Nuplazid.  We discussed extensively with risks and benefits.  We discussed prolongation of the QT interval.  They would like to try it.  I had him fill out paperwork for this today.  Risks, benefits, side effects and alternative therapies were discussed.  The opportunity to ask questions was given and they were answered to the best  of my ability.  The patient expressed understanding and willingness to follow the outlined treatment protocols. 3.  Fluctuating Ptosis  -Patient's wife states that this has been going on ever since a surgery for a herpetic infection.  It is odd that this would be fluctuating ptosis if it is structural, but she states it has been going on for years.  His myasthenia labs were negative. 4. Follow up is anticipated in the next few months, sooner should new neurologic issues arise.  Much greater than 50% of this visit was spent in counseling with the patient and the family.  Total face to face time:  25 min

## 2015-01-05 ENCOUNTER — Telehealth: Payer: Self-pay | Admitting: Neurology

## 2015-01-05 NOTE — Telephone Encounter (Signed)
Alyssa from [employer] called in regards to PT's medication Nuplazid CB# [phone]

## 2015-01-08 NOTE — Telephone Encounter (Signed)
Tried to call and had to LMOM for [employer] connect to call back if they have any questions.  

## 2015-02-15 ENCOUNTER — Other Ambulatory Visit: Payer: Self-pay | Admitting: Geriatric Medicine

## 2015-02-15 ENCOUNTER — Ambulatory Visit
Admission: RE | Admit: 2015-02-15 | Discharge: 2015-02-15 | Disposition: A | Discharge status: Home / Self Care | Payer: Medicare Other | Source: Ambulatory Visit | Attending: Geriatric Medicine | Admitting: Geriatric Medicine

## 2015-02-15 DIAGNOSIS — R609 Edema, unspecified: Secondary | ICD-10-CM

## 2015-02-15 DIAGNOSIS — L539 Erythematous condition, unspecified: Secondary | ICD-10-CM

## 2015-04-12 ENCOUNTER — Encounter: Payer: Self-pay | Admitting: Neurology

## 2015-04-12 ENCOUNTER — Ambulatory Visit (INDEPENDENT_AMBULATORY_CARE_PROVIDER_SITE_OTHER): Payer: Medicare Other | Admitting: Neurology

## 2015-04-12 VITALS — BP 118/72 | HR 72

## 2015-04-12 DIAGNOSIS — F0280 Dementia in other diseases classified elsewhere without behavioral disturbance: Secondary | ICD-10-CM | POA: Diagnosis not present

## 2015-04-12 DIAGNOSIS — G20 Parkinson's disease: Secondary | ICD-10-CM

## 2015-04-12 DIAGNOSIS — R443 Hallucinations, unspecified: Secondary | ICD-10-CM

## 2015-04-12 NOTE — Progress Notes (Signed)
Darryl Carroll was seen today in the movement disorders clinic for neurologic consultation at the request of Ginette Otto, MD.  The consultation is for the evaluation of gait changes and to r/o PD.  He is accompanied by his wife who supplements the history.   Pts wife states that he has fallen twice and "fx his spine."  Falls go back at least 6-7 years.  He has not fallen in the last few months, but sometimes he will fall several times in a week.  He lives at [facility] and does PT there 4 times per week.   CT of the brain performed last in February, 2013.  MRI of the brain performed last in June, 2012.  I tried to load the MRI of the brain, but the system did not find the examination, only the report.  It reported global atrophy, ventricular prominence that could be related to atrophy.  I also tried to load the CT of the brain, but again was not able to view the images and was only able to view the report, which demonstrated atrophy and chronic white matter disease along with basal ganglia calcifications.  07/02/24 update:  This patient is accompanied in the office by his spouse who supplements the history.  The patient was diagnosed with Parkinson's disease last visit and started on levodopa on 05/15/2014.  His wife called me on April 22 to state that the patient was having hallucinations, which seem to correlate with when he started medication.  They are both visual and auditory.   His wife states that he is more off balance.  Also, when he gets in the bed, he will sit down and lay across the bed instead of turning to lie vertical in the bed.  His wife states that he has had some of this in the past, but not this bad.  Having normal bowel movements and good bladder control.  His tremor is better with the medicine.  He was doing PT at [facility] but he quit going.    08/15/14 update:  This patient is accompanied in the office by his spouse who supplements the history.  Held carbidopa/levodopa  25/100 last visit due to hallucinations increasing and has done better in that regard.  He/wife are difficult to get a hold of on the phone.  Was going to try and change to the CR version over the phone but no response from them.  Therefore, he is currently off of PD meds.  Pts wife states that he isn't having hallucinations, but pt admits that he is still having some visual hallucinations but not as often.  He has had no falls.  He has a transport chair and uses that. Tremor seems a bit better. He has some lightheadedness.  No formal exercise.     12/21/14 update:  The patient returns today for follow-up, accompanied by his wife who supplements the history.  Records were reviewed since our last visit.  Last visit, I changed him to carbidopa/levodopa 25/100 CR 3 times per day as it seemed that the immediate release exacerbated hallucinations.  He had hallucinations even off of the medication, but the medication seemed to make it worse.  He does have baseline dementia.  Today, the patient and his wife state that he is now in a SNF and I reviewed SNF medications; he is not on carbidopa/levodopa at all.  His wife states that she doesn't know why - "you will need to ask Dr. [name].  He is  in charge."   He did present to the emergency room on 10/23/2014 with complaints of generalized weakness.  His white blood cell count was 15.4, but otherwise his workup was negative and he was sent home and not admitted.  He was taken to the SNF after this according to his wife.  Pt denies hallucinations but wife states that he is having those.  Pt does describe some hallucination.  States that a dog got run over in the parking lot yesterday and they brought the dead dog into his room but insists that this wasn't a hallucination but that it really happened.  04/12/15 update: The patient returns today, accompanied by his wife who supplements the history.  The patient is on no medication for Parkinson's disease.  It was  discontinued by another physician and his wife did not want me to restart it, stating that his primary care physician was in charge of his medications.  I did call PCP office to get notes but didn't see why the levodopa was stopped or where/when it was d/c.   However, she did want me to help with his hallucinations last visit and therefore we initiated Nuplazid.  His hallucinations have resolved on the medication.  He is frustrated that the SNF won't let him walk but wife states that she was unaware of this.  PREVIOUS MEDICATIONS: none to date  ALLERGIES:   Allergies  Allergen Reactions  . Contrast Media [Iodinated Diagnostic Agents] Other (See Comments)    Reaction when stints put in    CURRENT MEDICATIONS:  Outpatient Encounter Prescriptions as of 04/12/2015  Medication Sig  . albuterol (PROVENTIL) (5 MG/ML) 0.5% nebulizer solution Take 2.5 mg by nebulization every 4 (four) hours as needed. For shortness of breath  . atenolol (TENORMIN) 100 MG tablet Take 1 tablet (100 mg total) by mouth daily. (Patient taking differently: Take 50 mg by mouth daily with breakfast. )  . cholecalciferol (VITAMIN D) 1000 UNITS tablet Take 1,000 Units by mouth daily.  Marland Kitchen ezetimibe (ZETIA) 10 MG tablet Take 10 mg by mouth daily.  . furosemide (LASIX) 20 MG tablet Take 20 mg by mouth.  Marland Kitchen glimepiride (AMARYL) 1 MG tablet Take 1 mg by mouth daily with breakfast.  . HYDROcodone-acetaminophen (NORCO/VICODIN) 5-325 MG tablet Take 1 tablet by mouth every 6 (six) hours as needed for moderate pain.  Marland Kitchen insulin aspart (NOVOLOG) 100 UNIT/ML injection Inject into the skin 3 (three) times daily before meals.  Marland Kitchen lisinopril (PRINIVIL,ZESTRIL) 20 MG tablet Take 30 mg by mouth daily.   Marland Kitchen LORazepam (ATIVAN) 0.5 MG tablet Take 0.5 mg by mouth every 8 (eight) hours.  . metFORMIN (GLUCOPHAGE) 500 MG tablet Take 250 mg by mouth 2 (two) times daily with a meal.  . Multiple Vitamin (MULITIVITAMIN WITH MINERALS) TABS Take 1 tablet by mouth  daily. Preservision/lutein  . potassium chloride (KLOR-CON) 20 MEQ packet Take by mouth 2 (two) times daily.   No facility-administered encounter medications on file as of 04/12/2015.    PAST MEDICAL HISTORY:   Past Medical History  Diagnosis Date  . DM (diabetes mellitus), type 2 (HCC)   . Spinal stenosis   . Glaucoma   . Gout   . Macular degeneration   . Obesity   . Osteoporosis   . CAD (coronary artery disease)     s/p multiple PCIs in all 3 vessels with DES. Last PCI 2005 with overlapping Cypher stents in CFX bifurcation lesion  . Paroxysmal atrial fibrillation (HCC)   .  HTN (hypertension)     not under good control  . Hyperlipidemia   . Sleep apnea   . Peptic ulcer disease     PAST SURGICAL HISTORY:   Past Surgical History  Procedure Laterality Date  . Thyroglossal duct cyst removed in jan or feb 2005    . Bilateral carpal tunnel surgery    . Cardiac catheterization      x4-5, PCIs  . Turp vaporization      SOCIAL HISTORY:   Social History   Social History  . Marital Status: Married    Spouse Name: N/A  . Number of Children: N/A  . Years of Education: N/A   Occupational History  . Not on file.   Social History Main Topics  . Smoking status: Former Smoker    Types: Cigarettes    Quit date: 10/03/1983  . Smokeless tobacco: Never Used  . Alcohol Use: No  . Drug Use: No  . Sexual Activity: Not on file   Other Topics Concern  . Not on file   Social History Narrative    FAMILY HISTORY:   Family Status  Relation Status Death Age  . Father Deceased     MI  . Mother Deceased     diabetes  . Brother Deceased     MI    ROS:  A complete 10 system review of systems was obtained and was unremarkable apart from what is mentioned above.  PHYSICAL EXAMINATION:    VITALS:   Filed Vitals:   04/12/15 0937  BP: 118/72  Pulse: 72    GEN:  The patient appears stated age and is in NAD. HEENT:  Normocephalic, atraumatic.  The mucous membranes are moist.  The superficial temporal arteries are without ropiness or tenderness. CV:  irreg irreg Lungs:  CTAB Neck/HEME:  There are no carotid bruits bilaterally.  Neurological examination:  Orientation: He is oriented to place and person but not time. Cranial nerves: There is good facial symmetry.There is fno significant ptosis today.   Extraocular muscles are intact. The visual fields are full to confrontational testing. The speech is fluent and clear.  He is hypophonic.  Soft palate rises symmetrically and there is no tongue deviation. Hearing is intact to conversational tone. Sensation: Sensation is intact to light touch throughout. Motor: Strength is 5/5 in the bilateral upper and lower extremities.     Movement examination: Tone: There is mild rigidity in the LUE Abnormal movements: There is a left upper extremity resting tremor, overall very mild Coordination:  There is no decremation today Gait and Station: The patient is in a wheelchair and reports that he is mostly getting around in the [facility] now.  I gave him a walker and he walker very well with it with proper technique inside the walker and didn't shuffle with it.  Lab Results  Component Value Date   TSH 0.737 05/15/2014   Lab Results  Component Value Date   VITAMINB12 607 05/15/2014   Lab Results  Component Value Date   WBC 15.4* 10/23/2014   HGB 14.2 10/23/2014   HCT 41.6 10/23/2014   MCV 89.7 10/23/2014   PLT 228 10/23/2014     Chemistry      Component Value Date/Time   NA 142 10/23/2014 1045   K 3.4* 10/23/2014 1045   CL 102 10/23/2014 1045   CO2 30 10/23/2014 1045   BUN 23* 10/23/2014 1045   CREATININE 0.85 10/23/2014 1045   CREATININE 0.85 07/03/2014 1427  Component Value Date/Time   CALCIUM 9.3 10/23/2014 1045   ALKPHOS 99 07/03/2014 1427   AST 20 07/03/2014 1427   ALT <8 07/03/2014 1427   BILITOT 0.5 07/03/2014 1427        ASSESSMENT/PLAN:  1. Idiopathic Parkinson's disease.  The patient has  tremor, bradykinesia, rigidity and postural instability.  -He is off of all levodopa.  Not sure why it was d/c at the SNF but wife wants PCP to be in charge of meds and I will defer  -really able to walk well with walker.  Will order PT since pt states that not allowed to walk at SNF per their rules but saw no reason today shouldn't be able to safely walk with walker. 2.  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  Fluctuating Ptosis  -Patient's wife states that this has been going on ever since a surgery for a herpetic infection.  It is odd that this would be fluctuating ptosis if it is structural, but she states it has been going on for years.  His myasthenia labs were negative. 4. Follow up prn

## 2015-04-13 ENCOUNTER — Ambulatory Visit: Payer: Medicare Other | Admitting: Neurology

## 2015-04-13 ENCOUNTER — Telehealth: Payer: Self-pay | Admitting: Neurology

## 2015-04-13 NOTE — Telephone Encounter (Signed)
[facility] called to let us know patient is on Carbidopa Levodopa 25/100 CR - 1 tablet 3 times daily. I made her aware this is not on his medication requisition list. She will check into that- but she states he is taking it.

## 2015-04-13 NOTE — Addendum Note (Signed)
Addended bySilvio Pate on: 04/13/2015 10:57 AM   Modules accepted: Medications

## 2016-02-20 ENCOUNTER — Ambulatory Visit
Admission: RE | Admit: 2016-02-20 | Discharge: 2016-02-20 | Disposition: A | Discharge status: Home / Self Care | Payer: Medicare Other | Source: Ambulatory Visit | Attending: Geriatric Medicine | Admitting: Geriatric Medicine

## 2016-02-20 ENCOUNTER — Other Ambulatory Visit: Payer: Self-pay | Admitting: Geriatric Medicine

## 2016-02-20 DIAGNOSIS — R52 Pain, unspecified: Secondary | ICD-10-CM

## 2017-03-11 ENCOUNTER — Inpatient Hospital Stay (HOSPITAL_COMMUNITY)
Admission: EM | Admit: 2017-03-11 | Discharge: 2017-03-13 | DRG: 065 | Disposition: A | Discharge status: Skilled Nursing Facility | Payer: Medicare Other | Attending: Family Medicine | Admitting: Family Medicine

## 2017-03-11 ENCOUNTER — Emergency Department (HOSPITAL_COMMUNITY): Payer: Medicare Other

## 2017-03-11 ENCOUNTER — Observation Stay (HOSPITAL_COMMUNITY): Payer: Medicare Other

## 2017-03-11 DIAGNOSIS — F05 Delirium due to known physiological condition: Secondary | ICD-10-CM | POA: Diagnosis not present

## 2017-03-11 DIAGNOSIS — I4510 Unspecified right bundle-branch block: Secondary | ICD-10-CM | POA: Diagnosis present

## 2017-03-11 DIAGNOSIS — G20A1 Parkinson's disease without dyskinesia, without mention of fluctuations: Secondary | ICD-10-CM

## 2017-03-11 DIAGNOSIS — G20 Parkinson's disease: Secondary | ICD-10-CM | POA: Diagnosis present

## 2017-03-11 DIAGNOSIS — R29810 Facial weakness: Secondary | ICD-10-CM

## 2017-03-11 DIAGNOSIS — F0281 Dementia in other diseases classified elsewhere with behavioral disturbance: Secondary | ICD-10-CM

## 2017-03-11 DIAGNOSIS — E1122 Type 2 diabetes mellitus with diabetic chronic kidney disease: Secondary | ICD-10-CM | POA: Diagnosis present

## 2017-03-11 DIAGNOSIS — I482 Chronic atrial fibrillation: Secondary | ICD-10-CM | POA: Diagnosis present

## 2017-03-11 DIAGNOSIS — Z8711 Personal history of peptic ulcer disease: Secondary | ICD-10-CM

## 2017-03-11 DIAGNOSIS — I480 Paroxysmal atrial fibrillation: Secondary | ICD-10-CM | POA: Diagnosis present

## 2017-03-11 DIAGNOSIS — Z91041 Radiographic dye allergy status: Secondary | ICD-10-CM

## 2017-03-11 DIAGNOSIS — I10 Essential (primary) hypertension: Secondary | ICD-10-CM

## 2017-03-11 DIAGNOSIS — R471 Dysarthria and anarthria: Secondary | ICD-10-CM | POA: Diagnosis present

## 2017-03-11 DIAGNOSIS — K279 Peptic ulcer, site unspecified, unspecified as acute or chronic, without hemorrhage or perforation: Secondary | ICD-10-CM | POA: Diagnosis present

## 2017-03-11 DIAGNOSIS — Z87891 Personal history of nicotine dependence: Secondary | ICD-10-CM

## 2017-03-11 DIAGNOSIS — I4891 Unspecified atrial fibrillation: Secondary | ICD-10-CM

## 2017-03-11 DIAGNOSIS — R29712 NIHSS score 12: Secondary | ICD-10-CM | POA: Diagnosis present

## 2017-03-11 DIAGNOSIS — I2510 Atherosclerotic heart disease of native coronary artery without angina pectoris: Secondary | ICD-10-CM | POA: Diagnosis present

## 2017-03-11 DIAGNOSIS — G4733 Obstructive sleep apnea (adult) (pediatric): Secondary | ICD-10-CM | POA: Diagnosis present

## 2017-03-11 DIAGNOSIS — N189 Chronic kidney disease, unspecified: Secondary | ICD-10-CM | POA: Diagnosis present

## 2017-03-11 DIAGNOSIS — F02818 Dementia in other diseases classified elsewhere, unspecified severity, with other behavioral disturbance: Secondary | ICD-10-CM

## 2017-03-11 DIAGNOSIS — H3530 Unspecified macular degeneration: Secondary | ICD-10-CM | POA: Diagnosis present

## 2017-03-11 DIAGNOSIS — F0280 Dementia in other diseases classified elsewhere without behavioral disturbance: Secondary | ICD-10-CM | POA: Diagnosis present

## 2017-03-11 DIAGNOSIS — I639 Cerebral infarction, unspecified: Principal | ICD-10-CM

## 2017-03-11 DIAGNOSIS — E785 Hyperlipidemia, unspecified: Secondary | ICD-10-CM

## 2017-03-11 DIAGNOSIS — Z792 Long term (current) use of antibiotics: Secondary | ICD-10-CM

## 2017-03-11 DIAGNOSIS — Z888 Allergy status to other drugs, medicaments and biological substances status: Secondary | ICD-10-CM

## 2017-03-11 DIAGNOSIS — Z96612 Presence of left artificial shoulder joint: Secondary | ICD-10-CM | POA: Diagnosis present

## 2017-03-11 DIAGNOSIS — I129 Hypertensive chronic kidney disease with stage 1 through stage 4 chronic kidney disease, or unspecified chronic kidney disease: Secondary | ICD-10-CM | POA: Diagnosis present

## 2017-03-11 DIAGNOSIS — Z955 Presence of coronary angioplasty implant and graft: Secondary | ICD-10-CM

## 2017-03-11 DIAGNOSIS — M810 Age-related osteoporosis without current pathological fracture: Secondary | ICD-10-CM | POA: Diagnosis present

## 2017-03-11 DIAGNOSIS — Z8619 Personal history of other infectious and parasitic diseases: Secondary | ICD-10-CM

## 2017-03-11 DIAGNOSIS — J449 Chronic obstructive pulmonary disease, unspecified: Secondary | ICD-10-CM | POA: Diagnosis present

## 2017-03-11 DIAGNOSIS — Z7901 Long term (current) use of anticoagulants: Secondary | ICD-10-CM

## 2017-03-11 DIAGNOSIS — R1310 Dysphagia, unspecified: Secondary | ICD-10-CM | POA: Diagnosis present

## 2017-03-11 DIAGNOSIS — I6623 Occlusion and stenosis of bilateral posterior cerebral arteries: Secondary | ICD-10-CM | POA: Diagnosis present

## 2017-03-11 DIAGNOSIS — F329 Major depressive disorder, single episode, unspecified: Secondary | ICD-10-CM | POA: Diagnosis present

## 2017-03-11 DIAGNOSIS — M4800 Spinal stenosis, site unspecified: Secondary | ICD-10-CM | POA: Diagnosis present

## 2017-03-11 DIAGNOSIS — G4730 Sleep apnea, unspecified: Secondary | ICD-10-CM | POA: Diagnosis present

## 2017-03-11 DIAGNOSIS — Z79899 Other long term (current) drug therapy: Secondary | ICD-10-CM

## 2017-03-11 DIAGNOSIS — G8191 Hemiplegia, unspecified affecting right dominant side: Secondary | ICD-10-CM | POA: Diagnosis present

## 2017-03-11 DIAGNOSIS — H409 Unspecified glaucoma: Secondary | ICD-10-CM | POA: Diagnosis present

## 2017-03-11 DIAGNOSIS — Z794 Long term (current) use of insulin: Secondary | ICD-10-CM

## 2017-03-11 DIAGNOSIS — Z9989 Dependence on other enabling machines and devices: Secondary | ICD-10-CM

## 2017-03-11 DIAGNOSIS — M109 Gout, unspecified: Secondary | ICD-10-CM | POA: Diagnosis present

## 2017-03-11 LAB — COMPREHENSIVE METABOLIC PANEL
ALK PHOS: 70 U/L (ref 38–126)
ALT: 6 U/L — ABNORMAL LOW (ref 17–63)
ANION GAP: 9 (ref 5–15)
AST: 31 U/L (ref 15–41)
Albumin: 3.2 g/dL — ABNORMAL LOW (ref 3.5–5.0)
BILIRUBIN TOTAL: 0.8 mg/dL (ref 0.3–1.2)
BUN: 17 mg/dL (ref 6–20)
CALCIUM: 9.1 mg/dL (ref 8.9–10.3)
CO2: 24 mmol/L (ref 22–32)
Chloride: 107 mmol/L (ref 101–111)
Creatinine, Ser: 1.03 mg/dL (ref 0.61–1.24)
GFR calc non Af Amer: >60 mL/min (ref >60)
Glucose, Bld: 145 mg/dL — ABNORMAL HIGH (ref 65–99)
Potassium: 3.5 mmol/L (ref 3.5–5.1)
SODIUM: 140 mmol/L (ref 135–145)
TOTAL PROTEIN: 6 g/dL — AB (ref 6.5–8.1)

## 2017-03-11 LAB — I-STAT CHEM 8, ED
BUN: 19 mg/dL (ref 6–20)
CALCIUM ION: 0.96 mmol/L — AB (ref 1.15–1.40)
Chloride: 107 mmol/L (ref 101–111)
Creatinine, Ser: 1 mg/dL (ref 0.61–1.24)
GLUCOSE: 150 mg/dL — AB (ref 65–99)
HCT: 42 % (ref 39.0–52.0)
HEMOGLOBIN: 14.3 g/dL (ref 13.0–17.0)
Potassium: 3.5 mmol/L (ref 3.5–5.1)
Sodium: 143 mmol/L (ref 135–145)
TCO2: 22 mmol/L (ref 22–32)

## 2017-03-11 LAB — DIFFERENTIAL
Basophils Absolute: 0 10*3/uL (ref 0.0–0.1)
Basophils Relative: 0 %
EOS PCT: 4 %
Eosinophils Absolute: 0.4 10*3/uL (ref 0.0–0.7)
LYMPHS ABS: 1.9 10*3/uL (ref 0.7–4.0)
LYMPHS PCT: 18 %
MONO ABS: 1.1 10*3/uL — AB (ref 0.1–1.0)
Monocytes Relative: 10 %
Neutro Abs: 7.2 10*3/uL (ref 1.7–7.7)
Neutrophils Relative %: 68 %

## 2017-03-11 LAB — CBC
HCT: 42.8 % (ref 39.0–52.0)
Hemoglobin: 14.2 g/dL (ref 13.0–17.0)
MCH: 29.3 pg (ref 26.0–34.0)
MCHC: 33.2 g/dL (ref 30.0–36.0)
MCV: 88.4 fL (ref 78.0–100.0)
Platelets: 186 10*3/uL (ref 150–400)
RBC: 4.84 MIL/uL (ref 4.22–5.81)
RDW: 15.8 % — ABNORMAL HIGH (ref 11.5–15.5)
WBC: 10.6 10*3/uL — ABNORMAL HIGH (ref 4.0–10.5)

## 2017-03-11 LAB — GLUCOSE, CAPILLARY: Glucose-Capillary: 154 mg/dL — ABNORMAL HIGH (ref 65–99)

## 2017-03-11 LAB — MRSA PCR SCREENING: MRSA by PCR: NEGATIVE

## 2017-03-11 LAB — PROTIME-INR
INR: 1.25
PROTHROMBIN TIME: 15.6 s — AB (ref 11.4–15.2)

## 2017-03-11 LAB — HEMOGLOBIN A1C
HEMOGLOBIN A1C: 6.6 % — AB (ref 4.8–5.6)
Mean Plasma Glucose: 142.72 mg/dL

## 2017-03-11 LAB — I-STAT TROPONIN, ED: TROPONIN I, POC: 0 ng/mL (ref 0.00–0.08)

## 2017-03-11 LAB — CBG MONITORING, ED: GLUCOSE-CAPILLARY: 153 mg/dL — AB (ref 65–99)

## 2017-03-11 LAB — APTT: aPTT: 41 seconds — ABNORMAL HIGH (ref 24–36)

## 2017-03-11 MED ORDER — ATORVASTATIN CALCIUM 80 MG PO TABS: 80.0000 mg | ORAL_TABLET | Freq: Every day | ORAL | Status: DC

## 2017-03-11 MED ORDER — DOXYCYCLINE HYCLATE 100 MG PO TABS
100.0000 mg | ORAL_TABLET | Freq: Two times a day (BID) | ORAL | Status: DC
  Administered 2017-03-12 – 2017-03-13 (×3): 100 mg via ORAL
  Filled 2017-03-11 (×5): qty 1

## 2017-03-11 MED ORDER — SACCHAROMYCES BOULARDII 250 MG PO CAPS
250.0000 mg | ORAL_CAPSULE | Freq: Every day | ORAL | Status: DC
  Administered 2017-03-12: 250 mg via ORAL
  Filled 2017-03-11: qty 1

## 2017-03-11 MED ORDER — ACYCLOVIR 400 MG PO TABS
400.0000 mg | ORAL_TABLET | Freq: Two times a day (BID) | ORAL | Status: DC
  Administered 2017-03-12 – 2017-03-13 (×3): 400 mg via ORAL
  Filled 2017-03-11 (×5): qty 1

## 2017-03-11 MED ORDER — CARBIDOPA-LEVODOPA ER 25-100 MG PO TBCR
1.0000 | EXTENDED_RELEASE_TABLET | Freq: Three times a day (TID) | ORAL | Status: DC
  Administered 2017-03-12 – 2017-03-13 (×5): 1 via ORAL
  Filled 2017-03-11 (×7): qty 1

## 2017-03-11 MED ORDER — VITAMIN D 1000 UNITS PO TABS
1000.0000 [IU] | ORAL_TABLET | Freq: Every day | ORAL | Status: DC
  Administered 2017-03-12 – 2017-03-13 (×2): 1000 [IU] via ORAL
  Filled 2017-03-11 (×2): qty 1

## 2017-03-11 MED ORDER — ACETAMINOPHEN 160 MG/5ML PO SOLN: 650.0000 mg | ORAL | Status: DC | PRN

## 2017-03-11 MED ORDER — MULTI-VITAMIN/MINERALS PO TABS: 1.0000 | ORAL_TABLET | Freq: Every day | ORAL | Status: DC

## 2017-03-11 MED ORDER — HALOPERIDOL LACTATE 5 MG/ML IJ SOLN
2.0000 mg | Freq: Four times a day (QID) | INTRAMUSCULAR | Status: DC | PRN
  Administered 2017-03-11 (×2): 2 mg via INTRAVENOUS
  Filled 2017-03-11 (×2): qty 1

## 2017-03-11 MED ORDER — ADULT MULTIVITAMIN W/MINERALS CH
1.0000 | ORAL_TABLET | Freq: Every day | ORAL | Status: DC
  Administered 2017-03-12 – 2017-03-13 (×2): 1 via ORAL
  Filled 2017-03-11 (×2): qty 1

## 2017-03-11 MED ORDER — RIVAROXABAN 15 MG PO TABS
15.0000 mg | ORAL_TABLET | Freq: Every day | ORAL | Status: DC
  Administered 2017-03-12: 15 mg via ORAL
  Filled 2017-03-11 (×2): qty 1

## 2017-03-11 MED ORDER — SODIUM CHLORIDE 0.9 % IV SOLN
INTRAVENOUS | Status: DC
  Administered 2017-03-12: 06:00:00 via INTRAVENOUS

## 2017-03-11 MED ORDER — VITAMIN D-3 25 MCG (1000 UT) PO CAPS: 1000.0000 [IU] | ORAL_CAPSULE | Freq: Every day | ORAL | Status: DC

## 2017-03-11 MED ORDER — DILTIAZEM HCL ER COATED BEADS 120 MG PO CP24
120.0000 mg | ORAL_CAPSULE | Freq: Every day | ORAL | Status: DC
  Administered 2017-03-12 – 2017-03-13 (×2): 120 mg via ORAL
  Filled 2017-03-11 (×2): qty 1

## 2017-03-11 MED ORDER — ACETAMINOPHEN 325 MG PO TABS: 650.0000 mg | ORAL_TABLET | ORAL | Status: DC | PRN

## 2017-03-11 MED ORDER — STROKE: EARLY STAGES OF RECOVERY BOOK
Freq: Once | Status: DC
  Filled 2017-03-11: qty 1

## 2017-03-11 MED ORDER — SERTRALINE HCL 50 MG PO TABS
75.0000 mg | ORAL_TABLET | Freq: Every day | ORAL | Status: DC
  Administered 2017-03-12 – 2017-03-13 (×2): 75 mg via ORAL
  Filled 2017-03-11 (×2): qty 2

## 2017-03-11 MED ORDER — INSULIN ASPART 100 UNIT/ML ~~LOC~~ SOLN
0.0000 [IU] | Freq: Three times a day (TID) | SUBCUTANEOUS | Status: DC
  Administered 2017-03-12: 1 [IU] via SUBCUTANEOUS
  Administered 2017-03-13: 2 [IU] via SUBCUTANEOUS
  Administered 2017-03-13: 1 [IU] via SUBCUTANEOUS

## 2017-03-11 MED ORDER — LISINOPRIL 20 MG PO TABS: 40.0000 mg | ORAL_TABLET | Freq: Every day | ORAL | Status: DC

## 2017-03-11 MED ORDER — ACETAMINOPHEN 650 MG RE SUPP: 650.0000 mg | RECTAL | Status: DC | PRN

## 2017-03-11 MED ORDER — ATENOLOL 50 MG PO TABS: 100.0000 mg | ORAL_TABLET | Freq: Every day | ORAL | Status: DC

## 2017-03-11 NOTE — Consult Note (Addendum)
Requesting Physician: Dr. Rosalia Hammers    Chief Complaint: stroke  History obtained from:  EMS  HPI:                                                                                                                                         Darryl Carroll is an 84 y.o. male brought to [facility] due to sudden onset of left  gaze preference and right facial droop.   He is on Xeralto which was last administered at yesterday thus not a tPA candidate. On arrival had dysarthria but no aphasia. Showed right sided weakness and decreased sensation. Due to dye allergy CTA was not obtained.   NIHS 12 Date last known well: Date: 03/11/2017 Time last known well: Time: 12:15 tPA Given: No: on AC Modified Rankin: Rankin Score=3    Past Medical History:  Diagnosis Date  . CAD (coronary artery disease)    s/p multiple PCIs in all 3 vessels with DES. Last PCI 2005 with overlapping Cypher stents in CFX bifurcation lesion  . DM (diabetes mellitus), type 2 (HCC)   . Glaucoma   . Gout   . HTN (hypertension)    not under good control  . Hyperlipidemia   . Macular degeneration   . Obesity   . Osteoporosis   . Paroxysmal atrial fibrillation (HCC)   . Peptic ulcer disease   . Sleep apnea   . Spinal stenosis     Past Surgical History:  Procedure Laterality Date  . Bilateral carpal tunnel surgery    . CARDIAC CATHETERIZATION     x4-5, PCIs  . Thyroglossal duct cyst removed in Jan or Feb 2005    . TURP VAPORIZATION      Family History  Problem Relation Age of Onset  . Diabetes Mother        deceased 39  . Heart attack Father        deceased 64  . Heart attack Brother        deceased 10   Social History:  reports that he quit smoking about 33 years ago. His smoking use included cigarettes. he has never used smokeless tobacco. He reports that he does not drink alcohol or use drugs.  Allergies:  Allergies  Allergen Reactions  . Contrast Media [Iodinated Diagnostic Agents] Other (See Comments)   Reaction when stints put in    Medications:  No current facility-administered medications for this encounter.    Current Outpatient Medications  Medication Sig Dispense Refill  . atenolol (TENORMIN) 100 MG tablet Take 1 tablet (100 mg total) by mouth daily. (Patient taking differently: Take 50 mg by mouth daily with breakfast. ) 90 tablet 3  . Calcium Citrate-Vitamin D (CALCIUM + D PO) Take by mouth.    . Carbidopa-Levodopa ER (SINEMET CR) 25-100 MG tablet controlled release Take 1 tablet by mouth 3 (three) times daily.    . ciprofloxacin (CIPRO) 500 MG tablet Take 500 mg by mouth 2 (two) times daily.    Marland Kitchen HYDROcodone-acetaminophen (NORCO/VICODIN) 5-325 MG tablet Take 1 tablet by mouth every 6 (six) hours as needed for moderate pain.    Marland Kitchen insulin glargine (LANTUS) 100 UNIT/ML injection Inject 6 Units into the skin at bedtime.    Marland Kitchen lisinopril (PRINIVIL,ZESTRIL) 20 MG tablet Take 30 mg by mouth daily.     . Melatonin 5 MG TABS Take by mouth.    . penicillin v potassium (VEETID) 500 MG tablet Take 500 mg by mouth 4 (four) times daily.    Marland Kitchen Pimavanserin Tartrate (NUPLAZID) 17 MG TABS Take 2 tablets by mouth daily.    . Rivaroxaban (XARELTO) 15 MG TABS tablet Take 15 mg by mouth daily.    . rosuvastatin (CRESTOR) 10 MG tablet Take 10 mg by mouth daily.    . sertraline (ZOLOFT) 100 MG tablet Take 100 mg by mouth daily.       ROS:                                                                                                                                       History obtained from the patient  General ROS: negative for - chills, fatigue, fever, night sweats, weight gain or weight loss Psychological ROS: negative for - behavioral disorder, hallucinations, memory difficulties, mood swings or suicidal ideation Ophthalmic ROS: negative for - blurry vision, double vision, eye  pain or loss of vision ENT ROS: negative for - epistaxis, nasal discharge, oral lesions, sore throat, tinnitus or vertigo Allergy and Immunology ROS: negative for - hives or itchy/watery eyes Hematological and Lymphatic ROS: negative for - bleeding problems, bruising or swollen lymph nodes Endocrine ROS: negative for - galactorrhea, hair pattern changes, polydipsia/polyuria or temperature intolerance Respiratory ROS: negative for - cough, hemoptysis, shortness of breath or wheezing Cardiovascular ROS: negative for - chest pain, dyspnea on exertion, edema or irregular heartbeat Gastrointestinal ROS: negative for - abdominal pain, diarrhea, hematemesis, nausea/vomiting or stool incontinence Genito-Urinary ROS: negative for - dysuria, hematuria, incontinence or urinary frequency/urgency Musculoskeletal ROS: negative for - joint swelling or muscular weakness Neurological ROS: as noted in HPI Dermatological ROS: negative for rash and skin lesion changes  Neurologic Examination:  There were no vitals taken for this visit.  HEENT-  Normocephalic, no lesions, without obvious abnormality.  Normal external eye and conjunctiva.  Normal TM's bilaterally.  Normal auditory canals and external ears. Normal external nose, mucus membranes and septum.  Normal pharynx. Cardiovascular- irregularly irregular rhythm, pulses palpable throughout   Lungs- chest clear, no wheezing, rales, normal symmetric air entry, Heart exam - S1, S2 normal, no murmur, no gallop, rate regular Abdomen- normal findings: bowel sounds normal Extremities- no edema Lymph-no adenopathy palpable Musculoskeletal-no joint tenderness, deformity or swelling Skin-warm and dry, no hyperpigmentation, vitiligo, or suspicious lesions  Neurological Examination Mental Status: Alert, oriented to January but not age.  Speech dysarthric without evidence  of aphasia.  Able to follow simple commands without difficulty. Cranial Nerves: II: Discs flat bilaterally; Visual fields grossly normal,  III,IV, VI: ptosis not present, extra-ocular motions intact bilaterally but has a left gaze preference, pupils equal, round, reactive to light and accommodation V,VII: smile asymmetric on the right, facial light touch sensation decreased on the right VIII: hearing normal bilaterally IX,X: uvula rises symmetrically XI: bilateral shoulder shrug XII: midline tongue extension Motor: Moving all extremities antigravity but had drift on the right leg.  Sensory:decreased sensory on the right with neglect of right.  Deep Tendon Reflexes: 1+ and symmetric throughout Plantars: Right: downgoing   Left: downgoing Cerebellar: normal finger-to-nose,  Gait: not tested       Lab Results: Basic Metabolic Panel: Recent Labs  Lab 03/11/17 1330  NA 143  K 3.5  CL 107  GLUCOSE 150*  BUN 19  CREATININE 1.00    Liver Function Tests: No results for input(s): AST, ALT, ALKPHOS, BILITOT, PROT, ALBUMIN in the last 168 hours. No results for input(s): LIPASE, AMYLASE in the last 168 hours. No results for input(s): AMMONIA in the last 168 hours.  CBC: Recent Labs  Lab 03/11/17 1322 03/11/17 1330  WBC 10.6*  --   NEUTROABS 7.2  --   HGB 14.2 14.3  HCT 42.8 42.0  MCV 88.4  --   PLT 186  --     Cardiac Enzymes: No results for input(s): CKTOTAL, CKMB, CKMBINDEX, TROPONINI in the last 168 hours.  Lipid Panel: No results for input(s): CHOL, TRIG, HDL, CHOLHDL, VLDL, LDLCALC in the last 168 hours.  CBG: Recent Labs  Lab 03/11/17 1319  GLUCAP 153*    Microbiology: Results for orders placed or performed in visit on 07/03/14  Urine Culture     Status: None   Collection Time: 07/03/14  2:06 PM  Result Value Ref Range Status   Colony Count 8,000 COLONIES/ML  Final   Organism ID, Bacteria Insignificant Growth  Final    Coagulation Studies: No  results for input(s): LABPROT, INR in the last 72 hours.  Imaging: No results found.     Assessment and plan discussed with with attending physician and they are in agreement.    Felicie Morn PA-C [facility] [phone]  03/11/2017, 1:44 PM   Assessment: 84 y.o. male with new onset right sided weakness, facial droop, dysarthria and left gaze preference. Not a tPA candidate due to [facility]). Not able to obtain CTA due to allergy of dye.   Stroke Risk Factors - atrial fibrillation, diabetes mellitus, hyperlipidemia and hypertension   Recommend 1. HgbA1c, fasting lipid panel 2. MRI/MRA of the brain without contrast 3. PT consult, OT consult, Speech consult 4. Echocardiogram 5. 80 mg of Atorvistatin 6. Prophylactic therapy-Antiplatelet med: hold ASA and Xeralto until MRI has  been done 7. Risk factor modification 8. Telemetry monitoring 9. Frequent neuro checks 10 NPO until passes stroke swallow screen 11. Carotid Doppler  12. BP goal permissive HTN upto 220/110 systolic   NEUROHOSPITALIST ADDENDUM Seen and examined the patient today. Formulated plan as documented above by PAC/Resident. I agree with recommendations as above.  MRI Done shows, small patchy infarcts in left caudate - ok to resume Xarelto.   Georgiana SpinnerSushanth Aroor MD [facility] [phone]  If 7pm to 7am, please call on call as listed on AMION.

## 2017-03-11 NOTE — ED Notes (Signed)
Attempted report.  NS states charge RN unavailable as well.

## 2017-03-11 NOTE — ED Notes (Signed)
Baird LyonsCasey and Onalee HuaDavid attempting to transport pt and do bedside report.  Pt now trying to get out of bed and swinging at staff.

## 2017-03-11 NOTE — ED Notes (Addendum)
Attempted report.  This RN getting critical pt.  Another RN will attempt report.

## 2017-03-11 NOTE — ED Provider Notes (Addendum)
[facility] Note   CSN: [id number] Arrival date & time: 03/11/17  1317   An emergency department physician performed an initial assessment on this suspected stroke patient at 1318(Santosha Jividen).  History   Chief Complaint Chief Complaint  Patient presents with  . Code Stroke    HPI Darryl Carroll is a 84 y.o. male.  HPI  84 year old man from skilled nursing facility was last known normal 1215 presents today with report of right-sided weakness.  Patient does not give history.  All history is obtained from EMS.  Patient is on Xarelto.  Past Medical History:  Diagnosis Date  . CAD (coronary artery disease)    s/p multiple PCIs in all 3 vessels with DES. Last PCI 2005 with overlapping Cypher stents in CFX bifurcation lesion  . DM (diabetes mellitus), type 2 (HCC)   . Glaucoma   . Gout   . HTN (hypertension)    not under good control  . Hyperlipidemia   . Macular degeneration   . Obesity   . Osteoporosis   . Paroxysmal atrial fibrillation (HCC)   . Peptic ulcer disease   . Sleep apnea   . Spinal stenosis     Patient Active Problem List   Diagnosis Date Noted  . Prosthetic joint infection (HCC) 12/06/2011  . Long term (current) use of anticoagulants 06/06/2010  . PERIPHERAL VASCULAR INSUFFICIENCY,  LEGS, BILATERAL 08/15/2009  . DIABETES MELLITUS, TYPE II 08/14/2008  . HYPERLIPIDEMIA 08/14/2008  . GOUT 08/14/2008  . OBESITY 08/14/2008  . MACULAR DEGENERATION 08/14/2008  . GLAUCOMA 08/14/2008  . HYPERTENSION 08/14/2008  . CAD 08/14/2008  . FIBRILLATION, ATRIAL 08/14/2008  . PEPTIC ULCER DISEASE 08/14/2008  . SPINAL STENOSIS 08/14/2008  . OSTEOPOROSIS 08/14/2008  . SLEEP APNEA 08/14/2008    Past Surgical History:  Procedure Laterality Date  . Bilateral carpal tunnel surgery    . CARDIAC CATHETERIZATION     x4-5, PCIs  . Thyroglossal duct cyst removed in Jan or Feb 2005    . TURP VAPORIZATION         Home  Medications    Prior to Admission medications   Medication Sig Start Date End Date Taking? Authorizing Provider  atenolol (TENORMIN) 100 MG tablet Take 1 tablet (100 mg total) by mouth daily. Patient taking differently: Take 50 mg by mouth daily with breakfast.  08/30/13   Kathleene HazelMcAlhany, Christopher D, MD  Calcium Citrate-Vitamin D (CALCIUM + D PO) Take by mouth.    [provider]  Carbidopa-Levodopa ER (SINEMET CR) 25-100 MG tablet controlled release Take 1 tablet by mouth 3 (three) times daily.    [provider]  ciprofloxacin (CIPRO) 500 MG tablet Take 500 mg by mouth 2 (two) times daily.    [provider]  HYDROcodone-acetaminophen (NORCO/VICODIN) 5-325 MG tablet Take 1 tablet by mouth every 6 (six) hours as needed for moderate pain.    [provider]  insulin glargine (LANTUS) 100 UNIT/ML injection Inject 6 Units into the skin at bedtime.    [provider]  lisinopril (PRINIVIL,ZESTRIL) 20 MG tablet Take 30 mg by mouth daily.  10/06/14   [provider]  Melatonin 5 MG TABS Take by mouth.    [provider]  penicillin v potassium (VEETID) 500 MG tablet Take 500 mg by mouth 4 (four) times daily.    [provider]  Pimavanserin Tartrate (NUPLAZID) 17 MG TABS Take 2 tablets by mouth daily.    [provider]  Rivaroxaban (  XARELTO) 15 MG TABS tablet Take 15 mg by mouth daily.    [provider]  rosuvastatin (CRESTOR) 10 MG tablet Take 10 mg by mouth daily.    [provider]  sertraline (ZOLOFT) 100 MG tablet Take 100 mg by mouth daily.    [provider]    Family History Family History  Problem Relation Age of Onset  . Diabetes Mother        deceased 8  . Heart attack Father        deceased 94  . Heart attack Brother        deceased 46    Social History Social History   Tobacco Use  . Smoking status: Former Smoker    Types: Cigarettes    Last attempt to quit: 10/03/1983     Years since quitting: 33.4  . Smokeless tobacco: Never Used  Substance Use Topics  . Alcohol use: No    Alcohol/week: 0.0 oz  . Drug use: No     Allergies   Contrast media [iodinated diagnostic agents]   Review of Systems Review of Systems  Unable to perform ROS: Acuity of condition     Physical Exam Updated Vital Signs There were no vitals taken for this visit.  Physical Exam  Constitutional: He appears well-developed and well-nourished. No distress.  HENT:  Head: Normocephalic and atraumatic.  Right Ear: External ear normal.  Left Ear: External ear normal.  Mouth/Throat: Oropharynx is clear and moist.  Left eye is held closed  Eyes: EOM are normal. Pupils are equal, round, and reactive to light.  Neck: Normal range of motion. Neck supple.  Cardiovascular: An irregularly irregular rhythm present.  Pulmonary/Chest: Effort normal and breath sounds normal.  Abdominal: Soft. Bowel sounds are normal.  Musculoskeletal: Normal range of motion.  Neurological: He is alert. He displays normal reflexes. A cranial nerve deficit is present. No sensory deficit. He exhibits normal muscle tone. Coordination normal.  Oriented to person and place, not time Facial asymmetry   Skin: Skin is warm. Capillary refill takes less than 2 seconds.  Psychiatric: He has a normal mood and affect.  Nursing note and vitals reviewed.    ED Treatments / Results  Labs (all labs ordered are listed, but only abnormal results are displayed) Labs Reviewed  CBC - Abnormal; Notable for the following components:      Result Value   WBC 10.6 (*)    RDW 15.8 (*)    All other components within normal limits  DIFFERENTIAL - Abnormal; Notable for the following components:   Monocytes Absolute 1.1 (*)    All other components within normal limits  CBG MONITORING, ED - Abnormal; Notable for the following components:   Glucose-Capillary 153 (*)    All other components within normal limits  I-STAT CHEM  8, ED - Abnormal; Notable for the following components:   Glucose, Bld 150 (*)    Calcium, Ion 0.96 (*)    All other components within normal limits  PROTIME-INR  APTT  COMPREHENSIVE METABOLIC PANEL  I-STAT TROPONIN, ED    EKG  EKG Interpretation  Date/Time:  Wednesday March 11 2017 13:42:36 EST Ventricular Rate:  81 PR Interval:    QRS Duration: 139 QT Interval:  424 QTC Calculation: 493 R Axis:   8 Text Interpretation:  Atrial fibrillation Right bundle branch block Baseline wander in lead(s) II III aVF Confirmed by Margarita Grizzle [phone]) on 03/11/2017 1:58:45 PM       Radiology Ct Head  Code Stroke Wo Contrast  Result Date: 03/11/2017 CLINICAL DATA:  Code stroke.  Right-sided weakness.  Leftward gaze. EXAM: CT HEAD WITHOUT CONTRAST TECHNIQUE: Contiguous axial images were obtained from the base of the skull through the vertex without intravenous contrast. COMPARISON:  09/22/2014 FINDINGS: Brain: There is a new infarct in the medial left occipital lobe which is well-defined with some enlargement of the adjacent extra-axial CSF spaces most compatible with an old infarct. Mild lateral and third ventriculomegaly is unchanged and may reflect central predominant cerebral atrophy. Patchy to confluent hypodensities in the subcortical and deep cerebral white matter have progressed and are nonspecific but compatible with moderately advanced chronic small vessel ischemic disease. There is a new lacunar infarct in the anterior limb of the left internal capsule which is relatively well-defined. No definite acute cortically based infarct, intracranial hemorrhage, mass, midline shift, or extra-axial fluid collection is identified. Vascular: Calcified atherosclerosis at the skullbase. No hyperdense vessel. Skull: No fracture or focal osseous lesion. Sinuses/Orbits: Small left maxillary sinus mucous retention cyst. Clear mastoid air cells. Bilateral cataract extraction. Other: None. ASPECTS [facility] Stroke  Program Early CT Score) - Ganglionic level infarction (caudate, lentiform nuclei, internal capsule, insula, M1-M3 cortex): 7 - Supraganglionic infarction (M4-M6 cortex): 3 Total score (0-10 with 10 being normal): 10 IMPRESSION: 1. No acute intracranial hemorrhage or definite acute infarct. 2. New infarcts in the left occipital lobe and left internal capsule, both felt most likely to be chronic. 3. Moderately advanced chronic small vessel ischemic disease, progressed from 2016. 4. ASPECTS is 10 (considering the left internal capsule infarct as chronic). These results were communicated to Dr. Laurence Slate at 1:46 pmon 03/11/2017 by text page via the [facility] messaging system. Electronically Signed   By: Sebastian Ache M.D.   On: 03/11/2017 13:47    Procedures Procedures (including critical care time)  Medications Ordered in ED Medications - No data to display   Initial Impression / Assessment and Plan / ED Course  I have reviewed the triage vital signs and the nursing notes.  Pertinent labs & imaging results that were available during my care of the patient were reviewed by me and considered in my medical decision making (see chart for details).   Patient with reported sudden onset of right facial droop and right sided weakness.  Facial asymmetry on my exam but moving all extremities well.  Patient seen by stroke team as code stroke.  Symptoms appear to be improving.  Plan admission for further evaluation and treatment.    Final Clinical Impressions(s) / ED Diagnoses   Final diagnoses:  Facial droop  Atrial fibrillation, unspecified type [facility])    ED Discharge Orders    None       Margarita Grizzle, MD 03/11/17 1514    Margarita Grizzle, MD 03/11/17 [phone]

## 2017-03-11 NOTE — ED Notes (Signed)
Report from Dennie MaizesJessica Easley, RN.  Pt in MRI.

## 2017-03-11 NOTE — Code Documentation (Signed)
84yo male [facility] via [facility] at [phone].  Patient from facility where he was LKW at 1215.  Patient noted to have left gaze, right sided weakness and difficulty with speech by EMS and code stroke activated.  Stroke team at the bedside on patient arrival.  Labs drawn and patient cleared for CT by Dr. Rosalia Hammersay.  Patient with chronic atrial fibrillation on Xarelto, last dose last night per facility records.  Patient to CT with team.  CT completed.  NIHSS 12, see documentation for details and code stroke times.  Patient with left gaze preference, right facial droop, right leg drift, mild dysarthria and questionable right neglect on exam.  Patient is contraindicated for tPA d/t taking Xarelto.  Orders for STAT MRI/MRA per Dr. Laurence SlateAroor.  Patient to be admitted for stroke workup.  Bedside handoff with ED RN Shanda BumpsJessica.

## 2017-03-11 NOTE — ED Notes (Signed)
Pt to xray

## 2017-03-11 NOTE — H&P (Signed)
History and Physical    Darryl Carroll ZOX:[id number] DOB: 1927/08/24 DOA: 03/11/2017   PCP: Merlene Laughter, MD   Patient coming from:  Home    Chief Complaint: right sided weakness and right facial droop   HPI: Darryl Carroll is a 84 y.o. male with medical history significant for HTN, HLD, CKD, COPD, CAD status post multiple PCI's last with stent in 2005, history of atrial fibrillation on Xarelto gout, macular degeneration, history of peptic ulcer disease, history of sleep apnea, spinal stenosis, recently seen at the ED for gout flare, now brought from his skilled nursing facility, with acute right facial droop.  Last known normal at 12:15 p.m. initially, per chart report the patient was noted to have right-sided weakness, however at the time of evaluation, his right-sided weakness is not present. Patient  never had a similar episode. Denies any history of TIA. Denies vertigo dizziness, headaches, or dysphagia.  He has obvious dysarthria.  No acute confusion or seizures. Denies any chest pain, or shortness of breath. Denies any fever or chills, or night sweats. No tobacco. No new meds or hormonal supplements.  He is compliant with his medications.Denies any recent long distance trips or recent surgeries. No sick contacts.  Patient has not been active, essentially wheelchair bound which he pushes with his feet. Denies leg swelling or calf pain .Positive family history of stroke in father patient was not administered TPA as is on Xarelto   ED Course:  BP (!) 155/74   Pulse 83   Resp (!) 23   SpO2 100%   Code stroke was called. Neurology evaluation, Dr. Laurence Slate consulted on the patient, confirming dysarthric speech without evidence of aphasia, right facial droop, without any other neurological abnormalities. CT of the head code stroke without contrast showed no acute intracranial hemorrhage or definite acute infarct, there are new infarcts in the left occipital lobe and left internal capsule, both  felt most likely to be chronic however.  MRI of the brain is currently pending. No CT Angio of the neck was performed, as the patient is allergic to dye. Glucose 150 Creatinine 1 Troponin  0.03 on arrival, 0 now. Lactic acid 1.67 White count 10.6, hemoglobin 14.2, platelets 186 PT 15.6, INR 1.25, PTT 41 EKG  remarkable for atrial fibrillation, right bundle branch block   Review of Systems:  As per HPI otherwise all other systems reviewed and are negative  Past Medical History:  Diagnosis Date  . CAD (coronary artery disease)    s/p multiple PCIs in all 3 vessels with DES. Last PCI 2005 with overlapping Cypher stents in CFX bifurcation lesion  . DM (diabetes mellitus), type 2 (HCC)   . Glaucoma   . Gout   . HTN (hypertension)    not under good control  . Hyperlipidemia   . Macular degeneration   . Obesity   . Osteoporosis   . Paroxysmal atrial fibrillation (HCC)   . Peptic ulcer disease   . Sleep apnea   . Spinal stenosis     Past Surgical History:  Procedure Laterality Date  . Bilateral carpal tunnel surgery    . CARDIAC CATHETERIZATION     x4-5, PCIs  . Thyroglossal duct cyst removed in Jan or Feb 2005    . TURP VAPORIZATION      Social History Social History   Socioeconomic History  . Marital status: Married    Spouse name: Not on file  . Number of children: Not on file  . Years  of education: Not on file  . Highest education level: Not on file  Social Needs  . Financial resource strain: Not on file  . Food insecurity - worry: Not on file  . Food insecurity - inability: Not on file  . Transportation needs - medical: Not on file  . Transportation needs - non-medical: Not on file  Occupational History  . Not on file  Tobacco Use  . Smoking status: Former Smoker    Types: Cigarettes    Last attempt to quit: 10/03/1983    Years since quitting: 33.4  . Smokeless tobacco: Never Used  Substance and Sexual Activity  . Alcohol use: No    Alcohol/week: 0.0 oz    . Drug use: No  . Sexual activity: Not on file  Other Topics Concern  . Not on file  Social History Narrative  . Not on file     Allergies  Allergen Reactions  . Contrast Media [Iodinated Diagnostic Agents] Other (See Comments)    Reaction when stints put in    Family History  Problem Relation Age of Onset  . Diabetes Mother        deceased 14  . Heart attack Father        deceased 52  . Heart attack Brother        deceased 52      Prior to Admission medications   Medication Sig Start Date End Date Taking? Authorizing Provider  atenolol (TENORMIN) 100 MG tablet Take 1 tablet (100 mg total) by mouth daily. Patient taking differently: Take 50 mg by mouth daily with breakfast.  08/30/13   Kathleene Hazel, MD  Calcium Citrate-Vitamin D (CALCIUM + D PO) Take by mouth.    [provider]  Carbidopa-Levodopa ER (SINEMET CR) 25-100 MG tablet controlled release Take 1 tablet by mouth 3 (three) times daily.    [provider]  ciprofloxacin (CIPRO) 500 MG tablet Take 500 mg by mouth 2 (two) times daily.    [provider]  HYDROcodone-acetaminophen (NORCO/VICODIN) 5-325 MG tablet Take 1 tablet by mouth every 6 (six) hours as needed for moderate pain.    [provider]  insulin glargine (LANTUS) 100 UNIT/ML injection Inject 6 Units into the skin at bedtime.    [provider]  lisinopril (PRINIVIL,ZESTRIL) 20 MG tablet Take 30 mg by mouth daily.  10/06/14   [provider]  Melatonin 5 MG TABS Take by mouth.    [provider]  penicillin v potassium (VEETID) 500 MG tablet Take 500 mg by mouth 4 (four) times daily.    [provider]  Pimavanserin Tartrate (NUPLAZID) 17 MG TABS Take 2 tablets by mouth daily.    [provider]  Rivaroxaban (XARELTO) 15 MG TABS tablet Take 15 mg by mouth daily.    [provider]  rosuvastatin (CRESTOR) 10 MG tablet Take 10 mg by mouth daily.    [provider]  sertraline (ZOLOFT) 100 MG tablet Take 100 mg by mouth daily.    [provider]    Physical Exam:  Vitals:   03/11/17 1345 03/11/17 1400  BP: 133/89 (!) 155/74  Pulse: 80 83  Resp: (!) 24 (!) 23  SpO2: 100% 100%   Constitutional: NAD, calm, but ill-appearing. Eyes: Known left ptosis , normal extraocular motions, pupils equal ENMT: Mucous membranes are moist, without exudate or lesions  Neck: normal, supple, no masses, no thyromegaly Respiratory: clear to auscultation bilaterally, no wheezing, no crackles. Normal  respiratory effort  Cardiovascular irregularly irregular rate and rhythm,  murmur, rubs or gallops. No extremity edema. 2+ pedal pulses. No carotid bruits.  Abdomen: Soft, non tender, No hepatosplenomegaly. Bowel sounds positive.  Musculoskeletal: no clubbing / cyanosis. Moves all extremities Skin: no jaundice, No lesions.  Neurologic: Sensation intact  Alert and oriented  To person and place but not to date, R facial asymmetry . Known tremors, rigidity and known postural instability . Fluctuating ptosis due to prior herpetic infection, chronic     Labs on Admission: I have personally reviewed following labs and imaging studies  CBC: Recent Labs  Lab 03/11/17 1322 03/11/17 1330  WBC 10.6*  --   NEUTROABS 7.2  --   HGB 14.2 14.3  HCT 42.8 42.0  MCV 88.4  --   PLT 186  --     Basic Metabolic Panel: Recent Labs  Lab 03/11/17 1322 03/11/17 1330  NA 140 143  K 3.5 3.5  CL 107 107  CO2 24  --   GLUCOSE 145* 150*  BUN 17 19  CREATININE 1.03 1.00  CALCIUM 9.1  --     GFR: CrCl cannot be calculated (Unknown ideal weight.).  Liver Function Tests: Recent Labs  Lab 03/11/17 1322  AST 31  ALT 6*  ALKPHOS 70  BILITOT 0.8  PROT 6.0*  ALBUMIN 3.2*   No results for input(s): LIPASE, AMYLASE in the last 168 hours. No results for input(s): AMMONIA in the last 168 hours.  Coagulation Profile: Recent Labs  Lab 03/11/17 1322    INR 1.25    Cardiac Enzymes: No results for input(s): CKTOTAL, CKMB, CKMBINDEX, TROPONINI in the last 168 hours.  BNP (last 3 results) No results for input(s): PROBNP in the last 8760 hours.  HbA1C: No results for input(s): HGBA1C in the last 72 hours.  CBG: Recent Labs  Lab 03/11/17 1319  GLUCAP 153*    Lipid Profile: No results for input(s): CHOL, HDL, LDLCALC, TRIG, CHOLHDL, LDLDIRECT in the last 72 hours.  Thyroid Function Tests: No results for input(s): TSH, T4TOTAL, FREET4, T3FREE, THYROIDAB in the last 72 hours.  Anemia Panel: No results for input(s): VITAMINB12, FOLATE, FERRITIN, TIBC, IRON, RETICCTPCT in the last 72 hours.  Urine analysis:    Component Value Date/Time   COLORURINE YELLOW 10/23/2014 1120   APPEARANCEUR CLEAR 10/23/2014 1120   LABSPEC 1.023 10/23/2014 1120   PHURINE 6.0 10/23/2014 1120   GLUCOSEU 500 (A) 10/23/2014 1120   HGBUR NEGATIVE 10/23/2014 1120   BILIRUBINUR NEGATIVE 10/23/2014 1120   KETONESUR NEGATIVE 10/23/2014 1120   PROTEINUR 100 (A) 10/23/2014 1120   UROBILINOGEN 0.2 10/23/2014 1120   NITRITE NEGATIVE 10/23/2014 1120   LEUKOCYTESUR NEGATIVE 10/23/2014 1120    Sepsis Labs: @LABRCNTIP (procalcitonin:4,lacticidven:4) )No results found for this or any previous visit (from the past 240 hour(s)).   Radiological Exams on Admission: Ct Head Code Stroke Wo Contrast  Result Date: 03/11/2017 CLINICAL DATA:  Code stroke.  Right-sided weakness.  Leftward gaze. EXAM: CT HEAD WITHOUT CONTRAST TECHNIQUE: Contiguous axial images were obtained from the base of the skull through the vertex without intravenous contrast. COMPARISON:  09/22/2014 FINDINGS: Brain: There is a new infarct in the medial left occipital lobe which is well-defined with some enlargement of the adjacent extra-axial CSF spaces most compatible with an old infarct. Mild lateral and third ventriculomegaly is unchanged and may reflect central predominant cerebral atrophy. Patchy  to confluent hypodensities in the subcortical and deep cerebral white matter have progressed and are nonspecific but  compatible with moderately advanced chronic small vessel ischemic disease. There is a new lacunar infarct in the anterior limb of the left internal capsule which is relatively well-defined. No definite acute cortically based infarct, intracranial hemorrhage, mass, midline shift, or extra-axial fluid collection is identified. Vascular: Calcified atherosclerosis at the skullbase. No hyperdense vessel. Skull: No fracture or focal osseous lesion. Sinuses/Orbits: Small left maxillary sinus mucous retention cyst. Clear mastoid air cells. Bilateral cataract extraction. Other: None. ASPECTS [facility] Stroke Program Early CT Score) - Ganglionic level infarction (caudate, lentiform nuclei, internal capsule, insula, M1-M3 cortex): 7 - Supraganglionic infarction (M4-M6 cortex): 3 Total score (0-10 with 10 being normal): 10 IMPRESSION: 1. No acute intracranial hemorrhage or definite acute infarct. 2. New infarcts in the left occipital lobe and left internal capsule, both felt most likely to be chronic. 3. Moderately advanced chronic small vessel ischemic disease, progressed from 2016. 4. ASPECTS is 10 (considering the left internal capsule infarct as chronic). These results were communicated to Dr. Laurence Slate at 1:46 pmon 03/11/2017 by text page via the [facility] messaging system. Electronically Signed   By: Sebastian Ache M.D.   On: 03/11/2017 13:47     EKG: Independently reviewed.  Assessment/Plan Active Problems:   GOUT   Macular degeneration (senile) of retina   Essential hypertension   Coronary atherosclerosis   FIBRILLATION, ATRIAL   Peptic ulcer   Sleep apnea   Acute- R facial droop and dysarthria, no prior h/o CVA. RF include atrial fibrillation, diabetes, hyperlipidemia, hypertension, age, and a history of CAD.  Patient compliant with anticoagulants.  Neurology evaluation, Dr. Laurence Slate consulted on the  patient, confirming dysarthric speech without evidence of aphasia, right facial droop, without any other neurological abnormalities.  Initial presentation Modified Rankin score 3 .CT of the head code stroke without contrast showed no acute intracranial hemorrhage or definite acute infarct, there are new infarcts in the left occipital lobe and left internal capsule, both felt most likely to be chronic however.  MRI of the brain is currently pending. No CT Angio of the neck was performed, as the patient is allergic to dye.  Admit to Tele /observation Stroke order set  We will await for MRI/MRA of the brain report Bilateral carotid US ans the patient just returned from MRI ands MRA of the neck was performed, and the patient is allergic to dye Allow permissive HTN 2 D echo Echo  PT/OT/SLP  lipid panel Start atorvastatin 80 mg daily as recommended by Neuro A1C Continue Anticoagulation with Xarelto  Neuro  Following, appreciate involvement  N.p.o. until the patient passes a swallow screen.   Atrial Fibrillation CHA2DS2-VASc score 6 , on anticoagulation with Xarelto. Troponin  0.03 on arrival, 0 now.EKG Afib with RBBB  Rate controlled Continue meds  CAD,  multiple PCI procedures and has stents in all 3 vessels. His last PCI was in 2006 at which time he had overlapping stents placed in the circumflex artery.  patient is cardiac pain free at this time. 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 Gabriel Cirri per EDP  Admission status: Tele OBs    Marlowe Kays, PA-C [facility]   03/11/2017, 3:09 PM

## 2017-03-11 NOTE — ED Notes (Signed)
Pt transported to floor by Lesly Dukesasey W, RN and Onalee Huaavid, EMT

## 2017-03-11 NOTE — ED Triage Notes (Signed)
Pt arrives via [facility] as code stroke with left sided gaze and right sided weakness. Pt met by stroke team and taken to cT on arrival.

## 2017-03-11 NOTE — ED Notes (Signed)
Went to get pt a hospital bed to move him over for comfort and bed assigned.

## 2017-03-11 NOTE — ED Notes (Signed)
Pt transported to MRI 

## 2017-03-11 NOTE — ED Notes (Signed)
Attempted report x1. 

## 2017-03-11 NOTE — ED Notes (Addendum)
Dr. Ophelia CharterYates at bedside due to change in pt status.  Informed her pt has ready bed and attempting to get pt transported.  States pt is sundowning and she will order Haldol.

## 2017-03-12 ENCOUNTER — Observation Stay (HOSPITAL_COMMUNITY): Payer: Medicare Other

## 2017-03-12 ENCOUNTER — Encounter (HOSPITAL_COMMUNITY): Payer: Medicare Other

## 2017-03-12 ENCOUNTER — Observation Stay (HOSPITAL_BASED_OUTPATIENT_CLINIC_OR_DEPARTMENT_OTHER): Payer: Medicare Other

## 2017-03-12 DIAGNOSIS — I361 Nonrheumatic tricuspid (valve) insufficiency: Secondary | ICD-10-CM

## 2017-03-12 DIAGNOSIS — F05 Delirium due to known physiological condition: Secondary | ICD-10-CM | POA: Diagnosis not present

## 2017-03-12 DIAGNOSIS — F329 Major depressive disorder, single episode, unspecified: Secondary | ICD-10-CM | POA: Diagnosis present

## 2017-03-12 DIAGNOSIS — M4800 Spinal stenosis, site unspecified: Secondary | ICD-10-CM | POA: Diagnosis present

## 2017-03-12 DIAGNOSIS — F0280 Dementia in other diseases classified elsewhere without behavioral disturbance: Secondary | ICD-10-CM | POA: Diagnosis present

## 2017-03-12 DIAGNOSIS — I2510 Atherosclerotic heart disease of native coronary artery without angina pectoris: Secondary | ICD-10-CM | POA: Diagnosis not present

## 2017-03-12 DIAGNOSIS — G20 Parkinson's disease: Secondary | ICD-10-CM | POA: Diagnosis present

## 2017-03-12 DIAGNOSIS — I639 Cerebral infarction, unspecified: Secondary | ICD-10-CM | POA: Diagnosis present

## 2017-03-12 DIAGNOSIS — R1310 Dysphagia, unspecified: Secondary | ICD-10-CM | POA: Diagnosis present

## 2017-03-12 DIAGNOSIS — H409 Unspecified glaucoma: Secondary | ICD-10-CM | POA: Diagnosis present

## 2017-03-12 DIAGNOSIS — I129 Hypertensive chronic kidney disease with stage 1 through stage 4 chronic kidney disease, or unspecified chronic kidney disease: Secondary | ICD-10-CM | POA: Diagnosis present

## 2017-03-12 DIAGNOSIS — R29810 Facial weakness: Secondary | ICD-10-CM

## 2017-03-12 DIAGNOSIS — I6623 Occlusion and stenosis of bilateral posterior cerebral arteries: Secondary | ICD-10-CM | POA: Diagnosis present

## 2017-03-12 DIAGNOSIS — I4510 Unspecified right bundle-branch block: Secondary | ICD-10-CM | POA: Diagnosis present

## 2017-03-12 DIAGNOSIS — I480 Paroxysmal atrial fibrillation: Secondary | ICD-10-CM | POA: Diagnosis present

## 2017-03-12 DIAGNOSIS — I482 Chronic atrial fibrillation: Secondary | ICD-10-CM | POA: Diagnosis present

## 2017-03-12 DIAGNOSIS — N189 Chronic kidney disease, unspecified: Secondary | ICD-10-CM | POA: Diagnosis present

## 2017-03-12 DIAGNOSIS — R471 Dysarthria and anarthria: Secondary | ICD-10-CM | POA: Diagnosis present

## 2017-03-12 DIAGNOSIS — M109 Gout, unspecified: Secondary | ICD-10-CM | POA: Diagnosis present

## 2017-03-12 DIAGNOSIS — E785 Hyperlipidemia, unspecified: Secondary | ICD-10-CM | POA: Diagnosis present

## 2017-03-12 DIAGNOSIS — G8191 Hemiplegia, unspecified affecting right dominant side: Secondary | ICD-10-CM | POA: Diagnosis present

## 2017-03-12 DIAGNOSIS — H3530 Unspecified macular degeneration: Secondary | ICD-10-CM | POA: Diagnosis present

## 2017-03-12 DIAGNOSIS — J449 Chronic obstructive pulmonary disease, unspecified: Secondary | ICD-10-CM | POA: Diagnosis present

## 2017-03-12 DIAGNOSIS — E1122 Type 2 diabetes mellitus with diabetic chronic kidney disease: Secondary | ICD-10-CM | POA: Diagnosis present

## 2017-03-12 DIAGNOSIS — G4733 Obstructive sleep apnea (adult) (pediatric): Secondary | ICD-10-CM | POA: Diagnosis present

## 2017-03-12 LAB — GLUCOSE, CAPILLARY
Glucose-Capillary: 131 mg/dL — ABNORMAL HIGH (ref 65–99)
Glucose-Capillary: 123 mg/dL — ABNORMAL HIGH (ref 65–99)

## 2017-03-12 LAB — LIPID PANEL
CHOL/HDL RATIO: 6 ratio
CHOLESTEROL: 192 mg/dL (ref 0–200)
HDL: 32 mg/dL — ABNORMAL LOW (ref >40)
LDL Cholesterol: 121 mg/dL — ABNORMAL HIGH (ref 0–99)
TRIGLYCERIDES: 193 mg/dL — AB (ref <150)
VLDL: 39 mg/dL (ref 0–40)

## 2017-03-12 LAB — URINALYSIS, COMPLETE (UACMP) WITH MICROSCOPIC
BACTERIA UA: NONE SEEN
Bilirubin Urine: NEGATIVE
GLUCOSE, UA: NEGATIVE mg/dL
Hgb urine dipstick: NEGATIVE
KETONES UR: NEGATIVE mg/dL
Leukocytes, UA: NEGATIVE
Nitrite: NEGATIVE
PH: 6 (ref 5.0–8.0)
Protein, ur: NEGATIVE mg/dL
SPECIFIC GRAVITY, URINE: 1.012 (ref 1.005–1.030)

## 2017-03-12 LAB — ECHOCARDIOGRAM COMPLETE: Weight: 2049.4 oz

## 2017-03-12 MED ORDER — RESOURCE THICKENUP CLEAR PO POWD
ORAL | Status: DC | PRN
  Filled 2017-03-12: qty 125

## 2017-03-12 MED ORDER — ASPIRIN EC 81 MG PO TBEC
81.0000 mg | DELAYED_RELEASE_TABLET | Freq: Every day | ORAL | Status: DC
  Administered 2017-03-12 – 2017-03-13 (×2): 81 mg via ORAL
  Filled 2017-03-12 (×2): qty 1

## 2017-03-12 MED ORDER — ATORVASTATIN CALCIUM 40 MG PO TABS
40.0000 mg | ORAL_TABLET | Freq: Every day | ORAL | Status: DC
  Administered 2017-03-12 – 2017-03-13 (×2): 40 mg via ORAL
  Filled 2017-03-12 (×2): qty 1

## 2017-03-12 MED ORDER — HALOPERIDOL LACTATE 5 MG/ML IJ SOLN
1.0000 mg | Freq: Four times a day (QID) | INTRAMUSCULAR | Status: DC | PRN
  Administered 2017-03-12: 1 mg via INTRAMUSCULAR
  Filled 2017-03-12: qty 1

## 2017-03-12 MED ORDER — QUETIAPINE FUMARATE 25 MG PO TABS
12.5000 mg | ORAL_TABLET | Freq: Every day | ORAL | Status: DC
  Administered 2017-03-12: 12.5 mg via ORAL
  Filled 2017-03-12 (×2): qty 1

## 2017-03-12 NOTE — Progress Notes (Signed)
  Echocardiogram 2D Echocardiogram has been performed.  Camari Quintanilla T Kairee Isa 03/12/2017, 12:59 PM

## 2017-03-12 NOTE — Evaluation (Signed)
Physical Therapy Evaluation Patient Details Name: Darryl Carroll MRN: [id number] DOB: 12/09/1927 Today's Date: 03/12/2017   History of Present Illness  84 y.o. male presents from SNF with right facial droop and dysarthria. NIH score = 12. Brain MRI showed left caudate head decreased diffusion and chronic left occipital infarct. PMH significant of HTN, dementia, HLD, CKD, COPD, CAD s/p stent placement, A-fib, gout, macular degeneration, spinal stenosis, DM, and PD.   Clinical Impression  Pt presents with decreased strength, impaired cognition, decreased balance, impaired sensation, tightness of bilateral hamstrings, and impaired mobility secondary to above. Pt replies incorrectly most of the time during sensation testing of UE and LEs, indicating possible sensation loss or pointing to cognitive deficits. Pt is disoriented to situation, responding that he is in the hospital "because of his little sister". Pt requiring increased time for all mobility and to respond to questions. Pt closes eyes throughout session. Recommending discharge back to SNF, as pt requires increased level of assistance and previously requiring assistance as well. Pt will benefit from skilled PT acutely in order to improve upon aforementioned deficits. PT will follow.     Follow Up Recommendations SNF;Supervision/Assistance - 24 hour    Equipment Recommendations  None recommended by PT    Recommendations for Other Services       Precautions / Restrictions Precautions Precautions: Fall Restrictions Weight Bearing Restrictions: No      Mobility  Bed Mobility Overal bed mobility: Needs Assistance Bed Mobility: Rolling;Sidelying to Sit;Sit to Supine Rolling: Min assist Sidelying to sit: HOB elevated;Min assist   Sit to supine: Mod assist   General bed mobility comments: Pt requiring increased time and effort for bed mobility. Pt uses bed railing and initiates all bed mobility, however requires physical assist to  arrive in seated position at EOB.  Pt requiring assist to bring legs back onto bed and to scoot towards head of bed during sit to supine.   Transfers Overall transfer level: Needs assistance Equipment used: 2 person hand held assist Transfers: Sit to/from Stand Sit to Stand: +2 physical assistance;Mod assist         General transfer comment: Pt initiates sit-to-stand when instructed to stand from EOB, but requires mod assist +2 to power up, steady self, and maintain standing balance. Pt stands EOB for 1-2 minutes with VCs to stand up straight and utilize therapist hand-held assist for balance.   Ambulation/Gait                Stairs            Wheelchair Mobility    Modified Rankin (Stroke Patients Only) Modified Rankin (Stroke Patients Only) Pre-Morbid Rankin Score: Moderately severe disability Modified Rankin: Moderately severe disability     Balance Overall balance assessment: Needs assistance Sitting-balance support: Single extremity supported;Feet supported Sitting balance-Leahy Scale: Fair Sitting balance - Comments: PT able to sit EOB for short periods of time and maintain sitting balance without external support/LOB, however requires assist for weight shifting.    Standing balance support: Bilateral upper extremity supported Standing balance-Leahy Scale: Poor Standing balance comment: Pt requiring mod assist to maintain static standing balance.                              Pertinent Vitals/Pain Pain Assessment: No/denies pain    Home Living Family/patient expects to be discharged to:: Skilled nursing facility  Prior Function Level of Independence: Needs assistance   Gait / Transfers Assistance Needed: using w/c to get around prior to admission           Hand Dominance        Extremity/Trunk Assessment   Upper Extremity Assessment Upper Extremity Assessment: Defer to OT evaluation;Generalized  weakness    Lower Extremity Assessment Lower Extremity Assessment: Generalized weakness(impaired sensation - difficult to assess due to cognition)    Cervical / Trunk Assessment Cervical / Trunk Assessment: Kyphotic  Communication      Cognition Arousal/Alertness: Lethargic Behavior During Therapy: Flat affect Overall Cognitive Status: No family/caregiver present to determine baseline cognitive functioning Area of Impairment: Attention;Problem solving;Following commands;Orientation                 Orientation Level: Situation;Disoriented to Current Attention Level: Focused   Following Commands: Follows one step commands with increased time;Follows multi-step commands inconsistently     Problem Solving: Slow processing;Decreased initiation General Comments: Pt with limited verbalizations during session and requiring increased time to respond to questions. When asked why he was in the hospital, pt responds "because of my little sister". Pt inconsistent in his replies during sensation testing, indicating unreliable results. Unknown baseline cognitive status.       General Comments      Exercises     Assessment/Plan    PT Assessment Patient needs continued PT services  PT Problem List Decreased strength;Decreased balance;Decreased cognition;Impaired sensation;Decreased range of motion       PT Treatment Interventions DME instruction;Functional mobility training;Balance training;Patient/family education;Gait training;Therapeutic activities;Neuromuscular re-education;Retail buyer;Therapeutic exercise;Cognitive remediation    PT Goals (Current goals can be found in the Care Plan section)  Acute Rehab PT Goals Patient Stated Goal: unable PT Goal Formulation: Patient unable to participate in goal setting Time For Goal Achievement: 03/26/17 Potential to Achieve Goals: Fair    Frequency Min 3X/week   Barriers to discharge         Co-evaluation PT/OT/SLP Co-Evaluation/Treatment: Yes Reason for Co-Treatment: For patient/therapist safety;To address functional/ADL transfers PT goals addressed during session: Mobility/safety with mobility         AM-PAC PT "6 Clicks" Daily Activity  Outcome Measure Difficulty turning over in bed (including adjusting bedclothes, sheets and blankets)?: Unable Difficulty moving from lying on back to sitting on the side of the bed? : Unable Difficulty sitting down on and standing up from a chair with arms (e.g., wheelchair, bedside commode, etc,.)?: Unable Help needed moving to and from a bed to chair (including a wheelchair)?: A Lot Help needed walking in hospital room?: A Lot Help needed climbing 3-5 steps with a railing? : Total 6 Click Score: 8    End of Session Equipment Utilized During Treatment: Gait belt Activity Tolerance: Patient limited by fatigue Patient left: in bed;with call bell/phone within reach;with bed alarm set   PT Visit Diagnosis: Unsteadiness on feet (R26.81);Muscle weakness (generalized) (M62.81);Difficulty in walking, not elsewhere classified (R26.2)    Time: 1610-9604 PT Time Calculation (min) (ACUTE ONLY): 24 min   Charges:   PT Evaluation $PT Eval Moderate Complexity: 1 Mod     PT G Codes:        Reina Fuse, SPT  Reina Fuse 03/12/2017, 1:45 PM

## 2017-03-12 NOTE — Progress Notes (Signed)
Hospitalist progress note         RC AMISON  WUJ:[id number] DOB: 03-Mar-1928 DOA: 03/11/2017 PCP: Merlene Laughter, MD   Specialists:   Brief Narrative:  84 year old male known history A. 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  Admit 03/11/17 with facial droop and dysarthria with possible CVA CT showed chronic infarcts MRI shows patchy reduced diffusion left caudate head and body  Assessment & Plan:   Assessment:  The primary encounter diagnosis was Facial droop. Diagnoses of Atrial fibrillation, unspecified type (HCC) and Stroke (cerebrum) (HCC) were also pertinent to this visit.  New right-sided weakness facial droop and dysarthria-continue Xarelto risk stratification as per neurology-awaiting echo and carotids.  Neuro has added asa 81  A. fib chads score >3 on Xarelto-continue meds.  Allow permissive hypertension but rate control with Cardizem 120, monitor on telemetry Risk for dysphagia-slp placing on dys 3 diet-watch for cough with eat CAD with multivessel PCI last 1 2006-continue atenolol 100 daily restart later on today Diabetes mellitus type 2 sliding scale coverage sensitive-usually on metformin 500 a.m. prior prosthetic joint infection left shoulder-continue doxycycline 100 twice daily for suppressive therapy Hyperlipidemia Hypertension continue atenolol, Cardizem, lisinopril as above    DVT prophylaxis: xarelto Code Status: full Family Communication: called daughter nmo answer Lef tVMDisposition Plan: unclear   Consultants:   neuro  Procedures:   none  Antimicrobials:   none   Subjective: Awake alert not orietned in nad  Objective: Vitals:   03/12/17 0132 03/12/17 0155 03/12/17 0319 03/12/17 0501  BP: 131/84  (!) 156/86 (!) 162/89  Pulse: 99  (!) 35 79  Resp: 18  18 18   Temp:  98.4 F (36.9 C)  98.4 F (36.9 C) 98.4 F (36.9 C)  TempSrc: Axillary  Axillary Axillary  SpO2: 90%  92% 100%  Weight:  58.1 kg (128 lb 1.4 oz)      Intake/Output Summary (Last 24 hours) at 03/12/2017 0749 Last data filed at 03/12/2017 0612 Gross per 24 hour  Intake 825 ml  Output 300 ml  Net 525 ml   Filed Weights   03/12/17 0155  Weight: 58.1 kg (128 lb 1.4 oz)    Examination:  Frail small pupils cta b s1 s 2 nsr abd soft Smile symm LUE weaker than R--LLE and RLE same str Reflexes in Brachiorad same-slight brick-equivocal in KNees Shoulder shrug symm uvula and tongue midline  Data Reviewed: I have personally reviewed following labs and imaging studies  CBC: Recent Labs  Lab 03/11/17 1322 03/11/17 1330  WBC 10.6*  --   NEUTROABS 7.2  --   HGB 14.2 14.3  HCT 42.8 42.0  MCV 88.4  --   PLT 186  --    Basic Metabolic Panel: Recent Labs  Lab 03/11/17 1322 03/11/17 1330  NA 140 143  K 3.5 3.5  CL 107 107  CO2 24  --   GLUCOSE 145* 150*  BUN 17 19  CREATININE 1.03 1.00  CALCIUM 9.1  --    GFR: CrCl cannot be calculated (Unknown ideal weight.). Liver Function Tests: Recent Labs  Lab 03/11/17 1322  AST 31  ALT 6*  ALKPHOS 70  BILITOT 0.8  PROT 6.0*  ALBUMIN 3.2*   No results for input(s): LIPASE, AMYLASE in the last 168 hours. No results for input(s): AMMONIA in the last 168  hours. Coagulation Profile: Recent Labs  Lab 03/11/17 1322  INR 1.25   Cardiac Enzymes: No results for input(s): CKTOTAL, CKMB, CKMBINDEX, TROPONINI in the last 168 hours. CBG: Recent Labs  Lab 03/11/17 1319 03/11/17 2001 03/12/17 0641  GLUCAP 153* 154* 131*   Urine analysis:    Component Value Date/Time   COLORURINE YELLOW 03/12/2017 0111   APPEARANCEUR CLEAR 03/12/2017 0111   LABSPEC 1.012 03/12/2017 0111   PHURINE 6.0 03/12/2017 0111   GLUCOSEU NEGATIVE 03/12/2017 0111   HGBUR NEGATIVE 03/12/2017 0111   BILIRUBINUR NEGATIVE 03/12/2017 0111   KETONESUR  NEGATIVE 03/12/2017 0111   PROTEINUR NEGATIVE 03/12/2017 0111   UROBILINOGEN 0.2 10/23/2014 1120   NITRITE NEGATIVE 03/12/2017 0111   LEUKOCYTESUR NEGATIVE 03/12/2017 0111     Radiology Studies: Reviewed images personally in health database    Scheduled Meds: .  stroke: mapping our early stages of recovery book   Does not apply Once  . acyclovir  400 mg Oral BID  . atorvastatin  80 mg Oral q1800  . Carbidopa-Levodopa ER  1 tablet Oral TID  . cholecalciferol  1,000 Units Oral Daily  . diltiazem  120 mg Oral Daily  . doxycycline  100 mg Oral BID  . insulin aspart  0-9 Units Subcutaneous TID WC  . multivitamin with minerals  1 tablet Oral Daily  . Rivaroxaban  15 mg Oral QHS  . saccharomyces boulardii  250 mg Oral QHS  . sertraline  75 mg Oral Daily   Continuous Infusions: . sodium chloride 75 mL/hr at 03/12/17 0612     LOS: 0 days    Time spent: 5625    Pleas KochJai Orvill Coulthard, MD [facility](P) [phone]   If 7PM-7AM, please contact night-coverage [email or web address] Password [email or web address] 03/12/2017, 7:49 AM

## 2017-03-12 NOTE — Evaluation (Addendum)
Occupational Therapy Evaluation Patient Details Name: Darryl Carroll MRN: [id number] DOB: 07/09/1927 Today's Date: 03/12/2017    History of Present Illness 84 y.o. male presents from SNF with right facial droop and dysarthria. NIH score = 12. Brain MRI showed left caudate head decreased diffusion and chronic left occipital infarct. PMH significant of HTN, dementia, HLD, CKD, COPD, CAD s/p stent placement, A-fib, gout, macular degeneration, spinal stenosis, DM, and PD.    Clinical Impression   This 84 y/o M presents with the above. Pt resides in SNF, at baseline uses w/c for functional mobility, and per chart review Pt uses feet to assist with propelling w/c. Pt reports he receives assist for ADL completion. Pt presents with generalized weakness, decreased functional performance, increased fatigue. Pt sat EOB with close MinGuard for static balance, completed sit<>stand with ModA+2. Pt currently requires Max-total assist for LB ADLs, MinA-ModA for UB ADLs. Pt will benefit from continued acute OT services and recommend additional OT services in SNF setting after discharge to maximize Pt's safety and independence with ADLs and mobility.     Follow Up Recommendations  SNF;Supervision/Assistance - 24 hour    Equipment Recommendations  Other (comment)(defer to next venue )           Precautions / Restrictions Precautions Precautions: Fall Restrictions Weight Bearing Restrictions: No      Mobility Bed Mobility Overal bed mobility: Needs Assistance Bed Mobility: Rolling;Sidelying to Sit;Sit to Supine Rolling: Min assist Sidelying to sit: HOB elevated;Min assist   Sit to supine: Mod assist   General bed mobility comments: Pt requiring increased time and effort for bed mobility. Pt uses bed railing and initiates all bed mobility, however requires physical assist to arrive in seated position at EOB.  Pt requiring assist to bring legs back onto bed and to scoot towards head of bed during sit  to supine.   Transfers Overall transfer level: Needs assistance Equipment used: 2 person hand held assist Transfers: Sit to/from Stand Sit to Stand: +2 physical assistance;Mod assist         General transfer comment: Pt initiates sit-to-stand when instructed to stand from EOB, but requires mod assist +2 to power up, steady self, and maintain standing balance. Pt stands EOB for 1-2 minutes with VCs to stand up straight and utilize therapist hand-held assist for balance.     Balance Overall balance assessment: Needs assistance Sitting-balance support: Single extremity supported;Feet supported Sitting balance-Leahy Scale: Fair Sitting balance - Comments: PT able to sit EOB for short periods of time and maintain sitting balance without external support/LOB, however requires assist for weight shifting.    Standing balance support: Bilateral upper extremity supported Standing balance-Leahy Scale: Poor Standing balance comment: Pt requiring mod assist to maintain static standing balance.                            ADL either performed or assessed with clinical judgement   ADL Overall ADL's : Needs assistance/impaired Eating/Feeding: Minimal assistance;Sitting   Grooming: Wash/dry face;Min guard;Sitting Grooming Details (indicate cue type and reason): sitting EOB Upper Body Bathing: Minimal assistance;Sitting   Lower Body Bathing: Moderate assistance;+2 for physical assistance;+2 for safety/equipment;Sit to/from stand   Upper Body Dressing : Minimal assistance;Sitting   Lower Body Dressing: Total assistance;+2 for physical assistance;+2 for safety/equipment;Sit to/from stand       Toileting- Architect and Hygiene: Moderate assistance;+2 for physical assistance;+2 for safety/equipment;Sit to/from stand  Functional mobility during ADLs: Moderate assistance;+2 for physical assistance;+2 for safety/equipment(for sit<>stand ) General ADL Comments: Pt  completed bed mobility, sat EOB for majority of session with close minGuard, intermittent minA. Pt completed sit<>stand with ModA+2, maintaining static standing approx 1-2 min before requiring seated rest break; further mobility not completed at this time due to lab tech arriving for Echo, Pt returned to supine in bed      Vision Baseline Vision/History: Macular Degeneration(per chart review ) Additional Comments: difficult to assess due to baseline cognitive deficits and Pt with increased lethargy end of session during attempts at vision assessment                 Pertinent Vitals/Pain Pain Assessment: No/denies pain          Extremity/Trunk Assessment Upper Extremity Assessment Upper Extremity Assessment: Generalized weakness;LUE deficits/detail;RUE deficits/detail RUE Deficits / Details: strength grossly 3-/5 RUE Coordination: decreased fine motor;decreased gross motor LUE Deficits / Details: limited LUE AROM, Pt reporting baseline difficulties to do previous injuries; inconsistent report of sensation during testing LUE: Unable to fully assess due to pain LUE Coordination: decreased fine motor;decreased gross motor   Lower Extremity Assessment Lower Extremity Assessment: Defer to PT evaluation   Cervical / Trunk Assessment Cervical / Trunk Assessment: Kyphotic   Communication Communication Communication: Other (comment);No difficulties(slow to respond at times; suspect partly due to cognition and partly due to lethargy )   Cognition Arousal/Alertness: Lethargic Behavior During Therapy: Flat affect Overall Cognitive Status: No family/caregiver present to determine baseline cognitive functioning Area of Impairment: Attention;Problem solving;Following commands;Orientation                 Orientation Level: Situation;Disoriented to Current Attention Level: Focused   Following Commands: Follows one step commands with increased time;Follows multi-step commands  inconsistently     Problem Solving: Slow processing;Decreased initiation General Comments: Pt with limited verbalizations during session and requiring increased time to respond to questions. When asked why he was in the hospital, pt responds "because of my little sister". Pt inconsistent in his replies during sensation testing, indicating unreliable results. Unknown baseline cognitive status.    General Comments                  Home Living Family/patient expects to be discharged to:: Skilled nursing facility                                        Prior Functioning/Environment Level of Independence: Needs assistance  Gait / Transfers Assistance Needed: using w/c to get around prior to admission ADL's / Homemaking Assistance Needed: Pt reports receiving assist for bathing/dressing ADLs            OT Problem List: Decreased strength;Impaired balance (sitting and/or standing);Decreased cognition;Decreased range of motion;Decreased activity tolerance      OT Treatment/Interventions: Self-care/ADL training;DME and/or AE instruction;Balance training;Therapeutic activities;Therapeutic exercise;Energy conservation;Patient/family education    OT Goals(Current goals can be found in the care plan section) Acute Rehab OT Goals Patient Stated Goal: unable OT Goal Formulation: Patient unable to participate in goal setting Time For Goal Achievement: 03/26/17 Potential to Achieve Goals: Fair  OT Frequency: Min 2X/week               Co-evaluation PT/OT/SLP Co-Evaluation/Treatment: Yes Reason for Co-Treatment: For patient/therapist safety;To address functional/ADL transfers PT goals addressed during session: Mobility/safety with mobility  AM-PAC PT "6 Clicks" Daily Activity     Outcome Measure Help from another person eating meals?: A Little Help from another person taking care of personal grooming?: A Little Help from another person toileting, which  includes using toliet, bedpan, or urinal?: A Lot Help from another person bathing (including washing, rinsing, drying)?: A Lot Help from another person to put on and taking off regular upper body clothing?: A Little Help from another person to put on and taking off regular lower body clothing?: A Lot 6 Click Score: 15   End of Session Equipment Utilized During Treatment: Gait belt Nurse Communication: Mobility status  Activity Tolerance: Patient tolerated treatment well;Patient limited by lethargy Patient left: in bed;with call bell/phone within reach;with bed alarm set  OT Visit Diagnosis: Unsteadiness on feet (R26.81);Other symptoms and signs involving the nervous system (R29.898);Muscle weakness (generalized) (M62.81)                Time: 1610-9604 OT Time Calculation (min): 24 min Charges:  OT General Charges $OT Visit: 1 Visit OT Evaluation $OT Eval Moderate Complexity: 1 Mod G-Codes:     Marcy Siren, OT Pager [phone] 03/12/2017   Orlando Penner 03/12/2017, 2:53 PM

## 2017-03-12 NOTE — NC FL2 (Signed)
[place] MEDICAID FL2 LEVEL OF CARE SCREENING TOOL     IDENTIFICATION  Patient Name: Darryl Carroll Birthdate: 04/07/1927 Sex: male Admission Date (Current Location): 03/11/2017  [facility] [place]   Facility and Address:  [facility] [place] [address], [place], [place] [address]      Provider Number: [id number]  Attending Physician Name and Address:  Rhetta MuraSamtani, Jai-Gurmukh, MD  Relative Name and Phone Number:       Current Level of Care: Hospital Recommended Level of Care: Skilled Nursing Facility Prior Approval Number:    Date Approved/Denied:   PASRR Number: Pending; manual review  Discharge Plan: SNF    Current Diagnoses: Patient Active Problem List   Diagnosis Date Noted  . Stroke (cerebrum) (HCC) 03/11/2017  . Prosthetic joint infection (HCC) 12/06/2011  . Long term (current) use of anticoagulants 06/06/2010  . PERIPHERAL VASCULAR INSUFFICIENCY,  LEGS, BILATERAL 08/15/2009  . DIABETES MELLITUS, TYPE II 08/14/2008  . Hyperlipidemia 08/14/2008  . GOUT 08/14/2008  . OBESITY 08/14/2008  . Macular degeneration (senile) of retina 08/14/2008  . GLAUCOMA 08/14/2008  . Hypertension 08/14/2008  . Coronary atherosclerosis 08/14/2008  . FIBRILLATION, ATRIAL 08/14/2008  . Peptic ulcer 08/14/2008  . SPINAL STENOSIS 08/14/2008  . OSTEOPOROSIS 08/14/2008  . Sleep apnea 08/14/2008    Orientation RESPIRATION BLADDER Height & Weight     Self  Normal Incontinent Weight: 128 lb 1.4 oz (58.1 kg) Height:     BEHAVIORAL SYMPTOMS/MOOD NEUROLOGICAL BOWEL NUTRITION STATUS        Diet(carb modified)  AMBULATORY STATUS COMMUNICATION OF NEEDS Skin   Limited Assist Verbally Normal                       Personal Care Assistance Level of Assistance  Bathing, Feeding, Dressing Bathing Assistance: Limited assistance Feeding assistance: Limited assistance Dressing Assistance: Limited assistance     Functional Limitations Info   Sight, Speech, Hearing Sight Info: Adequate Hearing Info: Adequate Speech Info: Adequate    SPECIAL CARE FACTORS FREQUENCY  PT (By licensed PT), OT (By licensed OT), Speech therapy     PT Frequency: 5x/wk OT Frequency: 5x/wk     Speech Therapy Frequency: 5x/wk      Contractures Contractures Info: Not present    Additional Factors Info  Code Status, Allergies, Psychotropic, Insulin Sliding Scale Code Status Info: Full Allergies Info: Contrast Media Iodinated Diagnostic Agents, Zolpidem Psychotropic Info: Seroquel 12.5mg  daily at bed; Zoloft 75 mg daily Insulin Sliding Scale Info: 0-9 units 3x/day with meals       Current Medications (03/12/2017):  This is the current hospital active medication list Current Facility-Administered Medications  Medication Dose Route Frequency Provider Last Rate Last Dose  .  stroke: mapping our early stages of recovery book   Does not apply Once Wertman, Sung AmabileSara E, PA-C      . 0.9 %  sodium chloride infusion   Intravenous Continuous Marcos EkeWertman, Sara E, PA-C 75 mL/hr at 03/12/17 [id number]    . acetaminophen (TYLENOL) tablet 650 mg  650 mg Oral Q4H PRN Marcos EkeWertman, Sara E, PA-C       Or  . acetaminophen (TYLENOL) solution 650 mg  650 mg Per Tube Q4H PRN Marcos EkeWertman, Sara E, PA-C       Or  . acetaminophen (TYLENOL) suppository 650 mg  650 mg Rectal Q4H PRN Marcos EkeWertman, Sara E, PA-C      . acyclovir (ZOVIRAX) tablet 400 mg  400 mg Oral BID  Marlowe Kays E, PA-C   400 mg at 03/12/17 1046  . aspirin EC tablet 81 mg  81 mg Oral Daily Costello, Mary A, NP   81 mg at 03/12/17 1044  . atorvastatin (LIPITOR) tablet 40 mg  40 mg Oral q1800 Beryl Meager, NP      . Carbidopa-Levodopa ER (SINEMET CR) 25-100 MG tablet controlled release 1 tablet  1 tablet Oral TID Marcos Eke, PA-C   1 tablet at 03/12/17 1046  . cholecalciferol (VITAMIN D) tablet 1,000 Units  1,000 Units Oral Daily Jonah Blue, MD   1,000 Units at 03/12/17 1045  . diltiazem (CARDIZEM CD) 24 hr capsule 120  mg  120 mg Oral Daily Marlowe Kays E, PA-C   120 mg at 03/12/17 1045  . doxycycline (VIBRA-TABS) tablet 100 mg  100 mg Oral BID Marcos Eke, PA-C   100 mg at 03/12/17 1045  . insulin aspart (novoLOG) injection 0-9 Units  0-9 Units Subcutaneous TID WC Marcos Eke, PA-C   1 Units at 03/12/17 1226  . multivitamin with minerals tablet 1 tablet  1 tablet Oral Daily Jonah Blue, MD   1 tablet at 03/12/17 1045  . QUEtiapine (SEROQUEL) tablet 12.5 mg  12.5 mg Oral QHS Costello, Mary A, NP   12.5 mg at 03/12/17 1049  . RESOURCE THICKENUP CLEAR   Oral PRN Rhetta Mura, MD      . Rivaroxaban (XARELTO) tablet 15 mg  15 mg Oral QHS Wertman, Sara E, PA-C      . saccharomyces boulardii (FLORASTOR) capsule 250 mg  250 mg Oral QHS Wertman, Sara E, PA-C      . sertraline (ZOLOFT) tablet 75 mg  75 mg Oral Daily Marcos Eke, PA-C   75 mg at 03/12/17 1045     Discharge Medications: Please see discharge summary for a list of discharge medications.  Relevant Imaging Results:  Relevant Lab Results:   Additional Information SS#: [id number]  Baldemar Lenis, LCSW

## 2017-03-12 NOTE — Progress Notes (Addendum)
NEUROHOSPITALISTS STROKE TEAM - DAILY PROGRESS NOTE   ADMISSION HISTORY: Darryl Carroll is an 84 y.o. male brought to [facility] due to sudden onset of left  gaze preference and right facial droop.   He is on Xeralto which was last administered at yesterday thus not a tPA candidate. On arrival had dysarthria but no aphasia. Showed right sided weakness and decreased sensation. Due to dye allergy CTA was not obtained.   NIHS 12 Date last known well: Date: 03/11/2017 Time last known well: Time: 12:15 tPA Given: No: on AC Modified Rankin: Rankin Score=3  SUBJECTIVE (INTERVAL HISTORY) No family is at the bedside. Patient is found laying in bed in NAD. Overall he feels his condition is unchanged. Voices no new complaints. No new events reported overnight.   OBJECTIVE Lab Results: CBC:  Recent Labs  Lab 03/11/17 1322 03/11/17 1330  WBC 10.6*  --   HGB 14.2 14.3  HCT 42.8 42.0  MCV 88.4  --   PLT 186  --    BMP: Recent Labs  Lab 03/11/17 1322 03/11/17 1330  NA 140 143  K 3.5 3.5  CL 107 107  CO2 24  --   GLUCOSE 145* 150*  BUN 17 19  CREATININE 1.03 1.00  CALCIUM 9.1  --    Liver Function Tests:  Recent Labs  Lab 03/11/17 1322  AST 31  ALT 6*  ALKPHOS 70  BILITOT 0.8  PROT 6.0*  ALBUMIN 3.2*   Coagulation Studies:  Recent Labs    03/11/17 1322  APTT 41*  INR 1.25   PHYSICAL EXAM Temp:  [97.5 F (36.4 C)-98.4 F (36.9 C)] 97.9 F (36.6 C) (01/03 1339) Pulse Rate:  [35-99] 90 (01/03 1339) Resp:  [9-23] 20 (01/03 1339) BP: (108-178)/(59-106) 177/104 (01/03 1339) SpO2:  [90 %-100 %] 100 % (01/03 1339) Weight:  [58.1 kg (128 lb 1.4 oz)] 58.1 kg (128 lb 1.4 oz) (01/03 0155) General - Well nourished, well developed, in no apparent distress HEENT-  Normocephalic, Normal external eye/conjunctiva.  Normal external ears. Normal external nose, mucus membranes and septum.   Cardiovascular - Regular rate and  rhythm  Respiratory - Lungs clear bilaterally. No wheezing. Abdomen - soft and non-tender, BS normal Extremities- no edema or cyanosis Neurological Examination Mental Status: Alert, oriented to month, place and age.  Speech slightly dysarthric without evidence of aphasia.  Able to follow simple commands without difficulty. Cranial Nerves: II: Discs flat bilaterally; Visual fields grossly normal,  III,IV, VI: ptosis not present, extra-ocular motions intact bilaterally No left gaze preference noted on exam today, pupils equal, round, reactive to light and accommodation V,VII: smile asymmetric on the right, facial light touch sensation decreased on the right VIII: hearing normal bilaterally IX,X: uvula rises symmetrically XI: bilateral shoulder shrug XII: midline tongue extension Motor: Moving all extremities antigravity but had drift on the right leg.  Sensory:decreased sensory on the right with neglect of right.  Deep Tendon Reflexes: 1+ and symmetric throughout Plantars: Right: downgoing                                Left: downgoing Cerebellar: normal finger-to-nose,  Gait: not tested  IMAGING: I have personally reviewed the radiological images below and agree with the radiology interpretations.  MRI/ MRA Brain Wo Contrast Result Date: 03/11/2017 IMPRESSION: 1. Patchy reduced diffusion in left caudate head and body compatible with acute/early infarction. No hemorrhage or mass effect.  2. Normal MRA of the head. No large vessel occlusion, aneurysm, or significant stenosis is identified. 3. Moderate chronic microvascular ischemic changes and moderate parenchymal volume loss of the brain. 4. Small chronic left occipital infarction. Electronically Signed   By: Mitzi Hansen M.D.   On: 03/11/2017 15:26   Ct Head Code Stroke Wo Contrast Result Date: 03/11/2017 IMPRESSION: 1. No acute intracranial hemorrhage or definite acute infarct. 2. New infarcts in the left occipital lobe and  left internal capsule, both felt most likely to be chronic. 3. Moderately advanced chronic small vessel ischemic disease, progressed from 2016. 4. ASPECTS is 10 (considering the left internal capsule infarct as chronic). These results were communicated to Dr. Laurence Slate at 1:46 pmon 03/11/2017 by text page via the [facility] messaging system. Electronically Signed   By: Sebastian Ache M.D.   On: 03/11/2017 13:47   Echocardiogram:                                              PENDING B/L Carotid U/S:                                                PENDING    IMPRESSION: Mr. Darryl Carroll is a 84 y.o. male with PMH of AFIB on Xarelto with new onset right sided weakness, facial droop, dysarthria and left gaze preference. Not a tPA candidate due to [facility]). Not able to obtain CTA due to allergy of dye. MRI reveals:  Patchy reduced diffusion in left caudate head and body compatible with acute/early infarction.  Suspected Etiology: AFIB- possibly not therapeutic on Xarelto Resultant Symptoms: right sided weakness, facial droop, dysarthria and left gaze preference Stroke Risk Factors: atrial fibrillation, diabetes mellitus, hyperlipidemia and hypertension Other Stroke Risk Factors: Advanced age, CAD  Outstanding Stroke Work-up Studies:     Echocardiogram:                                                    PENDING B/L Carotid U/S:                                                     PENDING  03/12/2017 ASSESSMENT:   Right-sided weakness slowly improving.  Working with therapies.  Echocardiogram and carotid duplex pending.  Aspirin 81 mg added and Lipitor decreased to 40 mg daily.  Patient given Haldol overnight for sundowning.  Have discontinued this medication as it is not the best choice for patients with Parkinson's.  Added Seroquel 12.5 mg twice daily.  We will continue to closely monitor.  PLAN  03/12/2017: Continue Aspirin/ Xarelto/ Statin Frequent neuro checks Telemetry monitoring PT/OT/SLP Consult PM  & Rehab Consult Case Management /MSW Ongoing aggressive stroke risk factor management Patient counseled to be compliant with his antithrombotic medications Patient counseled on Lifestyle modifications including, Diet, Exercise, and Stress Follow up with [facility] in 6 weeks  DYSPHAGIA: Passed SLP swallow  evaluation with differential dysphasia 3 diet Aspiration Precautions in progress  AFIB, CHRONIC: Continue Xarelto  MEDICAL ISSUES: Parkinson's dementia and disease  Continue Sinemet  Follow up with Neurology as Outpatient  HYPERTENSION: Stable, some elevated blood pressures noted overnight Permissive hypertension (OK if <220/120) for 24-48 hours post stroke and then gradually  Long term BP goal normotensive. May slowly restart home B/P medications after 48 hours Home Meds: Lisinopril, diltiazem  HYPERLIPIDEMIA:    Component Value Date/Time   CHOL 192 03/12/2017 0545   TRIG 193 (H) 03/12/2017 0545   HDL 32 (L) 03/12/2017 0545   CHOLHDL 6.0 03/12/2017 0545   VLDL 39 03/12/2017 0545   LDLCALC 121 (H) 03/12/2017 0545  Home Meds:  NONE LDL  goal < 70 Started on  Lipitor to 40 mg daily Continue statin at discharge  DIABETES: Lab Results  Component Value Date   HGBA1C 6.6 (H) 03/11/2017  HgbA1c goal < 7.0  Other Active Problems: Active Problems:   Hyperlipidemia   GOUT   Macular degeneration (senile) of retina   Hypertension   Coronary atherosclerosis   FIBRILLATION, ATRIAL   Peptic ulcer   Sleep apnea   Stroke (cerebrum) [facility])    Hospital day # 0 VTE prophylaxis: Xarelto Diet : Diet Carb Modified Fluid consistency: Nectar Thick; Room service appropriate? Yes  FAMILY UPDATES: No family at bedside  TEAM UPDATES: Rhetta Mura, MD   Prior Home Stroke Medications:  Xarelto (rivaroxaban) daily  Discharge Stroke Meds:  Please discharge patient on aspirin 81 mg daily and Xarelto (rivaroxaban) daily   Disposition: 01-Home or Self  Care Therapy Recs:               SNF Home Equipment:         TBD Follow Up:  Follow-up Information    Tat, Octaviano Batty, DO. Schedule an appointment as soon as possible for a visit in 6 week(s).   Specialty:  Neurology Contact information: [address] [place]  [address] [place] [address] [phone]          Merlene Laughter, MD -PCP Follow up in 1-2 weeks     Assessment & plan discussed with with attending physician and they are in agreement.    Brita Romp Stroke Neurology Team 03/12/2017 2:25 PM  ATTENDING NOTE: I reviewed above note and agree with the assessment and plan. I have made any additions or clarifications directly to the above note. Pt was seen and examined.   84 year old male with history of CAD status post stent, DM, HTN, HLD, PAF on Xarelto, OSA, PD, PD dementia admitted for left gaze, right facial droop and right sided weakness.  CT showed remote left occipital, left IC and right frontal infarcts.  MRI showed acute left caudate head and body patchy punctate infarcts.  MRI showed bilateral PCA stenosis, right more than left.  EF 60-65%.  Carotid Doppler pending.  LDL 121, A1c 6.6.   Patient seems to have right hemianopia, not blinking to visual threat on the right, no gaze preference, able to attend both directions, no significant right hemiparesis except right deltoid 4+/5.  Facial symmetrical, however does have masked face with bradykinesia but no rigidity or resting tremor at this time.  Patient had a stroke despite on Xarelto. Need to investigate whether compliant with Xarelto. Will add baby aspirin to Xarelto for further stroke prevention.  Continue Lipitor.  Patient follows with Dr. Arbutus Leas at [facility]. Will follow.  Marvel Plan, MD PhD Stroke Neurology 03/12/2017  5:42 PM  To contact Stroke Continuity provider, please refer to [email or web address]. After hours, contact General Neurology

## 2017-03-12 NOTE — Evaluation (Signed)
Clinical/Bedside Swallow Evaluation Patient Details  Name: Darryl Carroll MRN: [id number] Date of Birth: 1927/03/19  Today's Date: 03/12/2017 Time: SLP Start Time (ACUTE ONLY): 1032 SLP Stop Time (ACUTE ONLY): 1117 SLP Time Calculation (min) (ACUTE ONLY): 45 min  Past Medical History:  Past Medical History:  Diagnosis Date  . CAD (coronary artery disease)    s/p multiple PCIs in all 3 vessels with DES. Last PCI 2005 with overlapping Cypher stents in CFX bifurcation lesion  . DM (diabetes mellitus), type 2 (HCC)   . Glaucoma   . Gout   . HTN (hypertension)    not under good control  . Hyperlipidemia   . Macular degeneration   . Obesity   . Osteoporosis   . Paroxysmal atrial fibrillation (HCC)   . Peptic ulcer disease   . Sleep apnea   . Spinal stenosis    Past Surgical History:  Past Surgical History:  Procedure Laterality Date  . Bilateral carpal tunnel surgery    . CARDIAC CATHETERIZATION     x4-5, PCIs  . Thyroglossal duct cyst removed in Jan or Feb 2005    . TURP VAPORIZATION     HPI:  84 yo male adm to [facility] with GOUT, found to have right sided gaze preference, right facial droop and decreased right sensation.  Pt MRI showed left caudate head decreased diffusion,  chronic left occipital cva.  Swallow and speech evaluation ordered as pt failed RNSSS.  Pt has h/o dysphagia diagnosed during hospital admit for pna - has h/o + aspiration of thin and penetration nectar *SILENT on MBS 2012.  Recommended pt have nectar with chin tuck.     Assessment / Plan / Recommendation Clinical Impression  Pt with h/o dysphagia and SILENT aspiration.  Today he presents with symptoms of aspiration with thin c/b overt strong coughing with 3 ounce water test.  Suspected delayed swallow suspected with possible premature spillage into pharynx/larynx.  Recommend dys3/nectar and allowing ice chips and tsps thin.  PT educated to premorbid findings of dysphagia upon admit in 08/2010 and reinforced  effective compensation strategies.  SlP will follow up for speech/language evaluation.  Of note, pt frequently repeats "I don't know" to questions and stated he would be 84 at his next birthday.  ? if pt has some baseline cognitive deficits?   SLP Visit Diagnosis: Dysphagia, oropharyngeal phase (R13.12)    Aspiration Risk  Moderate aspiration risk    Diet Recommendation Dysphagia 3 (Mech soft);Nectar-thick liquid;Other (Comment);Ice chips PRN after oral care(tsps water ok)   Liquid Administration via: Cup;Straw Medication Administration: Whole meds with puree Supervision: Full supervision/cueing for compensatory strategies Compensations: Slow rate;Small sips/bites;Chin tuck Postural Changes: Seated upright at 90 degrees;Remain upright for at least 30 minutes after po intake    Other  Recommendations Other Recommendations: Order thickener from pharmacy   Follow up Recommendations        Frequency and Duration min 2x/week  1 week       Prognosis Prognosis for Safe Diet Advancement: Fair Barriers to Reach Goals: Time post onset      Swallow Study   General Date of Onset: 03/12/17 HPI: 84 yo male adm to [facility] with GOUT, found to have right sided gaze preference, right facial droop and decreased right sensation.  Pt MRI showed left caudate head decreased diffusion,  chronic left occipital cva.  Swallow and speech evaluation ordered as pt failed RNSSS.  Pt has h/o dysphagia diagnosed during hospital admit for pna - has h/o +  aspiration of thin and penetration nectar *SILENT on MBS 2012.  Recommended pt have nectar with chin tuck.   Type of Study: Bedside Swallow Evaluation Diet Prior to this Study: NPO Temperature Spikes Noted: No Respiratory Status: Room air History of Recent Intubation: No Behavior/Cognition: Alert;Cooperative;Pleasant mood Oral Cavity Assessment: Within Functional Limits Oral Care Completed by SLP: No Oral Cavity - Dentition: Adequate natural dentition Vision:  Functional for self-feeding Self-Feeding Abilities: Able to feed self Patient Positioning: Upright in bed Baseline Vocal Quality: Normal Volitional Swallow: Able to elicit    Oral/Motor/Sensory Function Overall Oral Motor/Sensory Function: (slight lingual deviation to right upon protrusion)   Ice Chips Ice chips: Within functional limits Presentation: Spoon   Thin Liquid Thin Liquid: Impaired Presentation: Cup;Straw;Self Fed Oral Phase Functional Implications: Prolonged oral transit Pharyngeal  Phase Impairments: Cough - Delayed;Cough - Immediate;Suspected delayed Swallow Other Comments: immediate cough s/p post of thin with 3 ounce water test, reflexive cough despite small boluses with 40% of other boluses    Nectar Thick Nectar Thick Liquid: Impaired Presentation: Self Fed;Straw;Cup Oral phase functional implications: Prolonged oral transit Pharyngeal Phase Impairments: Suspected delayed Swallow   Honey Thick Honey Thick Liquid: Not tested   Puree Puree: Within functional limits Presentation: Self Fed;Spoon   Solid   GO   Solid: Impaired Presentation: Self Fed Pharyngeal Phase Impairments: Cough - Delayed Other Comments: pt with slow mastication, cough x1 of 7 boluses        Darryl Carroll, Darryl Carroll 03/12/2017,11:35 AM Donavan Burnetamara Siearra Amberg, MS [facility] SLP [phone]

## 2017-03-13 ENCOUNTER — Inpatient Hospital Stay (HOSPITAL_COMMUNITY): Payer: Medicare Other

## 2017-03-13 DIAGNOSIS — I639 Cerebral infarction, unspecified: Secondary | ICD-10-CM

## 2017-03-13 LAB — GLUCOSE, CAPILLARY
GLUCOSE-CAPILLARY: 112 mg/dL — AB (ref 65–99)
GLUCOSE-CAPILLARY: 172 mg/dL — AB (ref 65–99)
GLUCOSE-CAPILLARY: 111 mg/dL — AB (ref 65–99)
Glucose-Capillary: 147 mg/dL — ABNORMAL HIGH (ref 65–99)

## 2017-03-13 MED ORDER — RESOURCE THICKENUP CLEAR PO POWD: 1.0000 g | ORAL | Status: DC | PRN

## 2017-03-13 MED ORDER — ATORVASTATIN CALCIUM 40 MG PO TABS: 40.0000 mg | ORAL_TABLET | Freq: Every day | ORAL | 0 refills | Status: AC

## 2017-03-13 MED ORDER — ASPIRIN 81 MG PO TBEC: 81.0000 mg | DELAYED_RELEASE_TABLET | Freq: Every day | ORAL | 0 refills | Status: DC

## 2017-03-13 NOTE — Progress Notes (Signed)
  Speech Language Pathology Treatment: Dysphagia  Patient Details Name: Darryl Carroll MRN: [id number] DOB: 12/21/1927 Today's Date: 03/13/2017 Time: 1200-1210 SLP Time Calculation (min) (ACUTE ONLY): 10 min  Assessment / Plan / Recommendation Clinical Impression  Pt was seen for skilled ST targeting dysphagia goals.  Pt did not recall swallowing precautions and needed mod assist instructional cues to return demonstration of chin tuck when consuming nectar thick liquids via straw.  Pt had delayed coughing and audibly wet vocal quality without use of chin tuck; however, when using compensatory strategies overt s/s of aspiration were diminished.  Wife and son both reported that "as far as they know" pt had an unrestricted diet at SNF.  Pt consumed dys 3 solids a bit impulsively but not overtly unsafe.  iven acute CVA, would recommend that pt remain on current diet for now with SLP follow up for management of safe diet progression.  Pt left with family at bedside.  Continue per current plan of care.    HPI HPI: 84 yo male adm [facility] GOUT, found to have right sided gaze preference, right facial droop and decreased right sensation.  Pt MRI showed left caudate head decreased diffusion,  chronic left occipital cva.  Swallow and speech evaluation ordered as pt failed RNSSS.  Pt has h/o dysphagia diagnosed during hospital admit for pna - has h/o + aspiration of thin and penetration nectar *SILENT on MBS 2012.  Recommended pt have nectar with chin tuck.        SLP Plan  Continue with current plan of care       Recommendations  Diet recommendations: Dysphagia 3 (mechanical soft);Nectar-thick liquid Liquids provided via: Straw Medication Administration: Whole meds with puree Supervision: Patient able to self feed;Full supervision/cueing for compensatory strategies Compensations: Slow rate;Small sips/bites;Chin tuck Postural Changes and/or Swallow Maneuvers: Out of bed for meals;Seated upright 90  degrees;Upright 30-60 min after meal                Oral Care Recommendations: Oral care BID Follow up Recommendations: Skilled Nursing facility SLP Visit Diagnosis: Dysphagia, oropharyngeal phase (R13.12) Plan: Continue with current plan of care       GO                PageMelanee Spry, Konrad Hoak L 03/13/2017, 12:39 PM

## 2017-03-13 NOTE — Discharge Instructions (Signed)

## 2017-03-13 NOTE — Progress Notes (Signed)
Nurse attempted to call report. The nurse I spoke with said she was in a emergence and she would call me back.

## 2017-03-13 NOTE — Progress Notes (Signed)
NEUROHOSPITALISTS STROKE TEAM - DAILY PROGRESS NOTE   ADMISSION HISTORY: Darryl Carroll is an 84 y.o. male brought to [facility] due to sudden onset of left  gaze preference and right facial droop.   He is on Xeralto which was last administered at yesterday thus not a tPA candidate. On arrival had dysarthria but no aphasia. Showed right sided weakness and decreased sensation. Due to dye allergy CTA was not obtained.   NIHS 12 Date last known well: Date: 03/11/2017 Time last known well: Time: 12:15 tPA Given: No: on AC Modified Rankin: Rankin Score=3  SUBJECTIVE (INTERVAL HISTORY) No family is at the bedside. Patient is found laying in bed in NAD. Overall he feels his condition is unchanged. Voices no new complaints. No new events reported overnight.  No reports of sundowning overnight.   OBJECTIVE Lab Results: CBC:  Recent Labs  Lab 03/11/17 1322 03/11/17 1330  WBC 10.6*  --   HGB 14.2 14.3  HCT 42.8 42.0  MCV 88.4  --   PLT 186  --    BMP: Recent Labs  Lab 03/11/17 1322 03/11/17 1330  NA 140 143  K 3.5 3.5  CL 107 107  CO2 24  --   GLUCOSE 145* 150*  BUN 17 19  CREATININE 1.03 1.00  CALCIUM 9.1  --    Liver Function Tests:  Recent Labs  Lab 03/11/17 1322  AST 31  ALT 6*  ALKPHOS 70  BILITOT 0.8  PROT 6.0*  ALBUMIN 3.2*   Coagulation Studies:  Recent Labs    03/11/17 1322  APTT 41*  INR 1.25   PHYSICAL EXAM Temp:  [97.6 F (36.4 C)-98.4 F (36.9 C)] 98.2 F (36.8 C) (01/04 1325) Pulse Rate:  [52-96] 52 (01/04 1325) Resp:  [18-20] 20 (01/04 1325) BP: (117-168)/(74-118) 117/84 (01/04 1325) SpO2:  [97 %-100 %] 100 % (01/04 1325) Weight:  [57.7 kg (127 lb 3.3 oz)] 57.7 kg (127 lb 3.3 oz) (01/04 0533) General - Well nourished, well developed, in no apparent distress HEENT-  Normocephalic, Normal external eye/conjunctiva.  Normal external ears. Normal external nose, mucus membranes and septum.     Cardiovascular - Regular rate and rhythm  Respiratory - Lungs clear bilaterally. No wheezing. Abdomen - soft and non-tender, BS normal Extremities- no edema or cyanosis Neurological Examination Mental Status: Alert, oriented to month, place and age.  Speech slightly dysarthric without evidence of aphasia.  Able to follow simple commands without difficulty. Cranial Nerves: II: Discs flat bilaterally; Visual fields grossly normal,  III,IV, VI: ptosis not present, extra-ocular motions intact bilaterally No left gaze preference noted on exam today, pupils equal, round, reactive to light and accommodation V,VII: smile asymmetric on the right, facial light touch sensation decreased on the right VIII: hearing normal bilaterally IX,X: uvula rises symmetrically XI: bilateral shoulder shrug XII: midline tongue extension Motor: Moving all extremities antigravity but had drift on the right leg.  Sensory:decreased sensory on the right with neglect of right.  Deep Tendon Reflexes: 1+ and symmetric throughout Plantars: Right: downgoing                                Left: downgoing Cerebellar: normal finger-to-nose,  Gait: not tested  IMAGING: I have personally reviewed the radiological images below and agree with the radiology interpretations.  MRI/ MRA Brain Wo Contrast Result Date: 03/11/2017 IMPRESSION: 1. Patchy reduced diffusion in left caudate head and body compatible with  acute/early infarction. No hemorrhage or mass effect. 2. Normal MRA of the head. No large vessel occlusion, aneurysm, or significant stenosis is identified. 3. Moderate chronic microvascular ischemic changes and moderate parenchymal volume loss of the brain. 4. Small chronic left occipital infarction. Electronically Signed   By: Mitzi HansenLance  Furusawa-Stratton M.D.   On: 03/11/2017 15:26   Ct Head Code Stroke Wo Contrast Result Date: 03/11/2017 IMPRESSION: 1. No acute intracranial hemorrhage or definite acute infarct. 2. New  infarcts in the left occipital lobe and left internal capsule, both felt most likely to be chronic. 3. Moderately advanced chronic small vessel ischemic disease, progressed from 2016. 4. ASPECTS is 10 (considering the left internal capsule infarct as chronic). These results were communicated to Dr. Laurence SlateAroor at 1:46 pmon 03/11/2017 by text page via the [facility] messaging system. Electronically Signed   By: Sebastian AcheAllen  Grady M.D.   On: 03/11/2017 13:47   Echocardiogram:       Impressions: - LVEF 60-65%, mild LVH, normal wall motion, mild AI, trivial MR,   severe LAE, mild RVE with reduced systolic function, mild RAE,   mild TR, RVSP 28 mmHg, normal IVC                                          B/L Carotid U/S:      Bilateral 1-39% ICA stenosis, antegrade vertebral flow                                                IMPRESSION: 84 year old male with history of CAD status post stent, DM, HTN, HLD, PAF on Xarelto, OSA, PD, PD dementia admitted for left gaze, right facial droop and right sided weakness.  CT showed remote left occipital, left IC and right frontal infarcts.  MRI showed acute left caudate head and body patchy punctate infarcts.  MRI showed bilateral PCA stenosis, right more than left.  EF 60-65%. LDL 121, A1c 6.6.   Patient seems to have right hemianopia, not blinking to visual threat on the right, no gaze preference, able to attend both directions, no significant right hemiparesis except right deltoid 4+/5.  Facial symmetrical, however does have masked face with bradykinesia but no rigidity or resting tremor at this time.  Patient had a stroke despite on Xarelto. Need to investigate whether compliant with Xarelto. Will add baby aspirin to Xarelto for further stroke prevention.  Continue Lipitor.  [facility]   Patchy reduced diffusion in left caudate head and body compatible with acute/early infarction.  Suspected Etiology: AFIB- possibly not therapeutic on Xarelto Resultant  Symptoms: right sided weakness, facial droop, dysarthria and left gaze preference Stroke Risk Factors: atrial fibrillation, diabetes mellitus, hyperlipidemia and hypertension Other Stroke Risk Factors: Advanced age, CAD  Outstanding Stroke Work-up Studies:    Workup completed 03/13/2017 ASSESSMENT:   Neuro exam remained stable. Working with therapies.  Echocardiogram and carotid negative for acute findings.  Continue Xarelto, aspirin 81 mg, and Lipitor 40 mg daily.  No sundowning reported overnight.  Suggest continuation of Seroquel 12.5 mg twice daily.    PLAN  03/13/2017: Continue Aspirin/ Xarelto/ Statin Frequent neuro checks Telemetry monitoring PT/OT/SLP Consult PM & Rehab Consult Case Management /MSW Ongoing aggressive stroke risk factor management Patient counseled to  be compliant with his antithrombotic medications Patient counseled on Lifestyle modifications including, Diet, Exercise, and Stress Follow up with [facility] in 6 weeks  DYSPHAGIA: Passed SLP swallow evaluation with differential dysphasia 3 diet Aspiration Precautions in progress  AFIB, CHRONIC: Continue Xarelto  MEDICAL ISSUES: Parkinson's dementia and disease  Continue Sinemet  Follow up with Neurology as Outpatient  HYPERTENSION: Stable, some elevated blood pressures noted overnight Permissive hypertension (OK if <220/120) for 24-48 hours post stroke and then gradually  Long term BP goal normotensive. May slowly restart home B/P medications after 48 hours Home Meds: Lisinopril, diltiazem  HYPERLIPIDEMIA:    Component Value Date/Time   CHOL 192 03/12/2017 0545   TRIG 193 (H) 03/12/2017 0545   HDL 32 (L) 03/12/2017 0545   CHOLHDL 6.0 03/12/2017 0545   VLDL 39 03/12/2017 0545   LDLCALC 121 (H) 03/12/2017 0545  Home Meds:  NONE LDL  goal < 70 Started on  Lipitor to 40 mg daily Continue statin at discharge  DIABETES: Lab Results  Component Value Date   HGBA1C 6.6 (H) 03/11/2017    HgbA1c goal < 7.0  Other Active Problems: Active Problems:   Hyperlipidemia   GOUT   Macular degeneration (senile) of retina   Hypertension   Coronary atherosclerosis   FIBRILLATION, ATRIAL   Peptic ulcer   Sleep apnea   Stroke (cerebrum) [facility])    Hospital day # 1 VTE prophylaxis: Xarelto Diet : Diet Carb Modified Fluid consistency: Nectar Thick; Room service appropriate? Yes  FAMILY UPDATES: No family at bedside  TEAM UPDATES: Rhetta Mura, MD   Prior Home Stroke Medications:  Xarelto (rivaroxaban) daily  Discharge Stroke Meds:  Please discharge patient on aspirin 81 mg daily and Xarelto (rivaroxaban) daily   Disposition: 01-Home or Self Care Therapy Recs:               SNF Home Equipment:         TBD Follow Up:   Contact information for follow-up providers    Tat, Octaviano Batty, DO. Schedule an appointment as soon as possible for a visit in 6 week(s).   Specialty:  Neurology Contact information: [address] [place]  [address] [place] [address] [phone]            Contact information for after-discharge care    Destination    [facility] Follow up.   Service:  Skilled Nursing Contact information: [address] [place] [address] [phone]                 Merlene Laughter, MD -PCP Follow up in 1-2 weeks     Assessment & plan discussed with with attending physician and they are in agreement.    Beryl Meager, ANP-C Stroke Neurology Team 03/13/2017 2:00 PM  ATTENDING NOTE: I reviewed above note and agree with the assessment and plan. I have made any additions or clarifications directly to the above note. Pt was seen and examined.   84 year old male with history of CAD status post stent, DM, HTN, HLD, PAF on Xarelto, OSA, PD, PD dementia admitted for left gaze, right facial droop and right sided weakness.  CT showed remote left occipital, left IC and right frontal infarcts.  MRI showed acute left caudate  head and body patchy punctate infarcts.  MRI showed bilateral PCA stenosis, right more than left.  EF 60-65%.  Carotid Doppler unremarkable.  LDL 121, A1c 6.6.   Patient seems to have right hemianopia, not  blinking to visual threat on the right, no gaze preference, able to attend both directions, no significant right hemiparesis except right deltoid 4+/5.  Facial symmetrical, however does have masked face with bradykinesia but no rigidity or resting tremor at this time. Passed swallow on nectar thick liquid.  Patient had a stroke despite on Xarelto. Will add baby aspirin to Xarelto for further stroke prevention.  Continue Lipitor.  Patient follows with Dr. Arbutus Leas at [facility]. PT/OT recommend SNF.   Neurology will sign off. Please call with questions. Pt will follow up with Dr. Arbutus Leas at [facility] in about 6 weeks. Thanks for the consult.   Marvel Plan, MD PhD Stroke Neurology 03/14/2017 12:34 AM    To contact Stroke Continuity provider, please refer to [email or web address]. After hours, contact General Neurology

## 2017-03-13 NOTE — Care Management Note (Signed)
Case Management Note  Patient Details  Name: Glennon MacBobby W Oman MRN: [id number] Date of Birth: 08/04/1927  Subjective/Objective:     Pt admitted for stroke. He is from [facility].             Action/Plan: Plan is [facility]. CM following.   Expected Discharge Date:                  Expected Discharge Plan:  Skilled Nursing Facility  In-House Referral:  Clinical Social Work  Discharge planning Services     Post Acute Care Choice:    Choice offered to:     DME Arranged:    DME Agency:     HH Arranged:    HH Agency:     Status of Service:  In process, will continue to follow  If discussed at Long Length of Stay Meetings, dates discussed:    Additional Comments:  Kermit BaloKelli F Salvatore Poe, RN 03/13/2017, 2:52 PM

## 2017-03-13 NOTE — Evaluation (Signed)
Speech Language Pathology Evaluation Patient Details Name: Darryl Carroll MRN: [id number] DOB: [birth year] Today's Date: 03/13/2017 Time: 1210-1220 SLP Time Calculation (min) (ACUTE ONLY): 10 min  Problem List:  Patient Active Problem List   Diagnosis Date Noted  . Stroke (cerebrum) (HCC) 03/11/2017  . Prosthetic joint infection (HCC) 12/06/2011  . Long term (current) use of anticoagulants 06/06/2010  . PERIPHERAL VASCULAR INSUFFICIENCY,  LEGS, BILATERAL 08/15/2009  . DIABETES MELLITUS, TYPE II 08/14/2008  . Hyperlipidemia 08/14/2008  . GOUT 08/14/2008  . OBESITY 08/14/2008  . Macular degeneration (senile) of retina 08/14/2008  . GLAUCOMA 08/14/2008  . Hypertension 08/14/2008  . Coronary atherosclerosis 08/14/2008  . FIBRILLATION, ATRIAL 08/14/2008  . Peptic ulcer 08/14/2008  . SPINAL STENOSIS 08/14/2008  . OSTEOPOROSIS 08/14/2008  . Sleep apnea 08/14/2008   Past Medical History:  Past Medical History:  Diagnosis Date  . CAD (coronary artery disease)    s/p multiple PCIs in all 3 vessels with DES. Last PCI 2005 with overlapping Cypher stents in CFX bifurcation lesion  . DM (diabetes mellitus), type 2 (HCC)   . Glaucoma   . Gout   . HTN (hypertension)    not under good control  . Hyperlipidemia   . Macular degeneration   . Obesity   . Osteoporosis   . Paroxysmal atrial fibrillation (HCC)   . Peptic ulcer disease   . Sleep apnea   . Spinal stenosis    Past Surgical History:  Past Surgical History:  Procedure Laterality Date  . Bilateral carpal tunnel surgery    . CARDIAC CATHETERIZATION     x4-5, PCIs  . Thyroglossal duct cyst removed in Jan or Feb 2005    . TURP VAPORIZATION     HPI:  84 yo male adm to [facility] with GOUT, found to have right sided gaze preference, right facial droop and decreased right sensation.  Pt MRI showed left caudate head decreased diffusion,  chronic left occipital cva.  Swallow and speech evaluation ordered as pt failed RNSSS.  Pt has h/o  dysphagia diagnosed during hospital admit for pna - has h/o + aspiration of thin and penetration nectar *SILENT on MBS 2012.  Recommended pt have nectar with chin tuck.     Assessment / Plan / Recommendation Clinical Impression   Pt presents with moderately-severe cognitive impairment.  Pt was oriented to situation and date but not place, and according to staff members, pt has not been consistently oriented since admitted.  RN reports that other staff members have noted pt to be combative but no such behaviors were evident during her time with him nor during today's ST evaluation.  Pt has decreased recall of daily information, decreased safety awareness, and decreased intellectual/emergent awareness of his current limitations.  Pt reports his speech to be "slurred;" however, he is fully intelligible despite being flat and monotone.  He has limited initiation of functional communication but no overt word finding deficits are evident when conveying his immediate needs and wants to caregivers.  Suspect pt had baseline cognitive impairment given observed interactions between him and his family members although it is difficult to currently determine whether deficits today are exacerbated from baseline.  Pt would benefit from ongoing diagnostic treatment of cognitive-linguistic function while inpatient.    SLP Assessment  SLP Recommendation/Assessment: Patient needs continued Speech Lanaguage Pathology Services SLP Visit Diagnosis: Cognitive communication deficit (R41.841)    Follow Up Recommendations  Skilled Nursing facility    Frequency and Duration min 2x/week  SLP Evaluation Cognition  Overall Cognitive Status: History of cognitive impairments - at baseline Arousal/Alertness: Awake/alert Orientation Level: Oriented to person;Disoriented to place;Oriented to time;Oriented to situation Attention: Sustained Sustained Attention: Impaired Sustained Attention Impairment: Functional  basic;Verbal basic Memory: Impaired Memory Impairment: Retrieval deficit Awareness: Impaired Awareness Impairment: Emergent impairment Problem Solving: Appears intact Safety/Judgment: Impaired       Comprehension  Auditory Comprehension Overall Auditory Comprehension: Appears within functional limits for tasks assessed    Expression Expression Primary Mode of Expression: Verbal Verbal Expression Overall Verbal Expression: Appears within functional limits for tasks assessed   Oral / Motor  Oral Motor/Sensory Function Overall Oral Motor/Sensory Function: Within functional limits Motor Speech Overall Motor Speech: Appears within functional limits for tasks assessed   GO                    Darryl Carroll, Darryl Carroll 03/13/2017, 2:16 PM

## 2017-03-13 NOTE — Clinical Social Work Note (Signed)
Clinical Social Work Assessment  Patient Details  Name: Darryl Carroll MRN: [id number] Date of Birth: 01/01/1928  Date of referral:  03/12/17               Reason for consult:  Facility Placement                Permission sought to share information with:  Facility Medical sales representativeContact Representative, Family Supports Permission granted to share information::  Yes, Verbal Permission Granted  Name::     Special educational needs teacherMartha  Agency::  [facility]  Relationship::  Wife  Contact Information:     Housing/Transportation Living arrangements for the past 2 months:  Skilled Building surveyorursing Facility Source of Information:  Adult Children Patient Interpreter Needed:  None Criminal Activity/Legal Involvement Pertinent to Current Situation/Hospitalization:  No - Comment as needed Significant Relationships:  Spouse Lives with:  Self, Facility Resident Do you feel safe going back to the place where you live?  Yes Need for family participation in patient care:  Yes (Comment)(patient not oriented)  Care giving concerns:  [facility] and family has no concerns about care received.   Social Worker assessment / plan:  CSW spoke with patient's wife over the phone to discuss discharge plan. CSW confirmed with [facility] that patient was a resident and could return at discharge. CSW completed referral and will continue to follow.  Employment status:  Retired Database administratornsurance information:  Managed Medicare PT Recommendations:  Skilled Nursing Facility Information / Referral to community resources:  Skilled Nursing Facility  Patient/Family's Response to care:  Patient's wife agreeable to return to SNF placement.  Patient/Family's Understanding of and Emotional Response to Diagnosis, Current Treatment, and Prognosis:  Patient's wife indicated that the patient had [facility] and the care over there has been excellent. Patient's wife discussed how she likes that it's also close to where  she lives so she can visit and check on him regularly.   Emotional Assessment Appearance:  Appears stated age Attitude/Demeanor/Rapport:  Unable to Assess Affect (typically observed):  Unable to Assess Orientation:  Oriented to Self Alcohol / Substance use:  Not Applicable Psych involvement (Current and /or in the community):  No (Comment)  Discharge Needs  Concerns to be addressed:  Care Coordination Readmission within the last 30 days:  No Current discharge risk:  Physical Impairment, Cognitively Impaired, Dependent with Mobility Barriers to Discharge:  Continued Medical Work up, Pension scheme managerAwaiting State Approval ([place]), Insurance Authorization   Baldemar Lenislizabeth M Haliyah Fryman, [place] 03/13/2017, 9:25 AM

## 2017-03-13 NOTE — Progress Notes (Signed)
*  PRELIMINARY RESULTS* Vascular Ultrasound Carotid Duplex (Doppler) has been completed.  Preliminary findings: Bilateral 1-39% ICA stenosis, antegrade vertebral flow.   Darryl FischerCharlotte C Byron Carroll 03/13/2017, 2:56 PM

## 2017-03-13 NOTE — Progress Notes (Signed)
Discharge to: [facility] Anticipated discharge date: 03/13/17 Family notified: Yes, by phone Transportation by: PTAR  Report #: [id number]  Of note: CSW contacted [facility]missions [facility], who indicated that per the DON, facility will not accept patient if he arrives later than 5 pm. CSW contacted CSW AD who explained that the facility will have to take him back regardless of what time he gets there, because he is their resident. CSW provided information to facility admissions and indicated that if the facility had any questions or concerns, they could contact the CSW AD.  CSW signing off.  Blenda Nicelylizabeth Mindi Akerson LCSW [phone]

## 2017-03-13 NOTE — Discharge Summary (Signed)
Physician Discharge Summary  [name]:[id number] DOB: 21-Aug-1927 DOA: 03/11/2017  PCP: Merlene Laughter, MD  Admit date: 03/11/2017 Discharge date: 03/13/2017  Time spent: 25 minutes  Recommendations for Outpatient Follow-up:  1. Continue all meds --added aspirin this admit to Xarelto 2. Would recommned OP goals of care  Discharge Diagnoses:  Active Problems:   Hyperlipidemia   GOUT   Macular degeneration (senile) of retina   Hypertension   Coronary atherosclerosis   FIBRILLATION, ATRIAL   Peptic ulcer   Sleep apnea   Stroke (cerebrum) [facility])   Discharge Condition:  improved  Diet recommendation: hh low salt-dys 3 diet  Filed Weights   03/12/17 0155 03/13/17 0533  Weight: 58.1 kg (128 lb 1.4 oz) 57.7 kg (127 lb 28.74 oz)   84 year old male known history A. 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  Admit 03/11/17 with facial droop and dysarthria with possible CVA CT showed chronic infarcts MRI shows patchy reduced diffusion left caudate Carroll and body  History of present illness:  New right-sided weakness facial droop and dysarthria-continue Xarelto risk stratification as per neurology-echo neg for any cocner-carotids neg  Neuro has added asa 8-will need SNF level care on d/c A. fib chads score >3 on Xarelto-continue meds.  Allow permissive hypertension but rate control with Cardizem 120, monitor on telemetry--stable during hosptial stay Risk for dysphagia-slp placing on dys 3 diet-watch for cough with eat CAD with multivessel PCI last 1 2006-continue atenolol 100 daily restarted Diabetes mellitus type 2 sliding scale coverage sensitive-usually on metformin 500 a.m.--resumed on d/c prior prosthetic joint infection left shoulder-continue doxycycline 100 twice daily for suppressive  therapy Hyperlipidemia Hypertension continue atenolol, Cardizem, lisinopril as above  Consultations:  Neuro  Discharge Exam: Vitals:   03/13/17 0533 03/13/17 0552  BP: (!) 168/118 122/79  Pulse: 82 69  Resp: 18   Temp: 98 F (36.7 C)   SpO2: 98%     General:  Awake alert more oriented and in and Cardiovascular:  s1 s2 no m/r/g Respiratory: clear moving 4 limbs equally Neuro improved str-still str deficits on the R UE but improved form prior  Discharge Instructions   Discharge Instructions    Ambulatory referral to Neurology   Complete by:  As directed    An appointment is requested in approximately: 6 weeks     Allergies as of 03/13/2017      Reactions   Contrast Media [iodinated Diagnostic Agents] Other (See Comments)   Reaction when stints put in   Zolpidem Other (See Comments)   unknown      Medication List    TAKE these medications   acetaminophen 325 MG tablet Commonly known as:  TYLENOL Take 650 mg by mouth every 6 (six) hours.   acyclovir 400 MG tablet Commonly known as:  ZOVIRAX Take 400 mg by mouth 2 (two) times daily.   aspirin 81 MG EC tablet Take 1 tablet (81 mg total) by mouth daily. Start taking on:  03/14/2017   atenolol 100 MG tablet Commonly known as:  TENORMIN Take 1 tablet (100 mg total) by mouth daily. What changed:  when to take this   atorvastatin 40 MG tablet Commonly known as:  LIPITOR Take 1 tablet (40 mg total) by mouth daily at 6 PM.   Carbidopa-Levodopa ER 25-100 MG tablet controlled release Commonly known as:  SINEMET CR Take 1 tablet by mouth 3 (three) times daily.   diltiazem 120 MG 24 hr capsule Commonly known as:  CARDIZEM CD Take 120 mg by mouth daily.   doxycycline 100 MG capsule Commonly known as:  VIBRAMYCIN Take 100 mg by mouth 2 (two) times daily.   lisinopril 40 MG tablet Commonly known as:  PRINIVIL,ZESTRIL Take 40 mg by mouth at bedtime.   metFORMIN 500 MG tablet Commonly known as:   GLUCOPHAGE Take 500 mg by mouth daily with breakfast.   multivitamin with minerals tablet Take 1 tablet by mouth daily.   RESOURCE THICKENUP CLEAR Powd Take 1 g by mouth as needed.   Rivaroxaban 15 MG Tabs tablet Commonly known as:  XARELTO Take 15 mg by mouth at bedtime.   saccharomyces boulardii 250 MG capsule Commonly known as:  FLORASTOR Take 250 mg by mouth at bedtime.   sertraline 25 MG tablet Commonly known as:  ZOLOFT Take 75 mg by mouth daily.   Vitamin D-3 1000 units Caps Take 1,000 Units by mouth daily.      Allergies  Allergen Reactions  . Contrast Media [Iodinated Diagnostic Agents] Other (See Comments)    Reaction when stints put in  . Zolpidem Other (See Comments)    unknown    Contact information for follow-up providers    Tat, Octaviano Batty, DO. Schedule an appointment as soon as possible for a visit in 6 week(s).   Specialty:  Neurology Contact information: [address] [place]  [address] [place] [address] [phone]            Contact information for after-discharge care    Destination    [facility] Follow up.   Service:  Skilled Nursing Contact information: [address] [place] [address] [phone]                   The results of significant diagnostics from this hospitalization (including imaging, microbiology, ancillary and laboratory) are listed below for reference.    Significant Diagnostic Studies: Dg Chest 2 View  Result Date: 03/11/2017 CLINICAL DATA:  Stroke.  Left-sided weakness. EXAM: CHEST  2 VIEW COMPARISON:  10/23/2014 FINDINGS: The cardiac silhouette appears mildly enlarged. Thoracic aortic tortuosity and atherosclerotic calcification are noted. No airspace consolidation, edema, pleural effusion, pneumothorax is identified. A left reverse total shoulder arthroplasty, chronic thoracic and upper lumbar compression fractures, and chronic left rib fractures are noted. IMPRESSION: No  active cardiopulmonary disease. Electronically Signed   By: Sebastian Ache M.D.   On: 03/11/2017 17:25   Mr Darryl Carroll Carroll Contrast  Result Date: 03/11/2017 CLINICAL DATA:  84 y/o M; left gaze preference, right facial droop, right-sided weakness, right-sided decreased sensation. EXAM: MRI Carroll WITHOUT CONTRAST MRA Carroll WITHOUT CONTRAST TECHNIQUE: Multiplanar, multiecho pulse sequences of the Darryl and surrounding structures were obtained without intravenous contrast. Angiographic images of the Carroll were obtained using MRA technique without contrast. COMPARISON:  03/11/2017 CT Carroll FINDINGS: MRI Carroll FINDINGS Darryl: Patchy reduced diffusion within the left caudate Carroll and body (series 3 image 29 and 32). No associated hemorrhage or mass effect. Patchy nonspecific foci of T2 FLAIR hyperintense signal abnormality in subcortical and periventricular white matter are compatible with moderate chronic microvascular ischemic changes for age. Moderate Darryl parenchymal volume loss. No hydrocephalus, extra-axial collection, or effacement of basilar cisterns. No mass. Small chronic hemosiderin stained infarction of the left occipital lobe. Punctate foci of susceptibility hypointensity are present in right occipital and parietal lobes  compatible hemosiderin deposition of chronic microhemorrhage. Vascular: As below. Skull and upper cervical spine: Normal marrow signal. Sinuses/Orbits: Small mucous retention cyst of left maxillary sinus. Bilateral intra-ocular lens replacement. Otherwise negative. Other: None. MRA Carroll FINDINGS Internal carotid arteries:  Patent. Anterior cerebral arteries:  Patent. Middle cerebral arteries: Patent. Anterior communicating artery: Patent. Posterior communicating arteries: Not identified, likely hypoplastic or absent. Posterior cerebral arteries:  Patent. Basilar artery:  Patent. Vertebral arteries:  Patent. No evidence of high-grade stenosis, large vessel occlusion, or aneurysm unless noted above.  IMPRESSION: 1. Patchy reduced diffusion in left caudate Carroll and body compatible with acute/early infarction. No hemorrhage or mass effect. 2. Normal MRA of the Carroll. No large vessel occlusion, aneurysm, or significant stenosis is identified. 3. Moderate chronic microvascular ischemic changes and moderate parenchymal volume loss of the Darryl. 4. Small chronic left occipital infarction. Electronically Signed   By: Mitzi HansenLance  Furusawa-Stratton M.D.   On: 03/11/2017 15:26   Mr Darryl Carroll Contrast  Result Date: 03/11/2017 CLINICAL DATA:  84 y/o M; left gaze preference, right facial droop, right-sided weakness, right-sided decreased sensation. EXAM: MRI Carroll WITHOUT CONTRAST MRA Carroll WITHOUT CONTRAST TECHNIQUE: Multiplanar, multiecho pulse sequences of the Darryl and surrounding structures were obtained without intravenous contrast. Angiographic images of the Carroll were obtained using MRA technique without contrast. COMPARISON:  03/11/2017 CT Carroll FINDINGS: MRI Carroll FINDINGS Darryl: Patchy reduced diffusion within the left caudate Carroll and body (series 3 image 29 and 32). No associated hemorrhage or mass effect. Patchy nonspecific foci of T2 FLAIR hyperintense signal abnormality in subcortical and periventricular white matter are compatible with moderate chronic microvascular ischemic changes for age. Moderate Darryl parenchymal volume loss. No hydrocephalus, extra-axial collection, or effacement of basilar cisterns. No mass. Small chronic hemosiderin stained infarction of the left occipital lobe. Punctate foci of susceptibility hypointensity are present in right occipital and parietal lobes compatible hemosiderin deposition of chronic microhemorrhage. Vascular: As below. Skull and upper cervical spine: Normal marrow signal. Sinuses/Orbits: Small mucous retention cyst of left maxillary sinus. Bilateral intra-ocular lens replacement. Otherwise negative. Other: None. MRA Carroll FINDINGS Internal carotid arteries:  Patent. Anterior  cerebral arteries:  Patent. Middle cerebral arteries: Patent. Anterior communicating artery: Patent. Posterior communicating arteries: Not identified, likely hypoplastic or absent. Posterior cerebral arteries:  Patent. Basilar artery:  Patent. Vertebral arteries:  Patent. No evidence of high-grade stenosis, large vessel occlusion, or aneurysm unless noted above. IMPRESSION: 1. Patchy reduced diffusion in left caudate Carroll and body compatible with acute/early infarction. No hemorrhage or mass effect. 2. Normal MRA of the Carroll. No large vessel occlusion, aneurysm, or significant stenosis is identified. 3. Moderate chronic microvascular ischemic changes and moderate parenchymal volume loss of the Darryl. 4. Small chronic left occipital infarction. Electronically Signed   By: Mitzi HansenLance  Furusawa-Stratton M.D.   On: 03/11/2017 15:26   Ct Carroll Code Stroke Carroll Contrast  Result Date: 03/11/2017 CLINICAL DATA:  Code stroke.  Right-sided weakness.  Leftward gaze. EXAM: CT Carroll WITHOUT CONTRAST TECHNIQUE: Contiguous axial images were obtained from the base of the skull through the vertex without intravenous contrast. COMPARISON:  09/22/2014 FINDINGS: Darryl: There is a new infarct in the medial left occipital lobe which is well-defined with some enlargement of the adjacent extra-axial CSF spaces most compatible with an old infarct. Mild lateral and third ventriculomegaly is unchanged and may reflect central predominant cerebral atrophy. Patchy to confluent hypodensities in the subcortical and deep cerebral white matter have progressed and are nonspecific but compatible with moderately advanced chronic small  vessel ischemic disease. There is a new lacunar infarct in the anterior limb of the left internal capsule which is relatively well-defined. No definite acute cortically based infarct, intracranial hemorrhage, mass, midline shift, or extra-axial fluid collection is identified. Vascular: Calcified atherosclerosis at the  skullbase. No hyperdense vessel. Skull: No fracture or focal osseous lesion. Sinuses/Orbits: Small left maxillary sinus mucous retention cyst. Clear mastoid air cells. Bilateral cataract extraction. Other: None. ASPECTS [facility] Stroke Program Early CT Score) - Ganglionic level infarction (caudate, lentiform nuclei, internal capsule, insula, M1-M3 cortex): 7 - Supraganglionic infarction (M4-M6 cortex): 3 Total score (0-10 with 10 being normal): 10 IMPRESSION: 1. No acute intracranial hemorrhage or definite acute infarct. 2. New infarcts in the left occipital lobe and left internal capsule, both felt most likely to be chronic. 3. Moderately advanced chronic small vessel ischemic disease, progressed from 2016. 4. ASPECTS is 10 (considering the left internal capsule infarct as chronic). These results were communicated to Dr. Laurence Slate at 1:46 pmon 03/11/2017 by text page via the [facility] messaging system. Electronically Signed   By: Sebastian Ache M.D.   On: 03/11/2017 13:47    Microbiology: Recent Results (from the past 240 hour(s))  MRSA PCR Screening     Status: None   Collection Time: 03/11/17  8:15 PM  Result Value Ref Range Status   MRSA by PCR NEGATIVE NEGATIVE Final    Comment:        The GeneXpert MRSA Assay (FDA approved for NASAL specimens only), is one component of a comprehensive MRSA colonization surveillance program. It is not intended to diagnose MRSA infection nor to guide or monitor treatment for MRSA infections.      Labs: Basic Metabolic Panel: Recent Labs  Lab 03/11/17 1322 03/11/17 1330  NA 140 143  K 3.5 3.5  CL 107 107  CO2 24  --   GLUCOSE 145* 150*  BUN 17 19  CREATININE 1.03 1.00  CALCIUM 9.1  --    Liver Function Tests: Recent Labs  Lab 03/11/17 1322  AST 31  ALT 6*  ALKPHOS 70  BILITOT 0.8  PROT 6.0*  ALBUMIN 3.2*   No results for input(s): LIPASE, AMYLASE in the last 168 hours. No results for input(s): AMMONIA in the last 168 hours. CBC: Recent Labs   Lab 03/11/17 1322 03/11/17 1330  WBC 10.6*  --   NEUTROABS 7.2  --   HGB 14.2 14.3  HCT 42.8 42.0  MCV 88.4  --   PLT 186  --    Cardiac Enzymes: No results for input(s): CKTOTAL, CKMB, CKMBINDEX, TROPONINI in the last 168 hours. BNP: BNP (last 3 results) No results for input(s): BNP in the last 8760 hours.  ProBNP (last 3 results) No results for input(s): PROBNP in the last 8760 hours.  CBG: Recent Labs  Lab 03/11/17 2001 03/12/17 0641 03/12/17 1114 03/12/17 1614 03/13/17 0556  GLUCAP 154* 131* 123* 112* 147*       Signed:  Rhetta Mura MD   [facility] 03/13/2017, 10:37 AM

## 2017-03-14 DIAGNOSIS — F0281 Dementia in other diseases classified elsewhere with behavioral disturbance: Secondary | ICD-10-CM

## 2017-03-14 DIAGNOSIS — F02818 Dementia in other diseases classified elsewhere, unspecified severity, with other behavioral disturbance: Secondary | ICD-10-CM

## 2017-03-14 DIAGNOSIS — G20 Parkinson's disease: Secondary | ICD-10-CM

## 2017-03-31 NOTE — Progress Notes (Signed)
Darryl Carroll was seen today in the movement disorders clinic for neurologic consultation at the request of Stoneking, Ann Maki, MD.  The consultation is for the evaluation of gait changes and to r/o PD.  He is accompanied by his wife who supplements the history.   Pts wife states that he has fallen twice and "fx his spine."  Falls go back at least 6-7 years.  He has not fallen in the last few months, but sometimes he will fall several times in a week.  He lives at [facility] and does PT there 4 times per week.   CT of the brain performed last in February, 2013.  MRI of the brain performed last in June, 2012.  I tried to load the MRI of the brain, but the system did not find the examination, only the report.  It reported global atrophy, ventricular prominence that could be related to atrophy.  I also tried to load the CT of the brain, but again was not able to view the images and was only able to view the report, which demonstrated atrophy and chronic white matter disease along with basal ganglia calcifications.  07/02/24 update:  This patient is accompanied in the office by his spouse who supplements the history.  The patient was diagnosed with Parkinson's disease last visit and started on levodopa on 05/15/2014.  His wife called me on April 22 to state that the patient was having hallucinations, which seem to correlate with when he started medication.  They are both visual and auditory.   His wife states that he is more off balance.  Also, when he gets in the bed, he will sit down and lay across the bed instead of turning to lie vertical in the bed.  His wife states that he has had some of this in the past, but not this bad.  Having normal bowel movements and good bladder control.  His tremor is better with the medicine.  He was doing PT at [facility] but he quit going.    08/15/14 update:  This patient is accompanied in the office by his spouse who supplements the history.  Held carbidopa/levodopa 25/100  last visit due to hallucinations increasing and has done better in that regard.  He/wife are difficult to get a hold of on the phone.  Was going to try and change to the CR version over the phone but no response from them.  Therefore, he is currently off of PD meds.  Pts wife states that he isn't having hallucinations, but pt admits that he is still having some visual hallucinations but not as often.  He has had no falls.  He has a transport chair and uses that. Tremor seems a bit better. He has some lightheadedness.  No formal exercise.     12/21/14 update:  The patient returns today for follow-up, accompanied by his wife who supplements the history.  Records were reviewed since our last visit.  Last visit, I changed him to carbidopa/levodopa 25/100 CR 3 times per day as it seemed that the immediate release exacerbated hallucinations.  He had hallucinations even off of the medication, but the medication seemed to make it worse.  He does have baseline dementia.  Today, the patient and his wife state that he is now in a SNF and I reviewed SNF medications; he is not on carbidopa/levodopa at all.  His wife states that she doesn't know why - "you will need to ask Dr. [name].  He is  in charge."   He did present to the emergency room on 10/23/2014 with complaints of generalized weakness.  His white blood cell count was 15.4, but otherwise his workup was negative and he was sent home and not admitted.  He was taken to the SNF after this according to his wife.  Pt denies hallucinations but wife states that he is having those.  Pt does describe some hallucination.  States that a dog got run over in the parking lot yesterday and they brought the dead dog into his room but insists that this wasn't a hallucination but that it really happened.  04/12/15 update: The patient returns today, accompanied by his wife who supplements the history.  The patient is on no medication for Parkinson's disease.  It was discontinued by  another physician and his wife did not want me to restart it, stating that his primary care physician was in charge of his medications.  I did call PCP office to get notes but didn't see why the levodopa was stopped or where/when it was d/c.   However, she did want me to help with his hallucinations last visit and therefore we initiated Nuplazid.  His hallucinations have resolved on the medication.  He is frustrated that the SNF won't let him walk but wife states that she was unaware of this.  04/01/17 update: Patient is seen today.  I have not seen him in nearly 2 years.  At that point in time, his levodopa had been discontinued by another physician and his wife wanted his primary care physician to take over and did not want me to prescribe his medications.  I have not seen him since then.  His wife accompanies him today and supplements the history.  He is back on carbidopa/levodopa 25/100 tid.   I have reviewed a number of records since our last visit.  The patient was just recently hospitalized with left gaze preference and right facial droop.  He was seen by neurology.  MRI of the brain was completed and is reviewed by me.  There was patchy reduced diffusion in the left caudate.  There is a significant amount of atrophy as well.  MRA brain was negative.  Carotid ultrasound with 1-39% stenosis bilaterally.  Carotid ultrasound demonstrated left ventricular ejection fraction of 60-65%.  There was severe left atrial dilation.  He was not a TPA candidate due to the fact that he was on Xarelto.  It was felt that he may not have been compliant with Xarelto on an outpatient basis, although I am not sure why they thought that as he was in SNF before this.  He was started on Lipitor 40.  Aspirin was added to his Xarelto.  Patient was very confused while in the hospital and started on Haldol at night but discontinued by neurology because of the fact he has Parkinson's.  This was replaced with Seroquel, 12.5 mg twice per  day.  He used to be on nuplazid.  It is unknown why or when this was d/c.  He is now off of the seroquel as well now that he is back is SNF.  He is still having some hallucinations.    PREVIOUS MEDICATIONS: none to date  ALLERGIES:   Allergies  Allergen Reactions  . Contrast Media [Iodinated Diagnostic Agents] Other (See Comments)    Reaction when stints put in  . Zolpidem Other (See Comments)    unknown    CURRENT MEDICATIONS:  Outpatient Encounter Medications as of 04/01/2017  Medication Sig  . acetaminophen (TYLENOL) 325 MG tablet Take 650 mg by mouth every 6 (six) hours.  Marland Kitchen acyclovir (ZOVIRAX) 400 MG tablet Take 400 mg by mouth 2 (two) times daily.  Marland Kitchen aspirin 81 MG EC tablet Take 1 tablet (81 mg total) by mouth daily.  Marland Kitchen atenolol (TENORMIN) 100 MG tablet Take 1 tablet (100 mg total) by mouth daily. (Patient taking differently: Take 100 mg by mouth daily with breakfast. )  . atorvastatin (LIPITOR) 40 MG tablet Take 1 tablet (40 mg total) by mouth daily at 6 PM.  . Cholecalciferol (VITAMIN D-3) 1000 units CAPS Take 1,000 Units by mouth daily.  Marland Kitchen diltiazem (CARDIZEM CD) 120 MG 24 hr capsule Take 120 mg by mouth daily.  Marland Kitchen doxycycline (VIBRAMYCIN) 100 MG capsule Take 100 mg by mouth 2 (two) times daily.  Marland Kitchen lisinopril (PRINIVIL,ZESTRIL) 40 MG tablet Take 40 mg by mouth at bedtime.   . metFORMIN (GLUCOPHAGE) 500 MG tablet Take 500 mg by mouth daily with breakfast.  . Multiple Vitamins-Minerals (MULTIVITAMIN WITH MINERALS) tablet Take 1 tablet by mouth daily.  . Probiotic Product (RISA-BID PROBIOTIC PO) Take by mouth.  . Rivaroxaban (XARELTO) 15 MG TABS tablet Take 15 mg by mouth at bedtime.   . sertraline (ZOLOFT) 25 MG tablet Take 75 mg by mouth daily.   . carbidopa-levodopa (SINEMET IR) 25-100 MG tablet Take 1 tablet by mouth 3 (three) times daily.  . [DISCONTINUED] Carbidopa-Levodopa ER (SINEMET CR) 25-100 MG tablet controlled release Take 1 tablet by mouth 3 (three) times daily.  .  [DISCONTINUED] Maltodextrin-Xanthan Gum (RESOURCE THICKENUP CLEAR) POWD Take 1 g by mouth as needed.  . [DISCONTINUED] saccharomyces boulardii (FLORASTOR) 250 MG capsule Take 250 mg by mouth at bedtime.   No facility-administered encounter medications on file as of 04/01/2017.     PAST MEDICAL HISTORY:   Past Medical History:  Diagnosis Date  . CAD (coronary artery disease)    s/p multiple PCIs in all 3 vessels with DES. Last PCI 2005 with overlapping Cypher stents in CFX bifurcation lesion  . DM (diabetes mellitus), type 2 (HCC)   . Glaucoma   . Gout   . HTN (hypertension)    not under good control  . Hyperlipidemia   . Macular degeneration   . Obesity   . Osteoporosis   . Paroxysmal atrial fibrillation (HCC)   . Peptic ulcer disease   . Sleep apnea   . Spinal stenosis     PAST SURGICAL HISTORY:   Past Surgical History:  Procedure Laterality Date  . Bilateral carpal tunnel surgery    . CARDIAC CATHETERIZATION     x4-5, PCIs  . Thyroglossal duct cyst removed in Jan or Feb 2005    . TURP VAPORIZATION      SOCIAL HISTORY:   Social History   Socioeconomic History  . Marital status: Married    Spouse name: Not on file  . Number of children: Not on file  . Years of education: Not on file  . Highest education level: Not on file  Social Needs  . Financial resource strain: Not on file  . Food insecurity - worry: Not on file  . Food insecurity - inability: Not on file  . Transportation needs - medical: Not on file  . Transportation needs - non-medical: Not on file  Occupational History  . Not on file  Tobacco Use  . Smoking status: Former Smoker    Types: Cigarettes    Last attempt to quit: 10/03/1983  Years since quitting: 33.5  . Smokeless tobacco: Never Used  Substance and Sexual Activity  . Alcohol use: No    Alcohol/week: 0.0 oz  . Drug use: No  . Sexual activity: Not on file  Other Topics Concern  . Not on file  Social History Narrative  . Not on file      FAMILY HISTORY:   Family Status  Relation Name Status  . Father  Deceased       MI  . Mother  Deceased       diabetes  . Brother 7 siblings/3 living Deceased       MI    ROS:  A complete 10 system review of systems was obtained and was unremarkable apart from what is mentioned above.  PHYSICAL EXAMINATION:    VITALS:   Vitals:   04/01/17 1050  BP: (!) 178/88  Pulse: 70  SpO2: 91%    GEN:  The patient appears stated age and is in NAD. HEENT:  Normocephalic, atraumatic.  The mucous membranes are moist. The superficial temporal arteries are without ropiness or tenderness. CV:  irreg irreg Lungs:  CTAB Neck/HEME:  There are no carotid bruits bilaterally.  Neurological examination:  Orientation: He is oriented to place and person but not time. Cranial nerves: There is good facial symmetry.There is no significant ptosis today.   Extraocular muscles are intact. The visual fields are full to confrontational testing. The speech is fluent and clear.  He is hypophonic.  Soft palate rises symmetrically and there is no tongue deviation. Hearing is intact to conversational tone. Sensation: Sensation is intact to light touch throughout. Motor: Strength is 5/5 in the upper and lower extremities today.  Movement examination: Tone: There is no rigidity today.  He has some gegenhalten. Abnormal movements: There is a left upper extremity resting tremor, overall very mild Coordination:  There is mild trouble with finger taps and hand opening and closing, especially on the left. Gait and Station: The patient is in a wheelchair.  He gets up with a gait belt and with assistance from the examiner.  He is given a walker.  Once out in the hallway, he actually walks fairly well.  Lab Results  Component Value Date   TSH 0.737 05/15/2014   Lab Results  Component Value Date   VITAMINB12 607 05/15/2014   Lab Results  Component Value Date   WBC 10.6 (H) 03/11/2017   HGB 14.3 03/11/2017    HCT 42.0 03/11/2017   MCV 88.4 03/11/2017   PLT 186 03/11/2017     Chemistry      Component Value Date/Time   NA 143 03/11/2017 1330   K 3.5 03/11/2017 1330   CL 107 03/11/2017 1330   CO2 24 03/11/2017 1322   BUN 19 03/11/2017 1330   CREATININE 1.00 03/11/2017 1330   CREATININE 0.85 07/03/2014 1427      Component Value Date/Time   CALCIUM 9.1 03/11/2017 1322   ALKPHOS 70 03/11/2017 1322   AST 31 03/11/2017 1322   ALT 6 (L) 03/11/2017 1322   BILITOT 0.8 03/11/2017 1322     Lab Results  Component Value Date   CHOL 192 03/12/2017   HDL 32 (L) 03/12/2017   LDLCALC 121 (H) 03/12/2017   TRIG 193 (H) 03/12/2017   CHOLHDL 6.0 03/12/2017      ASSESSMENT/PLAN:  1.  Cerebral infarct  -We discussed the diagnosis as well as pathophysiology of the disease.  We discussed signs and sx's of stroke and  importance of calling 911 immediately should he have any of these. Pt now at SNF  -Talked about stroke risk factors and about those risk factors which are modifiable.  There were questions about whether pt taking xarelto appropriately before came to hospital with infarct.  I am not quite sure about why there were questions about this as the patient was in a nursing facility.  Aspirin was added because of this.  I talked to the patient and his wife about being on both aspirin and Xarelto.  Ultimately, we decided to take him off of the aspirin in 3 months.  We discussed extensively risks, benefits, and side effects of both of these medications together as well as individually.  We talked about letting somebody know if he has any melena or hematochezia.  -Talked about importance of blood pressure control with a goal <130/80 mm Hg.   -Talked about importance of lipid control and proper diet.  Lipids should be managed intensively, with a goal LDL < 70 mg/dL.  Started on lipitor at hospital    -I counseled the patient on measures to reduce stroke risk, including the importance of medication  compliance, risk factor control, exercise, healthy diet, and avoidance of smoking.    2.  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  He has no longer on this.  I have no idea why it was discontinued.  He was given Haldol in the hospital, which was ultimately changed to quetiapine by the neurologist because of recognition of his Parkinson's disease.  He is off of this now as well.  His wife reports he still has some hallucinations, but I will let this be treated by the nursing facility.  -been treated for his PD by PCP.  His wife had asked me several years ago to allow PCP to treat.  Back on carbidopa/levodopa 25/100  3.  Fluctuating Ptosis  -Patient's wife states that this has been going on ever since a surgery for a herpetic infection.  It is odd that this would be fluctuating ptosis if it is structural, but she states it has been going on for years.  His myasthenia labs were negative. 4.  He will follow-up here on an as-needed basis.  Greater than 50% of the 25-minute visit was spent in counseling with the patient.  This above time did not include the 40 minutes required for record review which is described in detail above.

## 2017-04-01 ENCOUNTER — Ambulatory Visit: Payer: Medicare Other | Admitting: Neurology

## 2017-04-01 ENCOUNTER — Encounter: Payer: Self-pay | Admitting: Neurology

## 2017-04-01 VITALS — BP 178/88 | HR 70

## 2017-04-01 DIAGNOSIS — F0280 Dementia in other diseases classified elsewhere without behavioral disturbance: Secondary | ICD-10-CM

## 2017-04-01 DIAGNOSIS — G20 Parkinson's disease: Secondary | ICD-10-CM

## 2017-04-01 DIAGNOSIS — I63412 Cerebral infarction due to embolism of left middle cerebral artery: Secondary | ICD-10-CM

## 2017-07-03 ENCOUNTER — Other Ambulatory Visit: Payer: Self-pay | Admitting: Neurosurgery

## 2017-07-03 DIAGNOSIS — M545 Low back pain, unspecified: Secondary | ICD-10-CM

## 2017-07-03 DIAGNOSIS — M546 Pain in thoracic spine: Secondary | ICD-10-CM

## 2017-07-12 ENCOUNTER — Encounter (HOSPITAL_COMMUNITY): Payer: Self-pay

## 2017-07-12 ENCOUNTER — Emergency Department (HOSPITAL_COMMUNITY): Payer: Medicare Other

## 2017-07-12 ENCOUNTER — Inpatient Hospital Stay (HOSPITAL_COMMUNITY)
Admission: EM | Admit: 2017-07-12 | Discharge: 2017-07-17 | DRG: 071 | Disposition: A | Discharge status: Skilled Nursing Facility | Payer: Medicare Other | Attending: Internal Medicine | Admitting: Internal Medicine

## 2017-07-12 DIAGNOSIS — I4891 Unspecified atrial fibrillation: Secondary | ICD-10-CM | POA: Diagnosis present

## 2017-07-12 DIAGNOSIS — I482 Chronic atrial fibrillation: Secondary | ICD-10-CM

## 2017-07-12 DIAGNOSIS — Z66 Do not resuscitate: Secondary | ICD-10-CM | POA: Diagnosis not present

## 2017-07-12 DIAGNOSIS — E1151 Type 2 diabetes mellitus with diabetic peripheral angiopathy without gangrene: Secondary | ICD-10-CM | POA: Diagnosis present

## 2017-07-12 DIAGNOSIS — J9621 Acute and chronic respiratory failure with hypoxia: Secondary | ICD-10-CM | POA: Diagnosis not present

## 2017-07-12 DIAGNOSIS — J189 Pneumonia, unspecified organism: Secondary | ICD-10-CM

## 2017-07-12 DIAGNOSIS — G9341 Metabolic encephalopathy: Secondary | ICD-10-CM | POA: Diagnosis not present

## 2017-07-12 DIAGNOSIS — J690 Pneumonitis due to inhalation of food and vomit: Secondary | ICD-10-CM

## 2017-07-12 DIAGNOSIS — N390 Urinary tract infection, site not specified: Secondary | ICD-10-CM | POA: Diagnosis present

## 2017-07-12 DIAGNOSIS — R1314 Dysphagia, pharyngoesophageal phase: Secondary | ICD-10-CM | POA: Diagnosis present

## 2017-07-12 DIAGNOSIS — K5900 Constipation, unspecified: Secondary | ICD-10-CM | POA: Diagnosis present

## 2017-07-12 DIAGNOSIS — I480 Paroxysmal atrial fibrillation: Secondary | ICD-10-CM | POA: Diagnosis present

## 2017-07-12 DIAGNOSIS — Z7401 Bed confinement status: Secondary | ICD-10-CM

## 2017-07-12 DIAGNOSIS — D696 Thrombocytopenia, unspecified: Secondary | ICD-10-CM | POA: Diagnosis present

## 2017-07-12 DIAGNOSIS — G4733 Obstructive sleep apnea (adult) (pediatric): Secondary | ICD-10-CM | POA: Diagnosis present

## 2017-07-12 DIAGNOSIS — I472 Ventricular tachycardia: Secondary | ICD-10-CM

## 2017-07-12 DIAGNOSIS — I2510 Atherosclerotic heart disease of native coronary artery without angina pectoris: Secondary | ICD-10-CM

## 2017-07-12 DIAGNOSIS — Z515 Encounter for palliative care: Secondary | ICD-10-CM | POA: Diagnosis present

## 2017-07-12 DIAGNOSIS — G8929 Other chronic pain: Secondary | ICD-10-CM | POA: Diagnosis present

## 2017-07-12 DIAGNOSIS — M109 Gout, unspecified: Secondary | ICD-10-CM | POA: Diagnosis present

## 2017-07-12 DIAGNOSIS — E876 Hypokalemia: Secondary | ICD-10-CM

## 2017-07-12 DIAGNOSIS — Z8711 Personal history of peptic ulcer disease: Secondary | ICD-10-CM

## 2017-07-12 DIAGNOSIS — F0280 Dementia in other diseases classified elsewhere without behavioral disturbance: Secondary | ICD-10-CM | POA: Diagnosis not present

## 2017-07-12 DIAGNOSIS — Z7984 Long term (current) use of oral hypoglycemic drugs: Secondary | ICD-10-CM

## 2017-07-12 DIAGNOSIS — R531 Weakness: Secondary | ICD-10-CM | POA: Diagnosis not present

## 2017-07-12 DIAGNOSIS — F0281 Dementia in other diseases classified elsewhere with behavioral disturbance: Secondary | ICD-10-CM | POA: Diagnosis present

## 2017-07-12 DIAGNOSIS — D72829 Elevated white blood cell count, unspecified: Secondary | ICD-10-CM | POA: Diagnosis not present

## 2017-07-12 DIAGNOSIS — G4730 Sleep apnea, unspecified: Secondary | ICD-10-CM | POA: Diagnosis not present

## 2017-07-12 DIAGNOSIS — M4854XA Collapsed vertebra, not elsewhere classified, thoracic region, initial encounter for fracture: Secondary | ICD-10-CM | POA: Diagnosis present

## 2017-07-12 DIAGNOSIS — Z7901 Long term (current) use of anticoagulants: Secondary | ICD-10-CM | POA: Diagnosis not present

## 2017-07-12 DIAGNOSIS — R627 Adult failure to thrive: Secondary | ICD-10-CM | POA: Diagnosis present

## 2017-07-12 DIAGNOSIS — R4182 Altered mental status, unspecified: Secondary | ICD-10-CM | POA: Diagnosis not present

## 2017-07-12 DIAGNOSIS — H3530 Unspecified macular degeneration: Secondary | ICD-10-CM | POA: Diagnosis present

## 2017-07-12 DIAGNOSIS — E785 Hyperlipidemia, unspecified: Secondary | ICD-10-CM | POA: Diagnosis not present

## 2017-07-12 DIAGNOSIS — M810 Age-related osteoporosis without current pathological fracture: Secondary | ICD-10-CM | POA: Diagnosis present

## 2017-07-12 DIAGNOSIS — M4316 Spondylolisthesis, lumbar region: Secondary | ICD-10-CM | POA: Diagnosis present

## 2017-07-12 DIAGNOSIS — Z79899 Other long term (current) drug therapy: Secondary | ICD-10-CM

## 2017-07-12 DIAGNOSIS — G20 Parkinson's disease: Secondary | ICD-10-CM | POA: Diagnosis not present

## 2017-07-12 DIAGNOSIS — M4800 Spinal stenosis, site unspecified: Secondary | ICD-10-CM | POA: Diagnosis present

## 2017-07-12 DIAGNOSIS — Z7982 Long term (current) use of aspirin: Secondary | ICD-10-CM

## 2017-07-12 DIAGNOSIS — I4729 Other ventricular tachycardia: Secondary | ICD-10-CM

## 2017-07-12 DIAGNOSIS — R3989 Other symptoms and signs involving the genitourinary system: Secondary | ICD-10-CM | POA: Diagnosis not present

## 2017-07-12 DIAGNOSIS — R0902 Hypoxemia: Secondary | ICD-10-CM | POA: Diagnosis present

## 2017-07-12 DIAGNOSIS — I10 Essential (primary) hypertension: Secondary | ICD-10-CM

## 2017-07-12 DIAGNOSIS — H409 Unspecified glaucoma: Secondary | ICD-10-CM | POA: Diagnosis present

## 2017-07-12 DIAGNOSIS — R3129 Other microscopic hematuria: Secondary | ICD-10-CM | POA: Diagnosis present

## 2017-07-12 DIAGNOSIS — Z87891 Personal history of nicotine dependence: Secondary | ICD-10-CM

## 2017-07-12 LAB — CBC WITH DIFFERENTIAL/PLATELET
BASOS ABS: 0 10*3/uL (ref 0.0–0.1)
Basophils Relative: 0 %
EOS ABS: 0 10*3/uL (ref 0.0–0.7)
Eosinophils Relative: 0 %
HEMATOCRIT: 45 % (ref 39.0–52.0)
Hemoglobin: 14.9 g/dL (ref 13.0–17.0)
LYMPHS ABS: 0.6 10*3/uL — AB (ref 0.7–4.0)
Lymphocytes Relative: 4 %
MCH: 30.9 pg (ref 26.0–34.0)
MCHC: 33.1 g/dL (ref 30.0–36.0)
MCV: 93.4 fL (ref 78.0–100.0)
MONOS PCT: 4 %
Monocytes Absolute: 0.6 10*3/uL (ref 0.1–1.0)
Neutro Abs: 14.9 10*3/uL — ABNORMAL HIGH (ref 1.7–7.7)
Neutrophils Relative %: 92 %
Platelets: 192 10*3/uL (ref 150–400)
RBC: 4.82 MIL/uL (ref 4.22–5.81)
RDW: 16.5 % — AB (ref 11.5–15.5)
WBC: 16.1 10*3/uL — AB (ref 4.0–10.5)

## 2017-07-12 LAB — COMPREHENSIVE METABOLIC PANEL
ALBUMIN: 2.7 g/dL — AB (ref 3.5–5.0)
ALT: 24 U/L (ref 17–63)
ANION GAP: 12 (ref 5–15)
AST: 29 U/L (ref 15–41)
Alkaline Phosphatase: 86 U/L (ref 38–126)
BUN: 24 mg/dL — AB (ref 6–20)
CALCIUM: 8.8 mg/dL — AB (ref 8.9–10.3)
CHLORIDE: 104 mmol/L (ref 101–111)
CO2: 23 mmol/L (ref 22–32)
Creatinine, Ser: 0.87 mg/dL (ref 0.61–1.24)
GFR calc Af Amer: >60 mL/min (ref >60)
GFR calc non Af Amer: >60 mL/min (ref >60)
Glucose, Bld: 178 mg/dL — ABNORMAL HIGH (ref 65–99)
POTASSIUM: 3.5 mmol/L (ref 3.5–5.1)
SODIUM: 139 mmol/L (ref 135–145)
Total Bilirubin: 1.3 mg/dL — ABNORMAL HIGH (ref 0.3–1.2)
Total Protein: 5.8 g/dL — ABNORMAL LOW (ref 6.5–8.1)

## 2017-07-12 LAB — TROPONIN I

## 2017-07-12 LAB — BRAIN NATRIURETIC PEPTIDE: B NATRIURETIC PEPTIDE 5: 553 pg/mL — AB (ref 0.0–100.0)

## 2017-07-12 MED ORDER — SODIUM CHLORIDE 0.9 % IV BOLUS
500.0000 mL | Freq: Once | INTRAVENOUS | Status: AC
  Administered 2017-07-12: 500 mL via INTRAVENOUS

## 2017-07-12 MED ORDER — ALBUTEROL SULFATE (2.5 MG/3ML) 0.083% IN NEBU: 2.5000 mg | INHALATION_SOLUTION | RESPIRATORY_TRACT | Status: DC | PRN

## 2017-07-12 MED ORDER — ACETAMINOPHEN 650 MG RE SUPP: 650.0000 mg | Freq: Four times a day (QID) | RECTAL | Status: DC | PRN

## 2017-07-12 MED ORDER — ACETAMINOPHEN 325 MG PO TABS
650.0000 mg | ORAL_TABLET | Freq: Four times a day (QID) | ORAL | Status: DC | PRN
  Administered 2017-07-13 (×2): 650 mg via ORAL
  Filled 2017-07-12 (×2): qty 2

## 2017-07-12 MED ORDER — ONDANSETRON HCL 4 MG/2ML IJ SOLN: 4.0000 mg | Freq: Four times a day (QID) | INTRAMUSCULAR | Status: DC | PRN

## 2017-07-12 MED ORDER — POTASSIUM CHLORIDE 10 MEQ/100ML IV SOLN
10.0000 meq | INTRAVENOUS | Status: AC
  Administered 2017-07-12 (×2): 10 meq via INTRAVENOUS
  Filled 2017-07-12 (×2): qty 100

## 2017-07-12 MED ORDER — PIPERACILLIN-TAZOBACTAM 3.375 G IVPB
3.3750 g | Freq: Three times a day (TID) | INTRAVENOUS | Status: DC
  Administered 2017-07-13 – 2017-07-17 (×14): 3.375 g via INTRAVENOUS
  Filled 2017-07-12 (×14): qty 50

## 2017-07-12 MED ORDER — MORPHINE SULFATE (PF) 2 MG/ML IV SOLN
1.0000 mg | INTRAVENOUS | Status: DC | PRN
  Administered 2017-07-12: 1 mg via INTRAVENOUS

## 2017-07-12 MED ORDER — SODIUM CHLORIDE 0.9 % IV SOLN
INTRAVENOUS | Status: AC
  Administered 2017-07-12: 23:00:00 via INTRAVENOUS

## 2017-07-12 MED ORDER — MORPHINE SULFATE (PF) 2 MG/ML IV SOLN
INTRAVENOUS | Status: AC
  Filled 2017-07-12: qty 1

## 2017-07-12 MED ORDER — HYDROCODONE-ACETAMINOPHEN 5-325 MG PO TABS: 1.0000 | ORAL_TABLET | ORAL | Status: DC | PRN

## 2017-07-12 MED ORDER — ONDANSETRON HCL 4 MG PO TABS: 4.0000 mg | ORAL_TABLET | Freq: Four times a day (QID) | ORAL | Status: DC | PRN

## 2017-07-12 MED ORDER — RIVAROXABAN 15 MG PO TABS
15.0000 mg | ORAL_TABLET | Freq: Every day | ORAL | Status: DC
  Administered 2017-07-13 – 2017-07-15 (×3): 15 mg via ORAL
  Filled 2017-07-12 (×5): qty 1

## 2017-07-12 MED ORDER — VANCOMYCIN HCL IN DEXTROSE 1-5 GM/200ML-% IV SOLN
1000.0000 mg | Freq: Once | INTRAVENOUS | Status: AC
  Administered 2017-07-12: 1000 mg via INTRAVENOUS
  Filled 2017-07-12: qty 200

## 2017-07-12 MED ORDER — CARBIDOPA-LEVODOPA 25-100 MG PO TABS
1.0000 | ORAL_TABLET | Freq: Three times a day (TID) | ORAL | Status: DC
  Administered 2017-07-13 – 2017-07-17 (×11): 1 via ORAL
  Filled 2017-07-12 (×13): qty 1

## 2017-07-12 MED ORDER — MORPHINE SULFATE (PF) 4 MG/ML IV SOLN
1.0000 mg | INTRAVENOUS | Status: DC | PRN
  Administered 2017-07-13: 2 mg via INTRAVENOUS
  Filled 2017-07-12: qty 1

## 2017-07-12 MED ORDER — PIPERACILLIN-TAZOBACTAM 3.375 G IVPB 30 MIN
3.3750 g | Freq: Once | INTRAVENOUS | Status: AC
  Administered 2017-07-12: 3.375 g via INTRAVENOUS
  Filled 2017-07-12: qty 50

## 2017-07-12 MED ORDER — SERTRALINE HCL 100 MG PO TABS
100.0000 mg | ORAL_TABLET | Freq: Every day | ORAL | Status: DC
  Administered 2017-07-13 – 2017-07-17 (×5): 100 mg via ORAL
  Filled 2017-07-12 (×5): qty 1

## 2017-07-12 NOTE — ED Triage Notes (Signed)
He was seen at [facility] by staff and his family to vomit this afternoon. He c/o back pain, which is chronic d/t known vertebral fractures. He arrives here minimally verbal and in no distress. Pink M[employer] Form is with him also.

## 2017-07-12 NOTE — ED Notes (Signed)
Pt has condom cath on but has not produced a urine specimen at this time. Will continue to check to see if pt has urinated.

## 2017-07-12 NOTE — ED Provider Notes (Signed)
[facility]-EMERGENCY DEPT Provider Note   CSN: [id number] Arrival date & time: 07/12/17  1516     History   Chief Complaint Chief Complaint  Patient presents with  . Emesis    HPI Darryl Carroll is a 84 y.o. male.  HPI Patient presents from nursing home with son at bedside.  States he has had increased confusion and responsiveness this afternoon.  Per son patient vomited after eating and became short of breath and cyanotic.  This morning son states that the patient was lucid and conversant.  Level 5 caveat Past Medical History:  Diagnosis Date  . CAD (coronary artery disease)    s/p multiple PCIs in all 3 vessels with DES. Last PCI 2005 with overlapping Cypher stents in CFX bifurcation lesion  . DM (diabetes mellitus), type 2 (HCC)   . Glaucoma   . Gout   . HTN (hypertension)    not under good control  . Hyperlipidemia   . Macular degeneration   . Obesity   . Osteoporosis   . Paroxysmal atrial fibrillation (HCC)   . Peptic ulcer disease   . Sleep apnea   . Spinal stenosis     Patient Active Problem List   Diagnosis Date Noted  . Hypokalemia 07/12/2017  . Aspiration pneumonia (HCC) 07/12/2017  . PD (Parkinson's disease) (HCC)   . Dementia due to Parkinson's disease without behavioral disturbance (HCC)   . Stroke (cerebrum) (HCC) 03/11/2017  . Prosthetic joint infection (HCC) 12/06/2011  . Long term (current) use of anticoagulants 06/06/2010  . PERIPHERAL VASCULAR INSUFFICIENCY,  LEGS, BILATERAL 08/15/2009  . DIABETES MELLITUS, TYPE II 08/14/2008  . Hyperlipidemia 08/14/2008  . GOUT 08/14/2008  . OBESITY 08/14/2008  . Macular degeneration (senile) of retina 08/14/2008  . GLAUCOMA 08/14/2008  . Hypertension 08/14/2008  . Coronary atherosclerosis 08/14/2008  . FIBRILLATION, ATRIAL 08/14/2008  . Peptic ulcer 08/14/2008  . SPINAL STENOSIS 08/14/2008  . OSTEOPOROSIS 08/14/2008  . Sleep apnea 08/14/2008    Past Surgical History:    Procedure Laterality Date  . Bilateral carpal tunnel surgery    . CARDIAC CATHETERIZATION     x4-5, PCIs  . Thyroglossal duct cyst removed in Jan or Feb 2005    . TURP VAPORIZATION          Home Medications    Prior to Admission medications   Medication Sig Start Date End Date Taking? Authorizing Provider  acetaminophen (TYLENOL) 325 MG tablet Take 650 mg by mouth every 6 (six) hours.   Yes [provider]  acyclovir (ZOVIRAX) 400 MG tablet Take 400 mg by mouth 2 (two) times daily.   Yes [provider]  atenolol (TENORMIN) 100 MG tablet Take 1 tablet (100 mg total) by mouth daily. 08/30/13  Yes Kathleene Hazel, MD  atorvastatin (LIPITOR) 40 MG tablet Take 1 tablet (40 mg total) by mouth daily at 6 PM. 03/13/17  Yes Rhetta Mura, MD  carbidopa-levodopa (SINEMET IR) 25-100 MG tablet Take 1 tablet by mouth 3 (three) times daily. 03/14/17  Yes [provider]  Cholecalciferol (VITAMIN D-3) 1000 units CAPS Take 1,000 Units by mouth daily.   Yes [provider]  diltiazem (CARDIZEM CD) 120 MG 24 hr capsule Take 120 mg by mouth daily.   Yes [provider]  doxycycline (VIBRAMYCIN) 100 MG capsule Take 100 mg by mouth 2 (two) times daily.   Yes [provider]  Melatonin 3 MG TABS Take 3 mg by mouth at bedtime.  Yes [provider]  metFORMIN (GLUCOPHAGE) 500 MG tablet Take 500 mg by mouth daily with breakfast.   Yes [provider]  Multiple Vitamins-Minerals (MULTIVITAMIN WITH MINERALS) tablet Take 1 tablet by mouth daily.   Yes [provider]  Probiotic Product (RISA-BID PROBIOTIC PO) Take by mouth.   Yes [provider]  Rivaroxaban (XARELTO) 15 MG TABS tablet Take 15 mg by mouth at bedtime.    Yes [provider]  sertraline (ZOLOFT) 100 MG tablet Take 100 mg by mouth daily.    Yes [provider]  traMADol (ULTRAM) 50 MG tablet Take 50 mg by mouth every 8 (eight)  hours as needed for moderate pain.   Yes [provider]  aspirin 81 MG EC tablet Take 1 tablet (81 mg total) by mouth daily. Patient not taking: Reported on 07/12/2017 03/14/17   Rhetta Mura, MD    Family History Family History  Problem Relation Age of Onset  . Heart attack Father        deceased 26  . Diabetes Mother        deceased 20  . Heart attack Brother        deceased 64    Social History Social History   Tobacco Use  . Smoking status: Former Smoker    Types: Cigarettes    Last attempt to quit: 10/03/1983    Years since quitting: 33.7  . Smokeless tobacco: Never Used  Substance Use Topics  . Alcohol use: No    Alcohol/week: 0.0 oz  . Drug use: No     Allergies   Contrast media [iodinated diagnostic agents]; Other; and Zolpidem   Review of Systems Review of Systems  Unable to perform ROS: Mental status change     Physical Exam Updated Vital Signs BP 100/72   Pulse 84   Temp 98.8 F (37.1 C) (Oral)   Resp (!) 28   SpO2 94%   Physical Exam  Constitutional: He appears well-developed and well-nourished.  Frail-appearing  HENT:  Head: Normocephalic and atraumatic.  Mouth/Throat: Oropharynx is clear and moist.  Dry mucous membranes.  No obvious head trauma.  No intraoral trauma.  Eyes: EOM are normal.  Pinpoint pupils bilaterally.  Neck: Normal range of motion. Neck supple.  No meningismus.  No posterior midline cervical tenderness to palpation.  Cardiovascular:  Tachycardia.  Irregularly irregular.  Pulmonary/Chest: Effort normal. He has rales.  Rales especially in the left lung field.  No obvious respiratory distress.  Abdominal: Soft. Bowel sounds are normal. There is no tenderness. There is no rebound and no guarding.  Musculoskeletal: Normal range of motion. He exhibits no edema or tenderness.  Neurological: He is alert.  Oriented to person.  Follows simple commands.  Moving all extremities but appears to have some difficulty  moving his left upper extremity.  Neurologic exam is limited.  Skin: Skin is warm and dry. No rash noted. No erythema.  Nursing note and vitals reviewed.    ED Treatments / Results  Labs (all labs ordered are listed, but only abnormal results are displayed) Labs Reviewed  CBC WITH DIFFERENTIAL/PLATELET - Abnormal; Notable for the following components:      Result Value   WBC 16.1 (*)    RDW 16.5 (*)    Neutro Abs 14.9 (*)    Lymphs Abs 0.6 (*)    All other components within normal limits  COMPREHENSIVE METABOLIC PANEL - Abnormal; Notable for the following components:   Glucose, Bld 178 (*)  BUN 24 (*)    Calcium 8.8 (*)    Total Protein 5.8 (*)    Albumin 2.7 (*)    Total Bilirubin 1.3 (*)    All other components within normal limits  TROPONIN I  BRAIN NATRIURETIC PEPTIDE  URINALYSIS, ROUTINE W REFLEX MICROSCOPIC  TROPONIN I  TROPONIN I  TROPONIN I    EKG EKG Interpretation  Date/Time:  Sunday Jul 12 2017 17:05:04 EDT Ventricular Rate:  96 PR Interval:    QRS Duration: 138 QT Interval:  399 QTC Calculation: 505 R Axis:   74 Text Interpretation:  Atrial fibrillation Right bundle branch block Confirmed by Loren Racer ([id number]) on 07/12/2017 8:15:16 PM   Radiology Ct Head Wo Contrast  Result Date: 07/12/2017 CLINICAL DATA:  Altered mental status.  Minimally verbal. EXAM: CT HEAD WITHOUT CONTRAST TECHNIQUE: Contiguous axial images were obtained from the base of the skull through the vertex without intravenous contrast. COMPARISON:  03/11/2017 FINDINGS: Brain: No evidence of acute infarction, hemorrhage, hydrocephalus, extra-axial collection or mass lesion/mass effect. Stable findings of chronic ischemic infarction of the left occipital lobe, and left basal ganglia. Moderate brain parenchymal volume loss and advanced deep white matter microangiopathy. Vascular: Calcific atherosclerotic disease at the skull base. Skull: Normal. Negative for fracture or focal lesion.  Sinuses/Orbits: Polypoid mucosal thickening of the left maxillary sinus. Other: None. IMPRESSION: No acute intracranial abnormality. Stable findings of chronic ischemic infarctions of the left occipital lobe and left basal ganglia. Advanced brain parenchymal atrophy and chronic deep white matter microvascular disease. Electronically Signed   By: Ted Mcalpine M.D.   On: 07/12/2017 17:26   Dg Chest Port 1 View  Result Date: 07/12/2017 CLINICAL DATA:  Shortness of breath, vomiting. EXAM: PORTABLE CHEST 1 VIEW COMPARISON:  03/11/2017. FINDINGS: Trachea is midline. Heart size is grossly stable. Thoracic aorta is calcified. Lungs are somewhat low in volume with perihilar airspace opacification, left greater than right. No definite pleural fluid. Left shoulder arthroplasty. IMPRESSION: 1. Perihilar airspace opacification may be due to edema or pneumonia. Aspiration is not excluded. 2.  Aortic atherosclerosis (ICD10-170.0). Electronically Signed   By: Leanna Battles M.D.   On: 07/12/2017 17:16    Procedures Procedures (including critical care time)  Medications Ordered in ED Medications  sodium chloride 0.9 % bolus 500 mL (500 mLs Intravenous New Bag/Given 07/12/17 1953)  piperacillin-tazobactam (ZOSYN) IVPB 3.375 g (0 g Intravenous Stopped 07/12/17 1852)  vancomycin (VANCOCIN) IVPB 1000 mg/200 mL premix (0 mg Intravenous Stopped 07/12/17 1926)     Initial Impression / Assessment and Plan / ED Course  I have reviewed the triage vital signs and the nursing notes.  Pertinent labs & imaging results that were available during my care of the patient were reviewed by me and considered in my medical decision making (see chart for details).     Bilateral infiltrates on chest x-ray.  Elevation in white blood cell count.  Suspect patient has aspiration pneumonia.  Given broad-spectrum antibiotics.  Mild hypoxia and placed on supplemental oxygen.  No respiratory distress.  Patient did have a short run of  wide-complex tachycardia which spontaneously resolved.  Discussed with hospitalist who will see patient in the emergency department.  Final Clinical Impressions(s) / ED Diagnoses   Final diagnoses:  Aspiration pneumonia, unspecified aspiration pneumonia type, unspecified laterality, unspecified part of lung (HCC)  Atrial fibrillation, unspecified type (HCC)  Altered mental status, unspecified altered mental status type    ED Discharge Orders    None  Loren Racer, MD 07/12/17 2018

## 2017-07-12 NOTE — H&P (Addendum)
Darryl Carroll ZOX:[id number] DOB: 12-Aug-1927 DOA: 07/12/2017     PCP: Merlene Laughter, MD   Outpatient Specialists:  CARDS:  Dr. Clifton James   Patient arrived to ER on 07/12/17 at 1516  Patient coming from:  From facility [facility]  Chief Complaint:  Chief Complaint  Patient presents with  . Emesis    HPI: Darryl Carroll is a 84 y.o. male with medical history significant of CAD, DM 2, gout, HTN, HLD, paroxysmal atrial fibrillation, OSA, chronic back pain, peptic ulcer disease, parkinson disease    Presented with nausea and vomiting more confused than his baseline.  He has been dealing with significant back pain secondary to compression fractures.  Per son he  having a good conversation, patient was very lucid this morning he was eating and  then he developed episode of cyanosis of his lips and hands shortly thereafter he has vomited thereafter became short of breath.  And has been more sedated and confused ever since. Patient does have most form per son patient and his wife do not wish over aggressive interventions they agreeable to IV fluids antibiotics as needed and monitoring hospital admissions if needed but would like to avoid procedures and interventions.  Would be interested in palliative care consult.  Regarding pertinent Chronic problems: Last admission was in January 2019 with facial droop and dysarthria thought to be likely CVA MRI showed patchy reduced diffusion left caudate head and body he was started on aspirin in addition to Xarelto  History of atrial fibrillation chads vas score above 3 on Xarelto, on Cardizem And has risk for dysphasia and was on dysphagia free diet in January while hospitalized Known history of coronary artery disease with multivessel PCI last time in 2006, History of diabetes mellitus type 2 on metformin   While in ER:  Patient have had a round of ventricular tachycardia His blood pressure has been soft. Following Medications were ordered in  ER: Medications  piperacillin-tazobactam (ZOSYN) IVPB 3.375 g (0 g Intravenous Stopped 07/12/17 1852)  vancomycin (VANCOCIN) IVPB 1000 mg/200 mL premix (0 mg Intravenous Stopped 07/12/17 1926)  sodium chloride 0.9 % bolus 500 mL (500 mLs Intravenous New Bag/Given 07/12/17 1953)    Significant initial  Findings: Abnormal Labs Reviewed  CBC WITH DIFFERENTIAL/PLATELET - Abnormal; Notable for the following components:      Result Value   WBC 16.1 (*)    RDW 16.5 (*)    Neutro Abs 14.9 (*)    Lymphs Abs 0.6 (*)    All other components within normal limits  COMPREHENSIVE METABOLIC PANEL - Abnormal; Notable for the following components:   Glucose, Bld 178 (*)    BUN 24 (*)    Calcium 8.8 (*)    Total Protein 5.8 (*)    Albumin 2.7 (*)    Total Bilirubin 1.3 (*)    All other components within normal limits    Na 139 K 3.5  Cr stable,   Lab Results  Component Value Date   CREATININE 0.87 07/12/2017   CREATININE 1.00 03/11/2017   CREATININE 1.03 03/11/2017     WBC 16.1  HG/HCT   Stable,     Component Value Date/Time   HGB 14.9 07/12/2017 1725   HCT 45.0 07/12/2017 1725    Troponin (Point of Care Test) No results for input(s): TROPIPOC in the last 72 hours.  Lactic Acid, Venous    Component Value Date/Time   LATICACIDVEN 1.67 10/23/2014 1219    UA ordered  CT HEAD   NON acute  CXR - perihilar infiltrate possible aspiration PNA   ECG:  Personally reviewed by me showing: HR : 96 Rhythm: A.fib RBBB no evidence of ischemic changes QTC 503     ED Triage Vitals [07/12/17 1610]  Enc Vitals Group     BP 122/81     Pulse Rate 88     Resp (!) 30     Temp 98.8 F (37.1 C)     Temp Source Oral     SpO2 94 %     Weight      Height      Head Circumference      Peak Flow      Pain Score      Pain Loc      Pain Edu?      Excl. in GC?   ZOXW(96)@       Latest  Blood pressure 100/72, pulse 84, temperature 98.8 F (37.1 C), temperature source Oral, resp. rate (!)  28, SpO2 94 %.    Hospitalist was called for admission for Aspiration pneumonia   Review of Systems:    Pertinent positives include: cough, shortness of breath at rest.  Constitutional:  No weight loss, night sweats, Fevers, chills, fatigue, weight loss  HEENT:  No headaches, Difficulty swallowing,Tooth/dental problems,Sore throat,  No sneezing, itching, ear ache, nasal congestion, post nasal drip,  Cardio-vascular:  No chest pain, Orthopnea, PND, anasarca, dizziness, palpitations.no Bilateral lower extremity swelling  GI:  No heartburn, indigestion, abdominal pain, nausea, vomiting, diarrhea, change in bowel habits, loss of appetite, melena, blood in stool, hematemesis Resp:  no  No dyspnea on exertion, No excess mucus, no productive cough, No non-productive cough, No coughing up of blood.No change in color of mucus.No wheezing. Skin:  no rash or lesions. No jaundice GU:  no dysuria, change in color of urine, no urgency or frequency. No straining to urinate.  No flank pain.  Musculoskeletal:  No joint pain or no joint swelling. No decreased range of motion. No back pain.  Psych:  No change in mood or affect. No depression or anxiety. No memory loss.  Neuro: no localizing neurological complaints, no tingling, no weakness, no double vision, no gait abnormality, no slurred speech, no confusion  As per HPI otherwise 10 point review of systems negative.   Past Medical History:   Past Medical History:  Diagnosis Date  . CAD (coronary artery disease)    s/p multiple PCIs in all 3 vessels with DES. Last PCI 2005 with overlapping Cypher stents in CFX bifurcation lesion  . DM (diabetes mellitus), type 2 (HCC)   . Glaucoma   . Gout   . HTN (hypertension)    not under good control  . Hyperlipidemia   . Macular degeneration   . Obesity   . Osteoporosis   . Paroxysmal atrial fibrillation (HCC)   . Peptic ulcer disease   . Sleep apnea   . Spinal stenosis       Past Surgical  History:  Procedure Laterality Date  . Bilateral carpal tunnel surgery    . CARDIAC CATHETERIZATION     x4-5, PCIs  . Thyroglossal duct cyst removed in Jan or Feb 2005    . TURP VAPORIZATION      Social History:  Ambulatory   bed bound     reports that he quit smoking about 33 years ago. His smoking use included cigarettes. He has never used smokeless tobacco. He reports that he does  not drink alcohol or use drugs.     Family History:  Family History  Problem Relation Age of Onset  . Heart attack Father        deceased 22  . Diabetes Mother        deceased 93  . Heart attack Brother        deceased 82    Allergies: Allergies  Allergen Reactions  . Contrast Media [Iodinated Diagnostic Agents] Other (See Comments)    Reaction when stints put in  . Other     Bee stings  . Zolpidem Other (See Comments)    unknown     Prior to Admission medications   Medication Sig Start Date End Date Taking? Authorizing Provider  acetaminophen (TYLENOL) 325 MG tablet Take 650 mg by mouth every 6 (six) hours.   Yes [provider]  acyclovir (ZOVIRAX) 400 MG tablet Take 400 mg by mouth 2 (two) times daily.   Yes [provider]  atenolol (TENORMIN) 100 MG tablet Take 1 tablet (100 mg total) by mouth daily. 08/30/13  Yes Kathleene Hazel, MD  atorvastatin (LIPITOR) 40 MG tablet Take 1 tablet (40 mg total) by mouth daily at 6 PM. 03/13/17  Yes Rhetta Mura, MD  carbidopa-levodopa (SINEMET IR) 25-100 MG tablet Take 1 tablet by mouth 3 (three) times daily. 03/14/17  Yes [provider]  Cholecalciferol (VITAMIN D-3) 1000 units CAPS Take 1,000 Units by mouth daily.   Yes [provider]  diltiazem (CARDIZEM CD) 120 MG 24 hr capsule Take 120 mg by mouth daily.   Yes [provider]  doxycycline (VIBRAMYCIN) 100 MG capsule Take 100 mg by mouth 2 (two) times daily.   Yes [provider]  Melatonin 3 MG TABS Take 3 mg by mouth at  bedtime.   Yes [provider]  metFORMIN (GLUCOPHAGE) 500 MG tablet Take 500 mg by mouth daily with breakfast.   Yes [provider]  Multiple Vitamins-Minerals (MULTIVITAMIN WITH MINERALS) tablet Take 1 tablet by mouth daily.   Yes [provider]  Probiotic Product (RISA-BID PROBIOTIC PO) Take by mouth.   Yes [provider]  Rivaroxaban (XARELTO) 15 MG TABS tablet Take 15 mg by mouth at bedtime.    Yes [provider]  sertraline (ZOLOFT) 100 MG tablet Take 100 mg by mouth daily.    Yes [provider]  traMADol (ULTRAM) 50 MG tablet Take 50 mg by mouth every 8 (eight) hours as needed for moderate pain.   Yes [provider]  aspirin 81 MG EC tablet Take 1 tablet (81 mg total) by mouth daily. Patient not taking: Reported on 07/12/2017 03/14/17   Rhetta Mura, MD   Physical Exam: Blood pressure 100/72, pulse 84, temperature 98.8 F (37.1 C), temperature source Oral, resp. rate (!) 28, SpO2 94 %. 1. General:  in No Acute distress  Chronically ill  -appearing 2. Psychological: somnolent  Not  Oriented 3. Head/ENT:    Dry Mucous Membranes                          Head Non traumatic, neck supple                            Poor Dentition 4. SKIN:   decreased Skin turgor,  Skin clean Dry and intact no rash 5. Heart: Regular rate and rhythm no  Murmur, no Rub or  gallop 6. Lungs:  no wheezes or crackles   7. Abdomen: Soft non-tender, Non distended bowel sounds present 8. Lower extremities: no clubbing, cyanosis, or edema 9. Neurologically Grossly intact, moving all 4 extremities equally   10. MSK: Normal range of motion   LABS:     Recent Labs  Lab 07/12/17 1725  WBC 16.1*  NEUTROABS 14.9*  HGB 14.9  HCT 45.0  MCV 93.4  PLT 192   Basic Metabolic Panel: Recent Labs  Lab 07/12/17 1725  NA 139  K 3.5  CL 104  CO2 23  GLUCOSE 178*  BUN 24*  CREATININE 0.87  CALCIUM 8.8*      Recent Labs  Lab  07/12/17 1725  AST 29  ALT 24  ALKPHOS 86  BILITOT 1.3*  PROT 5.8*  ALBUMIN 2.7*   No results for input(s): LIPASE, AMYLASE in the last 168 hours. No results for input(s): AMMONIA in the last 168 hours.    HbA1C: No results for input(s): HGBA1C in the last 72 hours. CBG: No results for input(s): GLUCAP in the last 168 hours.    Urine analysis:    Component Value Date/Time   COLORURINE YELLOW 03/12/2017 0111   APPEARANCEUR CLEAR 03/12/2017 0111   LABSPEC 1.012 03/12/2017 0111   PHURINE 6.0 03/12/2017 0111   GLUCOSEU NEGATIVE 03/12/2017 0111   HGBUR NEGATIVE 03/12/2017 0111   BILIRUBINUR NEGATIVE 03/12/2017 0111   KETONESUR NEGATIVE 03/12/2017 0111   PROTEINUR NEGATIVE 03/12/2017 0111   UROBILINOGEN 0.2 10/23/2014 1120   NITRITE NEGATIVE 03/12/2017 0111   LEUKOCYTESUR NEGATIVE 03/12/2017 0111      Cultures:    Component Value Date/Time   SDES SPUTUM 08/16/2010 2002   SPECREQUEST NONE 08/16/2010 2002   CULT NORMAL OROPHARYNGEAL FLORA 08/16/2010 2002   REPTSTATUS 08/19/2010 FINAL 08/16/2010 2002     Radiological Exams on Admission: Ct Head Wo Contrast  Result Date: 07/12/2017 CLINICAL DATA:  Altered mental status.  Minimally verbal. EXAM: CT HEAD WITHOUT CONTRAST TECHNIQUE: Contiguous axial images were obtained from the base of the skull through the vertex without intravenous contrast. COMPARISON:  03/11/2017 FINDINGS: Brain: No evidence of acute infarction, hemorrhage, hydrocephalus, extra-axial collection or mass lesion/mass effect. Stable findings of chronic ischemic infarction of the left occipital lobe, and left basal ganglia. Moderate brain parenchymal volume loss and advanced deep white matter microangiopathy. Vascular: Calcific atherosclerotic disease at the skull base. Skull: Normal. Negative for fracture or focal lesion. Sinuses/Orbits: Polypoid mucosal thickening of the left maxillary sinus. Other: None. IMPRESSION: No acute intracranial abnormality. Stable  findings of chronic ischemic infarctions of the left occipital lobe and left basal ganglia. Advanced brain parenchymal atrophy and chronic deep white matter microvascular disease. Electronically Signed   By: Ted Mcalpine M.D.   On: 07/12/2017 17:26   Dg Chest Port 1 View  Result Date: 07/12/2017 CLINICAL DATA:  Shortness of breath, vomiting. EXAM: PORTABLE CHEST 1 VIEW COMPARISON:  03/11/2017. FINDINGS: Trachea is midline. Heart size is grossly stable. Thoracic aorta is calcified. Lungs are somewhat low in volume with perihilar airspace opacification, left greater than right. No definite pleural fluid. Left shoulder arthroplasty. IMPRESSION: 1. Perihilar airspace opacification may be due to edema or pneumonia. Aspiration is not excluded. 2.  Aortic atherosclerosis (ICD10-170.0). Electronically Signed   By: Leanna Battles M.D.   On: 07/12/2017 17:16    Chart has been reviewed    Assessment/Plan  84 y.o. male with medical history significant of CAD, DM 2, gout, HTN, HLD, paroxysmal atrial fibrillation, OSA,  chronic back pain, peptic ulcer disease, parkinson disease Admitted for aspiration pneumonia  Present on Admission: . Aspiration pneumonia (HCC) treated with Zosyn given recent hospitalization of the patient, from nursing home we will add vancomycin check MRSA discussed with family possibility of aspiration at this point son states he would like for his father to be able to eat whatever he is comfortable with the past he had dysphagia free diet but have not family has been giving him anything that he would like stating that at this stage of his life they would like to keep him happy and feeding him what he enjoys with understanding that aspiration is a possibility  . Dementia due to Parkinson's disease without behavioral disturbance (HCC) -mild will monitor for any degree of sundowning . FIBRILLATION, ATRIAL -  continue Xarelto soft blood pressures  Hold atenolol or diltiazem .  Hyperlipidemia - stable . Hypertension currently soft blood pressures we will hold off on home medications . PD (Parkinson's disease) (HCC) continue Sinemet overall progression of disease family at this point interested in less aggressive interventions and palliative care discussion . Sleep apnea  - no  longer uses CPAP for many years . Hypokalemia we will replace check magnesium level especially given nonsustained V. tach Nonsustained V. tach noted in the emergency department monitor on telemetry patient is DNR/DNI and asymptomatic.  Discussed with family possibility of cardiac event.  At this point they would like to avoid aggressive interventions . Coronary atherosclerosis -chronic patient currently denies any chest pain given an episode of cyanosis and vomiting in this elderly gentleman will cycle cardiac enzymes  History of diabetes mellitus patient no longer taking any medications family states that his diabetes has been diet controlled Other plan as per orders.  DVT prophylaxis:   Xarelto  Code Status:  DNR/DNI   as per family  I had personally discussed CODE STATUS with patient and family    Family Communication:   Family   at  Bedside  plan of care was discussed with   Son,   Disposition Plan:                             Back to current facility when stable                                                  Would benefit from PT/OT eval prior to DC                         Social Work  Palliative care   consulted                          Consults called: none  Admission status:    inpatient        Level of care     tele         Therisa Doyne 07/12/2017, 9:13 PM    [facility]  Pager [phone]   after 2 AM please page floor coverage PA If 7AM-7PM, please contact the day team taking care of the patient  [email or web address]  Password [email or web address]

## 2017-07-12 NOTE — Progress Notes (Signed)
Pharmacy Antibiotic Note  Darryl Carroll is a 84 y.o. male admitted on 07/12/2017 with aspiration PNA.  Pharmacy has been consulted for zosyn and vancomcyin dosing.  Plan: Zosyn 3.375g IV q8h (4 hour infusion). Vancomycin 1 Gm x1 then 750 mg IV q24h for est AUC = 528 Goal AUC = 400-500 Daily SCr F/u cultures/levels as needed    Temp (24hrs), Avg:98.8 F (37.1 C), Min:98.8 F (37.1 C), Max:98.8 F (37.1 C)  Recent Labs  Lab 07/12/17 1725  WBC 16.1*  CREATININE 0.87    CrCl cannot be calculated (Unknown ideal weight.).    Allergies  Allergen Reactions  . Contrast Media [Iodinated Diagnostic Agents] Other (See Comments)    Reaction when stints put in  . Other     Bee stings  . Zolpidem Other (See Comments)    unknown    Antimicrobials this admission: 5/5 zosyn >>  5/5 vancomycin >>   Dose adjustments this admission:   Microbiology results:  BCx:   UCx:    Sputum:    MRSA PCR:   Thank you for allowing pharmacy to be a part of this patient's care.  Lorenza Evangelist 07/12/2017 9:08 PM

## 2017-07-12 NOTE — ED Notes (Signed)
ED TO INPATIENT HANDOFF REPORT  Name/Age/Gender Darryl Carroll 84 y.o. male  Code Status Code Status History    Date Active Date Inactive Code Status Order ID Comments User Context   03/11/2017 6712 03/13/2017 2212 Full Code [id number]  [facility]      Home/SNF/Other Nursing Home  Chief Complaint Back Pain; Nausea; Vomiting  Level of Care/Admitting Diagnosis ED Disposition    ED Disposition Condition [place]: [place] [[id number]]  Level of Care: Telemetry [5]  Admit to tele based on following criteria: Monitor for Ischemic changes  Diagnosis: Aspiration pneumonia [facility]) [[id number]]  Admitting Physician: Toy Baker [[id number]]  Attending Physician: Toy Baker [[id number]]  Estimated length of stay: 3 - 4 days  Certification:: I certify this patient will need inpatient services for at least 2 midnights  PT Class (Do Not Modify): Inpatient [101]  PT Acc Code (Do Not Modify): Private [1]       Medical History Past Medical History:  Diagnosis Date  . CAD (coronary artery disease)    s/p multiple PCIs in all 3 vessels with DES. Last PCI 2005 with overlapping Cypher stents in CFX bifurcation lesion  . DM (diabetes mellitus), type 2 ([facility])   . Glaucoma   . Gout   . HTN (hypertension)    not under good control  . Hyperlipidemia   . Macular degeneration   . Obesity   . Osteoporosis   . Paroxysmal atrial fibrillation (HCC)   . Peptic ulcer disease   . Sleep apnea   . Spinal stenosis     Allergies Allergies  Allergen Reactions  . Contrast Media [Iodinated Diagnostic Agents] Other (See Comments)    Reaction when stints put in  . Other     Bee stings  . Zolpidem Other (See Comments)    unknown    IV Location/Drains/Wounds Patient Lines/Drains/Airways Status   Active Line/Drains/Airways    Name:   Placement date:   Placement time:   Site:   Days:   Peripheral IV 07/12/17 Right;Lateral Forearm   07/12/17     1821    Forearm   less than 1          Labs/Imaging Results for orders placed or performed during the hospital encounter of 07/12/17 (from the past 48 hour(s))  CBC with Differential/Platelet     Status: Abnormal   Collection Time: 07/12/17  5:25 PM  Result Value Ref Range   WBC 16.1 (H) 4.0 - 10.5 K/uL   RBC 4.82 4.22 - 5.81 MIL/uL   Hemoglobin 14.9 13.0 - 17.0 g/dL   HCT 45.0 39.0 - 52.0 %   MCV 93.4 78.0 - 100.0 fL   MCH 30.9 26.0 - 34.0 pg   MCHC 33.1 30.0 - 36.0 g/dL   RDW 16.5 (H) 11.5 - 15.5 %   Platelets 192 150 - 400 K/uL   Neutrophils Relative % 92 %   Lymphocytes Relative 4 %   Monocytes Relative 4 %   Eosinophils Relative 0 %   Basophils Relative 0 %   Neutro Abs 14.9 (H) 1.7 - 7.7 K/uL   Lymphs Abs 0.6 (L) 0.7 - 4.0 K/uL   Monocytes Absolute 0.6 0.1 - 1.0 K/uL   Eosinophils Absolute 0.0 0.0 - 0.7 K/uL   Basophils Absolute 0.0 0.0 - 0.1 K/uL   Smear Review MORPHOLOGY UNREMARKABLE     Comment: Performed at [facility], [address] [place], [place]  [address]  Comprehensive metabolic panel     Status: Abnormal   Collection Time: 07/12/17  5:25 PM  Result Value Ref Range   Sodium 139 135 - 145 mmol/L   Potassium 3.5 3.5 - 5.1 mmol/L   Chloride 104 101 - 111 mmol/L   CO2 23 22 - 32 mmol/L   Glucose, Bld 178 (H) 65 - 99 mg/dL   BUN 24 (H) 6 - 20 mg/dL   Creatinine, Ser 0.87 0.61 - 1.24 mg/dL   Calcium 8.8 (L) 8.9 - 10.3 mg/dL   Total Protein 5.8 (L) 6.5 - 8.1 g/dL   Albumin 2.7 (L) 3.5 - 5.0 g/dL   AST 29 15 - 41 U/L   ALT 24 17 - 63 U/L   Alkaline Phosphatase 86 38 - 126 U/L   Total Bilirubin 1.3 (H) 0.3 - 1.2 mg/dL   GFR calc non Af Amer >60 >60 mL/min   GFR calc Af Amer >60 >60 mL/min    Comment: (NOTE) The eGFR has been calculated using the CKD EPI equation. This calculation has not been validated in all clinical situations. eGFR's persistently <60 mL/min signify possible Chronic Kidney Disease.    Anion gap 12 5 - 15     Comment: Performed at [facility], [place] [address], [place], [place] [address]  Troponin I     Status: None   Collection Time: 07/12/17  5:25 PM  Result Value Ref Range   Troponin I <0.03 <0.03 ng/mL    Comment: Performed at [facility], [place] [address], [place], [place] [address]  Troponin I (q 6hr x 3)     Status: None   Collection Time: 07/12/17  7:53 PM  Result Value Ref Range   Troponin I <0.03 <0.03 ng/mL    Comment: Performed at [facility], [place] [address], [place], [place] [address]   Ct Head Wo Contrast  Result Date: 07/12/2017 CLINICAL DATA:  Altered mental status.  Minimally verbal. EXAM: CT HEAD WITHOUT CONTRAST TECHNIQUE: Contiguous axial images were obtained from the base of the skull through the vertex without intravenous contrast. COMPARISON:  03/11/2017 FINDINGS: Brain: No evidence of acute infarction, hemorrhage, hydrocephalus, extra-axial collection or mass lesion/mass effect. Stable findings of chronic ischemic infarction of the left occipital lobe, and left basal ganglia. Moderate brain parenchymal volume loss and advanced deep white matter microangiopathy. Vascular: Calcific atherosclerotic disease at the skull base. Skull: Normal. Negative for fracture or focal lesion. Sinuses/Orbits: Polypoid mucosal thickening of the left maxillary sinus. Other: None. IMPRESSION: No acute intracranial abnormality. Stable findings of chronic ischemic infarctions of the left occipital lobe and left basal ganglia. Advanced brain parenchymal atrophy and chronic deep white matter microvascular disease. Electronically Signed   By: Fidela Salisbury M.D.   On: 07/12/2017 17:26   Dg Chest Port 1 View  Result Date: 07/12/2017 CLINICAL DATA:  Shortness of breath, vomiting. EXAM: PORTABLE CHEST 1 VIEW COMPARISON:  03/11/2017. FINDINGS: Trachea is midline. Heart size is grossly stable. Thoracic aorta is calcified. Lungs are somewhat low in  volume with perihilar airspace opacification, left greater than right. No definite pleural fluid. Left shoulder arthroplasty. IMPRESSION: 1. Perihilar airspace opacification may be due to edema or pneumonia. Aspiration is not excluded. 2.  Aortic atherosclerosis (ICD10-170.0). Electronically Signed   By: Lorin Picket M.D.   On: 07/12/2017 17:16    Pending Labs Unresulted Labs (From admission, onward)   Start     Ordered   07/12/17 1946  Troponin I (q 6hr x 3)  Now then every 6 hours,   R     07/12/17 1945   07/12/17 1647  Brain natriuretic peptide  Once,   R     07/12/17 1647   07/12/17 1647  Urinalysis, Routine w reflex microscopic  Once,   R     07/12/17 1647   Signed and Held  Culture, sputum-assessment  Once,   R    Question:  Patient immune status  Answer:  Normal   Signed and Held   Signed and Held  Gram stain  Once,   R    Question:  Patient immune status  Answer:  Normal   Signed and Held   Signed and Held  Strep pneumoniae urinary antigen  Once,   R     Signed and Held   Signed and Held  Magnesium  Tomorrow morning,   R    Comments:  Call MD if <1.5    Signed and Held   Signed and Held  Phosphorus  Tomorrow morning,   R     Signed and Held   Signed and Held  TSH  Once,   R    Comments:  Cancel if already done within 1 month and notify MD    Signed and Held   Signed and Held  Comprehensive metabolic panel  Once,   R    Comments:  Cal MD for K<3.5 or >5.0    Signed and Held   Visual merchandiser and Held  CBC  Once,   R    Comments:  Call for hg <8.0    Signed and Held   Signed and Held  Magnesium  Add-on,   R     Signed and Held   Signed and Held  Prealbumin  Tomorrow morning,   R     Signed and Held      Vitals/Pain Today's Vitals   07/12/17 1610 07/12/17 1800 07/12/17 2036  BP: 122/81 100/72 98/72  Pulse: 88 84 81  Resp: (!) 30 (!) 28 20  Temp: 98.8 F (37.1 C)    TempSrc: Oral    SpO2: 94% 94% 97%    Isolation Precautions No active  isolations  Medications Medications  morphine 2 MG/ML injection 1-2 mg (1 mg Intravenous Given 07/12/17 2052)  piperacillin-tazobactam (ZOSYN) IVPB 3.375 g (0 g Intravenous Stopped 07/12/17 1852)  vancomycin (VANCOCIN) IVPB 1000 mg/200 mL premix (0 mg Intravenous Stopped 07/12/17 1926)  sodium chloride 0.9 % bolus 500 mL (0 mLs Intravenous Stopped 07/12/17 2024)    Mobility non-ambulatory

## 2017-07-13 ENCOUNTER — Inpatient Hospital Stay (HOSPITAL_COMMUNITY): Payer: Medicare Other

## 2017-07-13 DIAGNOSIS — D72829 Elevated white blood cell count, unspecified: Secondary | ICD-10-CM

## 2017-07-13 DIAGNOSIS — Z66 Do not resuscitate: Secondary | ICD-10-CM

## 2017-07-13 DIAGNOSIS — Z515 Encounter for palliative care: Secondary | ICD-10-CM

## 2017-07-13 DIAGNOSIS — G9341 Metabolic encephalopathy: Principal | ICD-10-CM

## 2017-07-13 DIAGNOSIS — R627 Adult failure to thrive: Secondary | ICD-10-CM

## 2017-07-13 DIAGNOSIS — I4891 Unspecified atrial fibrillation: Secondary | ICD-10-CM

## 2017-07-13 LAB — TROPONIN I: Troponin I: 0.03 ng/mL (ref <0.03)

## 2017-07-13 LAB — URINALYSIS, ROUTINE W REFLEX MICROSCOPIC
BILIRUBIN URINE: NEGATIVE
Glucose, UA: NEGATIVE mg/dL
HGB URINE DIPSTICK: NEGATIVE
Ketones, ur: NEGATIVE mg/dL
Nitrite: NEGATIVE
PROTEIN: 100 mg/dL — AB
SPECIFIC GRAVITY, URINE: 1.024 (ref 1.005–1.030)
pH: 8 (ref 5.0–8.0)

## 2017-07-13 LAB — COMPREHENSIVE METABOLIC PANEL
ALT: 23 U/L (ref 17–63)
AST: 26 U/L (ref 15–41)
Albumin: 2.5 g/dL — ABNORMAL LOW (ref 3.5–5.0)
Alkaline Phosphatase: 75 U/L (ref 38–126)
Anion gap: 10 (ref 5–15)
BUN: 27 mg/dL — AB (ref 6–20)
CO2: 23 mmol/L (ref 22–32)
Calcium: 8.4 mg/dL — ABNORMAL LOW (ref 8.9–10.3)
Chloride: 105 mmol/L (ref 101–111)
Creatinine, Ser: 1.06 mg/dL (ref 0.61–1.24)
Glucose, Bld: 194 mg/dL — ABNORMAL HIGH (ref 65–99)
POTASSIUM: 3.9 mmol/L (ref 3.5–5.1)
Sodium: 138 mmol/L (ref 135–145)
TOTAL PROTEIN: 5.5 g/dL — AB (ref 6.5–8.1)
Total Bilirubin: 1.5 mg/dL — ABNORMAL HIGH (ref 0.3–1.2)

## 2017-07-13 LAB — PHOSPHORUS: PHOSPHORUS: 4.1 mg/dL (ref 2.5–4.6)

## 2017-07-13 LAB — CBC
HCT: 38.9 % — ABNORMAL LOW (ref 39.0–52.0)
Hemoglobin: 13.2 g/dL (ref 13.0–17.0)
MCH: 32 pg (ref 26.0–34.0)
MCHC: 33.9 g/dL (ref 30.0–36.0)
MCV: 94.2 fL (ref 78.0–100.0)
Platelets: 163 10*3/uL (ref 150–400)
RBC: 4.13 MIL/uL — ABNORMAL LOW (ref 4.22–5.81)
RDW: 16.7 % — ABNORMAL HIGH (ref 11.5–15.5)
WBC: 17.8 10*3/uL — AB (ref 4.0–10.5)

## 2017-07-13 LAB — MAGNESIUM
MAGNESIUM: 1.2 mg/dL — AB (ref 1.7–2.4)
Magnesium: 1.4 mg/dL — ABNORMAL LOW (ref 1.7–2.4)

## 2017-07-13 LAB — TSH: TSH: 0.57 u[IU]/mL (ref 0.350–4.500)

## 2017-07-13 LAB — STREP PNEUMONIAE URINARY ANTIGEN: Strep Pneumo Urinary Antigen: NEGATIVE

## 2017-07-13 LAB — PREALBUMIN: PREALBUMIN: 11.5 mg/dL — AB (ref 18–38)

## 2017-07-13 LAB — MRSA PCR SCREENING: MRSA BY PCR: NEGATIVE

## 2017-07-13 MED ORDER — DILTIAZEM HCL ER COATED BEADS 120 MG PO CP24
120.0000 mg | ORAL_CAPSULE | Freq: Every day | ORAL | Status: DC
  Administered 2017-07-13 – 2017-07-17 (×5): 120 mg via ORAL
  Filled 2017-07-13 (×5): qty 1

## 2017-07-13 MED ORDER — ORAL CARE MOUTH RINSE
15.0000 mL | Freq: Two times a day (BID) | OROMUCOSAL | Status: DC
  Administered 2017-07-13 – 2017-07-17 (×7): 15 mL via OROMUCOSAL

## 2017-07-13 MED ORDER — VANCOMYCIN HCL IN DEXTROSE 750-5 MG/150ML-% IV SOLN
750.0000 mg | INTRAVENOUS | Status: DC
  Administered 2017-07-13 – 2017-07-14 (×2): 750 mg via INTRAVENOUS
  Filled 2017-07-13 (×2): qty 150

## 2017-07-13 MED ORDER — ENSURE ENLIVE PO LIQD
237.0000 mL | Freq: Two times a day (BID) | ORAL | Status: DC
  Administered 2017-07-13 – 2017-07-14 (×3): 237 mL via ORAL

## 2017-07-13 MED ORDER — SODIUM CHLORIDE 0.9 % IV SOLN
INTRAVENOUS | Status: DC
  Administered 2017-07-14: 02:00:00 via INTRAVENOUS

## 2017-07-13 MED ORDER — MAGNESIUM SULFATE 2 GM/50ML IV SOLN
2.0000 g | Freq: Once | INTRAVENOUS | Status: AC
  Administered 2017-07-13: 2 g via INTRAVENOUS
  Filled 2017-07-13: qty 50

## 2017-07-13 MED ORDER — TRAMADOL HCL 50 MG PO TABS
50.0000 mg | ORAL_TABLET | Freq: Three times a day (TID) | ORAL | Status: DC | PRN
  Administered 2017-07-13 – 2017-07-17 (×7): 50 mg via ORAL
  Filled 2017-07-13 (×9): qty 1

## 2017-07-13 MED ORDER — HALOPERIDOL LACTATE 5 MG/ML IJ SOLN: 1.0000 mg | Freq: Four times a day (QID) | INTRAMUSCULAR | Status: DC | PRN

## 2017-07-13 MED ORDER — ATENOLOL 50 MG PO TABS
100.0000 mg | ORAL_TABLET | Freq: Every day | ORAL | Status: DC
  Administered 2017-07-13 – 2017-07-17 (×5): 100 mg via ORAL
  Filled 2017-07-13 (×5): qty 2

## 2017-07-13 NOTE — Evaluation (Signed)
Physical Therapy Evaluation Patient Details Name: Darryl Carroll MRN: [id number] DOB: 10-27-1927 Today's Date: 07/13/2017   History of Present Illness  84 yo male admitted with aspiration pna. hx of CVA, back pain, Parkinson's disease, dementia, vertebral fx, A fib, DM, CAD, gout. Pt is from a SNF.   Clinical Impression  Limited eval. Pt required Mod assist for partial completion of supine to sit and for return to supine. Pt was agitated and mildly combative with attempts to mobilize. Max encouragement provided however pt continued to refuse- "no, I don't want to." No family present during session. Per chart, pt is a long term SNF resident. Recommend return to SNF at discharge. Recommend daily OOB to chair with nursing as pt will allow.     Follow Up Recommendations (return to SNF)    Equipment Recommendations       Recommendations for Other Services       Precautions / Restrictions Precautions Precautions: Fall Precaution Comments: agitated, mildly combative Restrictions Weight Bearing Restrictions: No      Mobility  Bed Mobility Overal bed mobility: Needs Assistance Bed Mobility: Supine to Sit;Sit to Supine     Supine to sit: Mod assist;+2 for safety/equipment;HOB elevated Sit to supine: Mod assist;+2 for safety/equipment;HOB elevated   General bed mobility comments: Assist for bil LEs off of bed. Pt reached for and grabbed hold of bedrail. Attempted to have pt sit up on EOB however pt was resistant and stated "no. I don't want to." Encouraged pt to keep trying however he continued to refuse. Assisted pt's legs back onto bed and repositioned pt.   Transfers                    Ambulation/Gait                Stairs            Wheelchair Mobility    Modified Rankin (Stroke Patients Only)       Balance                                             Pertinent Vitals/Pain Pain Assessment: Faces Faces Pain Scale: Hurts little  more Pain Location: noted grimacing. pt would not state location of pain/discomfort Pain Descriptors / Indicators: Grimacing Pain Intervention(s): Limited activity within patient's tolerance;Repositioned    Home Living Family/patient expects to be discharged to:: Skilled nursing facility                      Prior Function Level of Independence: Needs assistance   Gait / Transfers Assistance Needed: using w/c to get around prior to admission per chart           Hand Dominance        Extremity/Trunk Assessment   Upper Extremity Assessment Upper Extremity Assessment: Difficult to assess due to impaired cognition    Lower Extremity Assessment Lower Extremity Assessment: Difficult to assess due to impaired cognition    Cervical / Trunk Assessment Cervical / Trunk Assessment: Kyphotic  Communication   Communication: Other (comment);No difficulties(slow to respond verbally at times)  Cognition Arousal/Alertness: Awake/alert Behavior During Therapy: Agitated(mildly combative) Overall Cognitive Status: No family/caregiver present to determine baseline cognitive functioning Area of Impairment: Orientation;Memory;Problem solving;Safety/judgement                 Orientation Level: Disoriented  to;Situation;Place;Time   Memory: Decreased short-term memory;Decreased recall of precautions   Safety/Judgement: Decreased awareness of safety;Decreased awareness of deficits   Problem Solving: Requires verbal cues;Requires tactile cues;Slow processing;Difficulty sequencing;Decreased initiation        General Comments      Exercises     Assessment/Plan    PT Assessment Patent does not need any further PT services  PT Problem List         PT Treatment Interventions      PT Goals (Current goals can be found in the Care Plan section)  Acute Rehab PT Goals Patient Stated Goal: none stated PT Goal Formulation: All assessment and education complete, DC  therapy    Frequency     Barriers to discharge        Co-evaluation               AM-PAC PT "6 Clicks" Daily Activity  Outcome Measure Difficulty turning over in bed (including adjusting bedclothes, sheets and blankets)?: Unable Difficulty moving from lying on back to sitting on the side of the bed? : Unable Difficulty sitting down on and standing up from a chair with arms (e.g., wheelchair, bedside commode, etc,.)?: Unable Help needed moving to and from a bed to chair (including a wheelchair)?: Total Help needed walking in hospital room?: Total Help needed climbing 3-5 steps with a railing? : Total 6 Click Score: 6    End of Session   Activity Tolerance: Treatment limited secondary to agitation Patient left: in bed;with call bell/phone within reach;with bed alarm set        Time: 0981-1914 PT Time Calculation (min) (ACUTE ONLY): 8 min   Charges:   PT Evaluation $PT Eval Moderate Complexity: 1 Mod     PT G Codes:         Rebeca Alert, MPT Pager: [phone]

## 2017-07-13 NOTE — Progress Notes (Signed)
Initial Nutrition Assessment  DOCUMENTATION CODES:   Underweight  INTERVENTION:   Provide Ensure Enlive po BID, each supplement provides 350 kcal and 20 grams of protein  NUTRITION DIAGNOSIS:   Inadequate oral intake related to dysphagia, nausea, vomiting as evidenced by per patient/family report.  GOAL:   Patient will meet greater than or equal to 90% of their needs   MONITOR:   PO intake, Supplement acceptance, Labs, Weight trends, Skin, I & O's  REASON FOR ASSESSMENT:   Consult Malnutrition Eval  ASSESSMENT:   84 y.o. male with medical history significant of CAD, DM 2, gout, HTN, HLD, paroxysmal atrial fibrillation, OSA, chronic back pain, peptic ulcer disease, parkinson disease  Patient unable to provide history at bedside, sleeping and with history of dementia. No family present. Per chart review, pt admitted for N/V. Suspected dysphagia, SLP to evaluate. Palliative care has also been consulted for GOC, family has stated they do not want aggressive treatments. Family has also stated they want a liberalized diet for the patient regardless of the aspiration risk. Pt currently on full liquids, will monitor for results of swallow eval.  Per chart review, pt has lost 14 lb since 1/4 (11% wt loss x 4 months, significant for time frame).   Medications reviewed.  Labs reviewed:  Low Mg, Phos WNL  NUTRITION - FOCUSED PHYSICAL EXAM:    Most Recent Value  Orbital Region  No depletion  Upper Arm Region  Mild depletion  Thoracic and Lumbar Region  Unable to assess  Buccal Region  Mild depletion  Temple Region  Mild depletion  Clavicle Bone Region  Severe depletion  Clavicle and Acromion Bone Region  Severe depletion  Scapular Bone Region  Unable to assess  Dorsal Hand  No depletion  Patellar Region  Unable to assess  Anterior Thigh Region  Unable to assess  Posterior Calf Region  Unable to assess  Edema (RD Assessment)  None       Diet Order:   Diet Order       Diet full liquid Room service appropriate? Yes; Fluid consistency: Thin  Diet effective now          EDUCATION NEEDS:   Not appropriate for education at this time  Skin:  Skin Assessment: Reviewed RN Assessment  Last BM:  PTA  Height:   Ht Readings from Last 1 Encounters:  07/13/17  (1.676 m)    Weight:   Wt Readings from Last 1 Encounters:  07/13/17 113 lb 12.1 oz (51.6 kg)    Ideal Body Weight:  64.5 kg  BMI:  Body mass index is 18.36 kg/m.  Estimated Nutritional Needs:   Kcal:  1400-1600  Protein:  65-75g  Fluid:  1.6L/day  Tilda Franco, MS, RD, LDN [facility] Inpatient Clinical Dietitian Pager: [phone] After Hours Pager: [phone]

## 2017-07-13 NOTE — Care Management Note (Signed)
Case Management Note  Patient Details  Name: Darryl Carroll MRN: [id number] Date of Birth: Mar 21, 1927  Subjective/Objective: PT recc SNF-CSW notified.                   Action/Plan:d/c SNF.   Expected Discharge Date:                  Expected Discharge Plan:  Skilled Nursing Facility  In-House Referral:  Clinical Social Work  Discharge planning Services  CM Consult  Post Acute Care Choice:    Choice offered to:     DME Arranged:    DME Agency:     HH Arranged:    HH Agency:     Status of Service:  In process, will continue to follow  If discussed at Long Length of Stay Meetings, dates discussed:    Additional Comments:  Lanier Clam, RN 07/13/2017, 11:45 AM

## 2017-07-13 NOTE — Consult Note (Signed)
Consultation Note Date: 07/13/2017   Patient Name: Darryl Carroll  DOB: [birth year]  MRN: [id number]  Age / Sex: 84 y.o., male  PCP: Merlene Laughter, MD Referring Physician: Calvert Cantor, MD  Reason for Consultation: Establishing goals of care and Psychosocial/spiritual support  HPI/Patient Profile: 84 y.o. male   admitted on 07/12/2017 with a past medical history significant of CAD, DM 2, gout, HTN, HLD, paroxysmal atrial fibrillation, OSA, chronic back pain, peptic ulcer disease, parkinson disease/dementia who presented with nausea and vomiting and more confused than his baseline.  Family report conitnued physical, functional and cognitive decline over the past year.    Son tells me patient was scheduled tomorrow OP  for MRI of spine 2/2 to increasing pain and compression fx   Family face treatment option decisions, advanced directive decisions and anticipatory care needs    Clinical Assessment and Goals of Care:   This NP Lorinda Creed reviewed medical records, received report from team, assessed the patient and then meet at the patient's bedside along with his wife and son  to discuss diagnosis, prognosis, GOC, EOL wishes disposition and options.  Concept of Hospice and Palliative Care were discussed  A detailed discussion was had today regarding advanced directives.  Concepts specific to code status, artifical feeding and hydration, continued IV antibiotics and rehospitalization was had.  Discussed the difference between an aggressive medical intervention path vs a comfort path for this patient at this time in this situation.  Discussed concept of mortality and failure to thrive.  MOST form introduced  Natural trajectory and expectations at EOL were discussed.  Questions and concerns addressed.   Family encouraged to call with questions or concerns.    PMT will continue to support  holistically.   NEXT OF KIN- no documented HPOA or AD    SUMMARY OF RECOMMENDATIONS    Code Status/Advance Care Planning:  DNR  Palliative Prophylaxis:   Aspiration, Bowel Regimen, Frequent Pain Assessment and Oral Care  Additional Recommendations (Limitations, Scope, Preferences):  For now family request to treat the treatable and hope for return to baseline.  He is dependant for all ADLs at baseline.  When medically stable transition back to SNF with palliative services  Continue to define limits of care depending on patient outcomes, comfort and dignity is  priority  Psycho-social/Spiritual:   Desire for further Chaplaincy support:yes  Additional Recommendations: Education on Hospice  Prognosis:   Unable to determine  Discharge Planning: To Be Determined      Primary Diagnoses: Present on Admission: . Coronary atherosclerosis . Dementia due to Parkinson's disease without behavioral disturbance (HCC) . FIBRILLATION, ATRIAL . Hyperlipidemia . Hypertension . PD (Parkinson's disease) (HCC) . Sleep apnea . Hypokalemia . Aspiration pneumonia (HCC)   I have reviewed the medical record, interviewed the patient and family, and examined the patient. The following aspects are pertinent.  Past Medical History:  Diagnosis Date  . CAD (coronary artery disease)    s/p multiple PCIs in all 3 vessels with DES. Last PCI 2005 with overlapping  Cypher stents in CFX bifurcation lesion  . DM (diabetes mellitus), type 2 (HCC)   . Glaucoma   . Gout   . HTN (hypertension)    not under good control  . Hyperlipidemia   . Macular degeneration   . Obesity   . Osteoporosis   . Paroxysmal atrial fibrillation (HCC)   . Peptic ulcer disease   . Sleep apnea   . Spinal stenosis    Social History   Socioeconomic History  . Marital status: Married    Spouse name: Not on file  . Number of children: Not on file  . Years of education: Not on file  . Highest education level:  Not on file  Occupational History  . Not on file  Social Needs  . Financial resource strain: Not on file  . Food insecurity:    Worry: Not on file    Inability: Not on file  . Transportation needs:    Medical: Not on file    Non-medical: Not on file  Tobacco Use  . Smoking status: Former Smoker    Types: Cigarettes    Last attempt to quit: 10/03/1983    Years since quitting: 33.8  . Smokeless tobacco: Never Used  Substance and Sexual Activity  . Alcohol use: No    Alcohol/week: 0.0 oz  . Drug use: No  . Sexual activity: Not on file  Lifestyle  . Physical activity:    Days per week: Not on file    Minutes per session: Not on file  . Stress: Not on file  Relationships  . Social connections:    Talks on phone: Not on file    Gets together: Not on file    Attends religious service: Not on file    Active member of club or organization: Not on file    Attends meetings of clubs or organizations: Not on file    Relationship status: Not on file  Other Topics Concern  . Not on file  Social History Narrative  . Not on file   Family History  Problem Relation Age of Onset  . Heart attack Father        deceased 81  . Diabetes Mother        deceased 60  . Heart attack Brother        deceased 54   Scheduled Meds: . carbidopa-levodopa  1 tablet Oral TID  . mouth rinse  15 mL Mouth Rinse BID  . Rivaroxaban  15 mg Oral QHS  . sertraline  100 mg Oral Daily   Continuous Infusions: . magnesium sulfate 1 - 4 g bolus IVPB    . piperacillin-tazobactam (ZOSYN)  IV 3.375 g (07/13/17 0823)  . vancomycin 750 mg (07/13/17 0823)   PRN Meds:.acetaminophen **OR** acetaminophen, albuterol, morphine injection, ondansetron **OR** ondansetron (ZOFRAN) IV Medications Prior to Admission:  Prior to Admission medications   Medication Sig Start Date End Date Taking? Authorizing Provider  acetaminophen (TYLENOL) 325 MG tablet Take 650 mg by mouth every 6 (six) hours.   Yes [provider]  acyclovir (ZOVIRAX) 400 MG tablet Take 400 mg by mouth 2 (two) times daily.   Yes [provider]  atenolol (TENORMIN) 100 MG tablet Take 1 tablet (100 mg total) by mouth daily. 08/30/13  Yes Kathleene Hazel, MD  atorvastatin (LIPITOR) 40 MG tablet Take 1 tablet (40 mg total) by mouth daily at 6 PM. 03/13/17  Yes Rhetta Mura, MD  carbidopa-levodopa (SINEMET IR) 25-100 MG tablet  Take 1 tablet by mouth 3 (three) times daily. 03/14/17  Yes [provider]  Cholecalciferol (VITAMIN D-3) 1000 units CAPS Take 1,000 Units by mouth daily.   Yes [provider]  diltiazem (CARDIZEM CD) 120 MG 24 hr capsule Take 120 mg by mouth daily.   Yes [provider]  doxycycline (VIBRAMYCIN) 100 MG capsule Take 100 mg by mouth 2 (two) times daily.   Yes [provider]  Melatonin 3 MG TABS Take 3 mg by mouth at bedtime.   Yes [provider]  metFORMIN (GLUCOPHAGE) 500 MG tablet Take 500 mg by mouth daily with breakfast.   Yes [provider]  Multiple Vitamins-Minerals (MULTIVITAMIN WITH MINERALS) tablet Take 1 tablet by mouth daily.   Yes [provider]  Probiotic Product (RISA-BID PROBIOTIC PO) Take by mouth.   Yes [provider]  Rivaroxaban (XARELTO) 15 MG TABS tablet Take 15 mg by mouth at bedtime.    Yes [provider]  sertraline (ZOLOFT) 100 MG tablet Take 100 mg by mouth daily.    Yes [provider]  traMADol (ULTRAM) 50 MG tablet Take 50 mg by mouth every 8 (eight) hours as needed for moderate pain.   Yes [provider]  aspirin 81 MG EC tablet Take 1 tablet (81 mg total) by mouth daily. Patient not taking: Reported on 07/12/2017 03/14/17   Rhetta Mura, MD   Allergies  Allergen Reactions  . Contrast Media [Iodinated Diagnostic Agents] Other (See Comments)    Reaction when stints put in  . Other     Bee stings  . Zolpidem Other (See Comments)    unknown   Review of  Systems  Unable to perform ROS: Dementia    Physical Exam  Constitutional: He appears cachectic. He appears ill.  Cardiovascular: Normal rate, regular rhythm and normal heart sounds.  Pulmonary/Chest: He has decreased breath sounds.  Neurological:  Easily agitated and confused  Skin: Skin is warm and dry.  Psychiatric: He is agitated. Cognition and memory are impaired.    Vital Signs: BP (!) 125/91 (BP Location: Left Arm)   Pulse 84   Temp 97.6 F (36.4 C) (Oral)   Resp 16   Ht  (1.676 m)   Wt 51.6 kg (113 lb 12.1 oz)   SpO2 99%   BMI 18.36 kg/m  Pain Scale: 0-10   Pain Score: 0-No pain   SpO2: SpO2: 99 % O2 Device:SpO2: 99 % O2 Flow Rate: .O2 Flow Rate (L/min): 3 L/min  IO: Intake/output summary:   Intake/Output Summary (Last 24 hours) at 07/13/2017 1031 Last data filed at 07/13/2017 0600 Gross per 24 hour  Intake 1530.13 ml  Output -  Net 1530.13 ml    LBM: Last BM Date: (unknown) Baseline Weight: Weight: 51.6 kg (113 lb 12.1 oz) Most recent weight: Weight: 51.6 kg (113 lb 12.1 oz)     Palliative Assessment/Data: 30% at best   Discussed with Dr Butler Denmark  Time In:  1200 Time Out: 1315 Time Total: 75 minutes Greater than 50%  of this time was spent counseling and coordinating care related to the above assessment and plan.  Signed by: Lorinda Creed, NP   Please contact Palliative Medicine Team phone at [phone] for questions and concerns.  For individual provider: See Loretha Stapler

## 2017-07-13 NOTE — Progress Notes (Signed)
Modified Barium Swallow Progress Note  Patient Details  Name: Darryl Carroll MRN: [id number] Date of Birth: 1927-10-14  Today's Date: 07/13/2017  Modified Barium Swallow completed.  Full report located under Chart Review in the Imaging Section.  Brief recommendations include the following:  Clinical Impression  Difficult view due to pt's positioning - therefore at times larynx, trachea was not visualized.  Pt presents with moderate oropharyngeal and cervical esophageal dysphagia with sensorimotor deficits.  Also suspect esophageal component to his dysphagia.  Patient with decreased bolus cohesion resulting in premature spillage into pharynx and oral residuals which pt will swallow - albeit can be significantly delayed.  Trace laryngeal penetration and trace aspiration of secretions mixed with barium noted due to decreased timing of laryngeal closure discoordination difficulties.  Pt did not sense trace penetration/aspiration.  Residuals in pharynx mixed with pharyngeal secretions and did not fully clear during study.  Chin tuck posture helpful to prevent penetration but uncertain if will consistently help.   Upon esophageal sweep, pt appeared with stasis - without awareness.  Barium tablet then given with pudding - appeared to lodge at aortic arch.  Liquids (thin barium and water) given to aid transiting resulted in backflow to proximal cervical esophagus without pt sensation.  Suspect this issue is a large contributing factor to pt's possible aspiration.  Of note, pt is kyphotic this likely further compromises his esophageal clearance.  Recommend dys3/ground meats/thin with strict precautions.     Swallow Evaluation Recommendations   Recommended Consults: Consider esophageal assessment   SLP Diet Recommendations: Dysphagia 3 (Mech soft) solids;Thin liquid   Liquid Administration via: Straw   Medication Administration: Whole meds with puree(start and follow with liquids)   Supervision: Full  supervision/cueing for compensatory strategies   Compensations: Slow rate;Small sips/bites;Follow solids with liquid;Other (Comment);Chin tuck(small frequent meals)   Postural Changes: Seated upright at 90 degrees;Remain semi-upright after after feeds/meals (Comment)   Oral Care Recommendations: Oral care QID   Other Recommendations: Have oral suction available   Donavan Burnet, MS [facility] SLP [phone]  Chales Abrahams 07/13/2017,4:02 PM

## 2017-07-13 NOTE — Progress Notes (Addendum)
PROGRESS NOTE    Darryl Carroll   ZOX:[id number]  DOB: [birth year]  DOA: 07/12/2017 PCP: Merlene Laughter, MD   Brief Narrative:  Darryl Carroll is a 84 y.o. male with medical history significant of CAD, DM 2, gout, HTN, HLD, paroxysmal atrial fibrillation, OSA, chronic back pain, peptic ulcer disease, parkinson disease with dementia who presents from [facility] for lethargy after vomiting up lunch.  CXR >Perihilar airspace opacification may be due to edema or pneumonia. Aspiration is not excluded.  It was suspected that he aspirated and he was started on Vanc and Zosyn for HCAP>   Subjective: Was quite sleepy this AM despite me tapping on his shoulder and then putting the head of the bed up. Apparently he later became alert and combative.     Assessment & Plan:   Active Problems: Leukocytosis/ lethargy - acute metabolic encephalopathy - CXR today shows no consolidation and therefore, he does not have a pneumonia - he does have a + UA and I have ordered a culture keeping in mind that he is already on antibiotics - Lethargy appears to have resolved - follow    PD (Parkinson's disease) (HCC)   Dementia due to Parkinson's disease with  behavioral disturbance - continue Sinemet and Zoloft- add PRN Haldol    Vomiting - full liquids for now- cont IVF until drinking well     FIBRILLATION, ATRIAL   Long term (current) use of anticoagulants - Xarelto - resume Cardizem today and Atenolol tomorrow  DM2 - hold Metformin cont SSI  Disposition- poor quality of life- appreciate palliative care eval   DVT prophylaxis: Xarelto Code Status: DNR Family Communication:  Disposition Plan: return to SNF Consultants:   Palliative care Procedures:    Antimicrobials:  Anti-infectives (From admission, onward)   Start     Dose/Rate Route Frequency Ordered Stop   07/13/17 0800  vancomycin (VANCOCIN) IVPB 750 mg/150 ml premix     750 mg 150 mL/hr over 60 Minutes Intravenous Every 24  hours 07/13/17 0318     07/12/17 2359  piperacillin-tazobactam (ZOSYN) IVPB 3.375 g     3.375 g 12.5 mL/hr over 240 Minutes Intravenous Every 8 hours 07/12/17 2210     07/12/17 1745  piperacillin-tazobactam (ZOSYN) IVPB 3.375 g     3.375 g 100 mL/hr over 30 Minutes Intravenous  Once 07/12/17 1740 07/12/17 1852   07/12/17 1745  vancomycin (VANCOCIN) IVPB 1000 mg/200 mL premix     1,000 mg 200 mL/hr over 60 Minutes Intravenous  Once 07/12/17 1740 07/12/17 1926       Objective: Vitals:   07/12/17 2216 07/13/17 0251 07/13/17 0454 07/13/17 1350  BP: 99/72  (!) 125/91 (!) 128/91  Pulse: 76  84 96  Resp: Temp: 98.6 F (37 C)  97.6 F (36.4 C) 97.6 F (36.4 C)  TempSrc: Oral  Oral Oral  SpO2: 97%  99% 95%  Weight:  51.6 kg (113 lb 12.1 oz)    Height:   (1.676 m)      Intake/Output Summary (Last 24 hours) at 07/13/2017 1457 Last data filed at 07/13/2017 1315 Gross per 24 hour  Intake 2505.13 ml  Output -  Net 2505.13 ml   Filed Weights   07/13/17 0251  Weight: 51.6 kg (113 lb 12.1 oz)    Examination: General exam: Appears comfortable - asleep HEENT: PERRLA, oral mucosa moist, no sclera icterus or thrush Respiratory system: Clear to auscultation. Respiratory effort normal. Cardiovascular system: S1 &  S2 heard, RRR.   Gastrointestinal system: Abdomen soft, non-tender, nondistended. Normal bowel sound. No organomegaly Extremities: No cyanosis, clubbing or edema Skin: No rashes or ulcers      Data Reviewed: I have personally reviewed following labs and imaging studies  CBC: Recent Labs  Lab 07/12/17 1725 07/13/17 0144  WBC 16.1* 17.8*  NEUTROABS 14.9*  --   HGB 14.9 13.2  HCT 45.0 38.9*  MCV 93.4 94.2  PLT 192 163   Basic Metabolic Panel: Recent Labs  Lab 07/12/17 1724 07/12/17 1725 07/13/17 0144  NA  --  139 138  K  --  3.5 3.9  CL  --  104 105  CO2  --  23 23  GLUCOSE  --  178* 194*  BUN  --  24* 27*  CREATININE  --  0.87 1.06  CALCIUM   --  8.8* 8.4*  MG 1.2*  --  1.4*  PHOS  --   --  4.1   GFR: Estimated Creatinine Clearance: 35.2 mL/min (by C-G formula based on SCr of 1.06 mg/dL). Liver Function Tests: Recent Labs  Lab 07/12/17 1725 07/13/17 0144  AST 29 26  ALT 24 23  ALKPHOS 86 75  BILITOT 1.3* 1.5*  PROT 5.8* 5.5*  ALBUMIN 2.7* 2.5*   No results for input(s): LIPASE, AMYLASE in the last 168 hours. No results for input(s): AMMONIA in the last 168 hours. Coagulation Profile: No results for input(s): INR, PROTIME in the last 168 hours. Cardiac Enzymes: Recent Labs  Lab 07/12/17 1725 07/12/17 1953 07/13/17 0144 07/13/17 0841  TROPONINI <0.03 <0.03 <0.03 0.03*   BNP (last 3 results) No results for input(s): PROBNP in the last 8760 hours. HbA1C: No results for input(s): HGBA1C in the last 72 hours. CBG: No results for input(s): GLUCAP in the last 168 hours. Lipid Profile: No results for input(s): CHOL, HDL, LDLCALC, TRIG, CHOLHDL, LDLDIRECT in the last 72 hours. Thyroid Function Tests: Recent Labs    07/13/17 0144  TSH 0.570   Anemia Panel: No results for input(s): VITAMINB12, FOLATE, FERRITIN, TIBC, IRON, RETICCTPCT in the last 72 hours. Urine analysis:    Component Value Date/Time   COLORURINE AMBER (A) 07/13/2017 0445   APPEARANCEUR CLOUDY (A) 07/13/2017 0445   LABSPEC 1.024 07/13/2017 0445   PHURINE 8.0 07/13/2017 0445   GLUCOSEU NEGATIVE 07/13/2017 0445   HGBUR NEGATIVE 07/13/2017 0445   BILIRUBINUR NEGATIVE 07/13/2017 0445   KETONESUR NEGATIVE 07/13/2017 0445   PROTEINUR 100 (A) 07/13/2017 0445   UROBILINOGEN 0.2 10/23/2014 1120   NITRITE NEGATIVE 07/13/2017 0445   LEUKOCYTESUR LARGE (A) 07/13/2017 0445   Sepsis Labs: (procalcitonin:4,lacticidven:4) ) Recent Results (from the past 240 hour(s))  MRSA PCR Screening     Status: None   Collection Time: 07/13/17  8:41 AM  Result Value Ref Range Status   MRSA by PCR NEGATIVE NEGATIVE Final    Comment:        The  GeneXpert MRSA Assay (FDA approved for NASAL specimens only), is one component of a comprehensive MRSA colonization surveillance program. It is not intended to diagnose MRSA infection nor to guide or monitor treatment for MRSA infections. Performed at [facility], [address] [place], [place] [address]          Radiology Studies: Ct Head Wo Contrast  Result Date: 07/12/2017 CLINICAL DATA:  Altered mental status.  Minimally verbal. EXAM: CT HEAD WITHOUT CONTRAST TECHNIQUE: Contiguous axial images were obtained from the base of the skull through the  vertex without intravenous contrast. COMPARISON:  03/11/2017 FINDINGS: Brain: No evidence of acute infarction, hemorrhage, hydrocephalus, extra-axial collection or mass lesion/mass effect. Stable findings of chronic ischemic infarction of the left occipital lobe, and left basal ganglia. Moderate brain parenchymal volume loss and advanced deep white matter microangiopathy. Vascular: Calcific atherosclerotic disease at the skull base. Skull: Normal. Negative for fracture or focal lesion. Sinuses/Orbits: Polypoid mucosal thickening of the left maxillary sinus. Other: None. IMPRESSION: No acute intracranial abnormality. Stable findings of chronic ischemic infarctions of the left occipital lobe and left basal ganglia. Advanced brain parenchymal atrophy and chronic deep white matter microvascular disease. Electronically Signed   By: Ted Mcalpine M.D.   On: 07/12/2017 17:26   Dg Chest Port 1 View  Result Date: 07/12/2017 CLINICAL DATA:  Shortness of breath, vomiting. EXAM: PORTABLE CHEST 1 VIEW COMPARISON:  03/11/2017. FINDINGS: Trachea is midline. Heart size is grossly stable. Thoracic aorta is calcified. Lungs are somewhat low in volume with perihilar airspace opacification, left greater than right. No definite pleural fluid. Left shoulder arthroplasty. IMPRESSION: 1. Perihilar airspace opacification may be due to edema or  pneumonia. Aspiration is not excluded. 2.  Aortic atherosclerosis (ICD10-170.0). Electronically Signed   By: Leanna Battles M.D.   On: 07/12/2017 17:16      Scheduled Meds: . carbidopa-levodopa  1 tablet Oral TID  . feeding supplement (ENSURE ENLIVE)  237 mL Oral BID BM  . mouth rinse  15 mL Mouth Rinse BID  . Rivaroxaban  15 mg Oral QHS  . sertraline  100 mg Oral Daily   Continuous Infusions: . sodium chloride    . piperacillin-tazobactam (ZOSYN)  IV Stopped (07/13/17 1223)  . vancomycin Stopped (07/13/17 0923)     LOS: 1 day    Time spent in minutes: 35    Calvert Cantor, MD [facility] Pager: [email or web address] Password [facility] 07/13/2017, 2:57 PM Dr. Milus Banister

## 2017-07-14 ENCOUNTER — Other Ambulatory Visit: Payer: Medicare Other

## 2017-07-14 ENCOUNTER — Encounter (HOSPITAL_COMMUNITY): Payer: Self-pay | Admitting: *Deleted

## 2017-07-14 ENCOUNTER — Inpatient Hospital Stay
Admission: RE | Admit: 2017-07-14 | Discharge: 2017-07-14 | Disposition: A | Discharge status: Home / Self Care | Payer: Medicare Other | Source: Ambulatory Visit | Attending: Neurosurgery | Admitting: Neurosurgery

## 2017-07-14 ENCOUNTER — Other Ambulatory Visit: Payer: Self-pay

## 2017-07-14 ENCOUNTER — Inpatient Hospital Stay (HOSPITAL_COMMUNITY): Payer: Medicare Other

## 2017-07-14 LAB — URINALYSIS, ROUTINE W REFLEX MICROSCOPIC
BILIRUBIN URINE: NEGATIVE
Glucose, UA: NEGATIVE mg/dL
Ketones, ur: NEGATIVE mg/dL
Nitrite: NEGATIVE
PH: 5 (ref 5.0–8.0)
Protein, ur: NEGATIVE mg/dL
SPECIFIC GRAVITY, URINE: 1.023 (ref 1.005–1.030)

## 2017-07-14 LAB — BASIC METABOLIC PANEL
ANION GAP: 8 (ref 5–15)
BUN: 23 mg/dL — ABNORMAL HIGH (ref 6–20)
CALCIUM: 8.4 mg/dL — AB (ref 8.9–10.3)
CO2: 23 mmol/L (ref 22–32)
Chloride: 109 mmol/L (ref 101–111)
Creatinine, Ser: 0.89 mg/dL (ref 0.61–1.24)
GFR calc Af Amer: >60 mL/min (ref >60)
GFR calc non Af Amer: >60 mL/min (ref >60)
Glucose, Bld: 138 mg/dL — ABNORMAL HIGH (ref 65–99)
POTASSIUM: 3.1 mmol/L — AB (ref 3.5–5.1)
Sodium: 140 mmol/L (ref 135–145)

## 2017-07-14 LAB — CBC
HCT: 35.7 % — ABNORMAL LOW (ref 39.0–52.0)
HEMOGLOBIN: 11.8 g/dL — AB (ref 13.0–17.0)
MCH: 31.5 pg (ref 26.0–34.0)
MCHC: 33.1 g/dL (ref 30.0–36.0)
MCV: 95.2 fL (ref 78.0–100.0)
PLATELETS: 147 10*3/uL — AB (ref 150–400)
RBC: 3.75 MIL/uL — AB (ref 4.22–5.81)
RDW: 16.9 % — ABNORMAL HIGH (ref 11.5–15.5)
WBC: 8.9 10*3/uL (ref 4.0–10.5)

## 2017-07-14 LAB — MAGNESIUM: Magnesium: 1.7 mg/dL (ref 1.7–2.4)

## 2017-07-14 MED ORDER — ALUM & MAG HYDROXIDE-SIMETH 200-200-20 MG/5ML PO SUSP
30.0000 mL | ORAL | Status: DC | PRN
  Administered 2017-07-14: 30 mL via ORAL
  Filled 2017-07-14: qty 30

## 2017-07-14 NOTE — Progress Notes (Signed)
CSW contacted patient's wife to complete assessment ([phone]; [phone]) no answer, CSW left voicemail requesting return phone call. Per patient's RN, patient currently disoriented and unable to participate in assessment. Per chart review, patient from [facility].CSW will continue to try to reach patient's wife to complete assessment.   Celso Sickle, [place] Clinical Social Worker [facility] Cell#: [phone]

## 2017-07-14 NOTE — Progress Notes (Addendum)
PROGRESS NOTE    Darryl Carroll   ZOX:[id number]  DOB: 03-27-1927  DOA: 07/12/2017 PCP: Merlene Laughter, MD   Brief Narrative:  Darryl Carroll is a 84 y.o. male with medical history significant of CAD, DM 2, gout, HTN, HLD, paroxysmal atrial fibrillation, OSA, chronic back pain, peptic ulcer disease, parkinson disease with dementia who presents from [facility] for lethargy after vomiting up lunch.  CXR >Perihilar airspace opacification may be due to edema or pneumonia. Aspiration is not excluded.  It was suspected that he aspirated and he was started on Vanc and Zosyn for HCAP>   Subjective: Alart, calm and quite confused to time place and situation. Complains of back pain in the middle of his back when asked to sit up to allow lung exam.     Assessment & Plan:   Active Problems: Leukocytosis/ lethargy - acute metabolic encephalopathy - repeat CXR shows no consolidation and therefore, he does not have a pneumonia - he does have a + UA and I have ordered a culture on 5/6 keeping in mind that he is already on antibiotics - Lethargy appears to have resolved and I am no sure if it was related to a UTI   - culture is pending but repeat UA shows WBC dropped from large to moderate, ongoing microscopic hematuria and rare bacteria - will cont to treat as a UTI- reasssed for hematuria once UTI resolved (on Xarelto) - MRSA PCR neg- stop Vanc  Dysphagia - SLP notes high risk for aspiration but has recommended d 3 diet with clear liquids and Is recommending an esophageal assessment- No further work up- family wants him to eat regardless  Back pain - due for Lumbar MRI today- I have ordered for it to be done in the hospital  Vomiting - full liquids for now- advance to solid food today- d.c IVF    PD (Parkinson's disease) (HCC)   Dementia due to Parkinson's disease with  behavioral disturbance - continue Sinemet and Zoloft-       FIBRILLATION, ATRIAL   Long term (current) use of  anticoagulants - Xarelto - resume Cardizem and Atenolol    DM2 - hold Metformin cont SSI  Disposition- poor quality of life- appreciate palliative care eval   DVT prophylaxis: Xarelto Code Status: DNR Family Communication:  Disposition Plan: follow on regular diet, f/u MRI- change Abx to oral tomorrow and  return to SNF Consultants:   Palliative care Procedures:    Antimicrobials:  Anti-infectives (From admission, onward)   Start     Dose/Rate Route Frequency Ordered Stop   07/13/17 0800  vancomycin (VANCOCIN) IVPB 750 mg/150 ml premix  Status:  Discontinued     750 mg 150 mL/hr over 60 Minutes Intravenous Every 24 hours 07/13/17 0318 07/14/17 0854   07/12/17 2359  piperacillin-tazobactam (ZOSYN) IVPB 3.375 g     3.375 g 12.5 mL/hr over 240 Minutes Intravenous Every 8 hours 07/12/17 2210     07/12/17 1745  piperacillin-tazobactam (ZOSYN) IVPB 3.375 g     3.375 g 100 mL/hr over 30 Minutes Intravenous  Once 07/12/17 1740 07/12/17 1852   07/12/17 1745  vancomycin (VANCOCIN) IVPB 1000 mg/200 mL premix     1,000 mg 200 mL/hr over 60 Minutes Intravenous  Once 07/12/17 1740 07/12/17 1926       Objective: Vitals:   07/13/17 2153 07/14/17 0716 07/14/17 0809 07/14/17 1352  BP: 103/62 (!) 156/101 (!) 150/90 (!) 136/92  Pulse: 74 83  70  Resp: 17 17  17  Temp: 97.6 F (36.4 C) 97.6 F (36.4 C)  98 F (36.7 C)  TempSrc: Oral Oral  Oral  SpO2: 95% 96%  93%  Weight:      Height:        Intake/Output Summary (Last 24 hours) at 07/14/2017 1506 Last data filed at 07/14/2017 1259 Gross per 24 hour  Intake 2854.99 ml  Output 475 ml  Net 2379.99 ml   Filed Weights   07/13/17 0251  Weight: 51.6 kg (113 lb 12.1 oz)    Examination: General exam: Appears comfortable - very confused but calm -due to back pain will not sit up to allow me to examine his back   HEENT: PERRLA, oral mucosa moist, no sclera icterus or thrush Respiratory system: Clear to auscultation. Respiratory  effort normal. Cardiovascular system: S1 & S2 heard, RRR.   Gastrointestinal system: Abdomen soft, non-tender, nondistended. Normal bowel sound. No organomegaly Extremities: No cyanosis, clubbing or edema Skin: No rashes or ulcers      Data Reviewed: I have personally reviewed following labs and imaging studies  CBC: Recent Labs  Lab 07/12/17 1725 07/13/17 0144 07/14/17 0536  WBC 16.1* 17.8* 8.9  NEUTROABS 14.9*  --   --   HGB 14.9 13.2 11.8*  HCT 45.0 38.9* 35.7*  MCV 93.4 94.2 95.2  PLT 192 163 147*   Basic Metabolic Panel: Recent Labs  Lab 07/12/17 1724 07/12/17 1725 07/13/17 0144 07/14/17 0536  NA  --  139 138 140  K  --  3.5 3.9 3.1*  CL  --  104 105 109  CO2  --  GLUCOSE  --  178* 194* 138*  BUN  --  24* 27* 23*  CREATININE  --  0.87 1.06 0.89  CALCIUM  --  8.8* 8.4* 8.4*  MG 1.2*  --  1.4* 1.7  PHOS  --   --  4.1  --    GFR: Estimated Creatinine Clearance: 41.9 mL/min (by C-G formula based on SCr of 0.89 mg/dL). Liver Function Tests: Recent Labs  Lab 07/12/17 1725 07/13/17 0144  AST 29 26  ALT 24 23  ALKPHOS 86 75  BILITOT 1.3* 1.5*  PROT 5.8* 5.5*  ALBUMIN 2.7* 2.5*   No results for input(s): LIPASE, AMYLASE in the last 168 hours. No results for input(s): AMMONIA in the last 168 hours. Coagulation Profile: No results for input(s): INR, PROTIME in the last 168 hours. Cardiac Enzymes: Recent Labs  Lab 07/12/17 1725 07/12/17 1953 07/13/17 0144 07/13/17 0841  TROPONINI <0.03 <0.03 <0.03 0.03*   BNP (last 3 results) No results for input(s): PROBNP in the last 8760 hours. HbA1C: No results for input(s): HGBA1C in the last 72 hours. CBG: No results for input(s): GLUCAP in the last 168 hours. Lipid Profile: No results for input(s): CHOL, HDL, LDLCALC, TRIG, CHOLHDL, LDLDIRECT in the last 72 hours. Thyroid Function Tests: Recent Labs    07/13/17 0144  TSH 0.570   Anemia Panel: No results for input(s): VITAMINB12, FOLATE,  FERRITIN, TIBC, IRON, RETICCTPCT in the last 72 hours. Urine analysis:    Component Value Date/Time   COLORURINE YELLOW 07/14/2017 0909   APPEARANCEUR HAZY (A) 07/14/2017 0909   LABSPEC 1.023 07/14/2017 0909   PHURINE 5.0 07/14/2017 0909   GLUCOSEU NEGATIVE 07/14/2017 0909   HGBUR MODERATE (A) 07/14/2017 0909   BILIRUBINUR NEGATIVE 07/14/2017 0909   KETONESUR NEGATIVE 07/14/2017 0909   PROTEINUR NEGATIVE 07/14/2017 0909   UROBILINOGEN 0.2 10/23/2014 1120  NITRITE NEGATIVE 07/14/2017 0909   LEUKOCYTESUR MODERATE (A) 07/14/2017 0909   Sepsis Labs: @LABRCNTIP (procalcitonin:4,lacticidven:4) ) Recent Results (from the past 240 hour(s))  MRSA PCR Screening     Status: None   Collection Time: 07/13/17  8:41 AM  Result Value Ref Range Status   MRSA by PCR NEGATIVE NEGATIVE Final    Comment:        The GeneXpert MRSA Assay (FDA approved for NASAL specimens only), is one component of a comprehensive MRSA colonization surveillance program. It is not intended to diagnose MRSA infection nor to guide or monitor treatment for MRSA infections. Performed at [facility], [address], [place], [place] [address]          Radiology Studies: Ct Head Wo Contrast  Result Date: 07/12/2017 CLINICAL DATA:  Altered mental status.  Minimally verbal. EXAM: CT HEAD WITHOUT CONTRAST TECHNIQUE: Contiguous axial images were obtained from the base of the skull through the vertex without intravenous contrast. COMPARISON:  03/11/2017 FINDINGS: Brain: No evidence of acute infarction, hemorrhage, hydrocephalus, extra-axial collection or mass lesion/mass effect. Stable findings of chronic ischemic infarction of the left occipital lobe, and left basal ganglia. Moderate brain parenchymal volume loss and advanced deep white matter microangiopathy. Vascular: Calcific atherosclerotic disease at the skull base. Skull: Normal. Negative for fracture or focal lesion. Sinuses/Orbits: Polypoid  mucosal thickening of the left maxillary sinus. Other: None. IMPRESSION: No acute intracranial abnormality. Stable findings of chronic ischemic infarctions of the left occipital lobe and left basal ganglia. Advanced brain parenchymal atrophy and chronic deep white matter microvascular disease. Electronically Signed   By: Ted Mcalpine M.D.   On: 07/12/2017 17:26   Dg Chest Port 1 View  Result Date: 07/13/2017 CLINICAL DATA:  Pneumonia. EXAM: PORTABLE CHEST 1 VIEW COMPARISON:  07/12/2017 FINDINGS: Enlarged cardiac silhouette. Calcific atherosclerotic disease and tortuosity of the aorta. There is no evidence of lobar airspace consolidation, pleural effusion or pneumothorax. Mild central peribronchial airspace opacities bilaterally. Stable posttraumatic changes of the left humerus with humeral prosthesis. Soft tissues are grossly normal. Residual contrast within the GI tract. IMPRESSION: Mild central peribronchial airspace opacities bilaterally on the background of bronchiectasis. These may represent bronchitic changes. No lobar consolidation. Electronically Signed   By: Ted Mcalpine M.D.   On: 07/13/2017 15:36   Dg Chest Port 1 View  Result Date: 07/12/2017 CLINICAL DATA:  Shortness of breath, vomiting. EXAM: PORTABLE CHEST 1 VIEW COMPARISON:  03/11/2017. FINDINGS: Trachea is midline. Heart size is grossly stable. Thoracic aorta is calcified. Lungs are somewhat low in volume with perihilar airspace opacification, left greater than right. No definite pleural fluid. Left shoulder arthroplasty. IMPRESSION: 1. Perihilar airspace opacification may be due to edema or pneumonia. Aspiration is not excluded. 2.  Aortic atherosclerosis (ICD10-170.0). Electronically Signed   By: Leanna Battles M.D.   On: 07/12/2017 17:16   Dg Swallowing Func-speech Pathology  Result Date: 07/13/2017 Objective Swallowing Evaluation: Type of Study: MBS-Modified Barium Swallow Study  Patient Details Name: Darryl Carroll  MRN: [id number] Date of Birth: 1927-06-14 Today's Date: 07/13/2017 Time: SLP Start Time (ACUTE ONLY): 1430 -SLP Stop Time (ACUTE ONLY): 1459 SLP Time Calculation (min) (ACUTE ONLY): 29 min Past Medical History: Past Medical History: Diagnosis Date . CAD (coronary artery disease)   s/p multiple PCIs in all 3 vessels with DES. Last PCI 2005 with overlapping Cypher stents in CFX bifurcation lesion . DM (diabetes mellitus), type 2 (HCC)  . Glaucoma  . Gout  . HTN (hypertension)  not under good control . Hyperlipidemia  . Macular degeneration  . Obesity  . Osteoporosis  . Paroxysmal atrial fibrillation (HCC)  . Peptic ulcer disease  . Sleep apnea  . Spinal stenosis  Past Surgical History: Past Surgical History: Procedure Laterality Date . Bilateral carpal tunnel surgery   . CARDIAC CATHETERIZATION    x4-5, PCIs . Thyroglossal duct cyst removed in Jan or Feb 2005   . TURP VAPORIZATION   HPI: 84 yo male admitted with aspiration pna - nausea and vomiting with SOB. PMH of CVA, back pain, Parkinson's disease, dementia, vertebral fx, A fib, DM, CAD, gout. Pt is from a SNF. MBS ordered due to concerns pt may be aspirating. CXR showed perihilar opacities, concerning for possible aspiration.   Pt has had a prior MBS many years ago but results are not available.   No data recorded Assessment / Plan / Recommendation CHL IP CLINICAL IMPRESSIONS 07/13/2017 Clinical Impression Difficult view due to pt's positioning - therefore at times larynx, trachea was not visualized.  Pt presents with moderate oropharyngeal and cervical esophageal dysphagia with sensorimotor deficits.  Also suspect esophageal component to his dysphagia.  Patient with decreased bolus cohesion resulting in premature spillage into pharynx and oral residuals which pt will swallow - albeit can be significantly delayed.  Trace laryngeal penetration and trace aspiration of secretions mixed with barium noted due to decreased timing of laryngeal closure discoordination  difficulties.  Pt did not sense trace penetration/aspiration.  Residuals in pharynx mixed with pharyngeal secretions and did not fully clear during study.  Chin tuck posture helpful to prevent penetration but uncertain if will consistently help.   Upon esophageal sweep, pt appeared with stasis - without awareness.  Barium tablet then given with pudding - appeared to lodge at aortic arch.  Liquids (thin barium and water) given to aid transiting resulted in backflow to proximal cervical esophagus without pt sensation.  Suspect this issue is a large contributing factor to pt's possible aspiration.  Of note, pt is kyphotic this likely further compromises his esophageal clearance.  Recommend dys3/ground meats/thin with strict precautions.   SLP Visit Diagnosis Dysphagia, oropharyngeal phase (R13.12);Dysphagia, pharyngoesophageal phase (R13.14) Attention and concentration deficit following -- Frontal lobe and executive function deficit following -- Impact on safety and function Moderate aspiration risk   CHL IP TREATMENT RECOMMENDATION 07/13/2017 Treatment Recommendations Therapy as outlined in treatment plan below   Prognosis 07/13/2017 Prognosis for Safe Diet Advancement Fair Barriers to Reach Goals Other (Comment) Barriers/Prognosis Comment -- CHL IP DIET RECOMMENDATION 07/13/2017 SLP Diet Recommendations Dysphagia 3 (Mech soft) solids;Thin liquid Liquid Administration via Straw Medication Administration Whole meds with puree Compensations Slow rate;Small sips/bites;Follow solids with liquid;Other (Comment);Chin tuck Postural Changes Seated upright at 90 degrees;Remain semi-upright after after feeds/meals (Comment)   CHL IP OTHER RECOMMENDATIONS 07/13/2017 Recommended Consults Consider esophageal assessment Oral Care Recommendations Oral care QID Other Recommendations Have oral suction available   CHL IP FOLLOW UP RECOMMENDATIONS 07/13/2017 Follow up Recommendations Skilled Nursing facility   [facility] IP FREQUENCY AND DURATION  07/13/2017 Speech Therapy Frequency (ACUTE ONLY) min 2x/week Treatment Duration 2 weeks      CHL IP ORAL PHASE 07/13/2017 Oral Phase Impaired Oral - Pudding Teaspoon -- Oral - Pudding Cup -- Oral - Honey Teaspoon -- Oral - Honey Cup -- Oral - Nectar Teaspoon Decreased bolus cohesion;Delayed oral transit;Piecemeal swallowing;Premature spillage Oral - Nectar Cup Decreased bolus cohesion;Reduced posterior propulsion;Piecemeal swallowing;Premature spillage Oral - Nectar Straw Decreased bolus cohesion;Reduced posterior propulsion;Delayed oral transit;Piecemeal swallowing;Premature spillage  Oral - Thin Teaspoon Decreased bolus cohesion;Reduced posterior propulsion;Lingual/palatal residue;Piecemeal swallowing;Premature spillage Oral - Thin Cup -- Oral - Thin Straw Decreased bolus cohesion;Weak lingual manipulation;Reduced posterior propulsion;Lingual/palatal residue;Piecemeal swallowing;Premature spillage Oral - Puree Decreased bolus cohesion;Weak lingual manipulation;Reduced posterior propulsion;Lingual/palatal residue;Piecemeal swallowing;Premature spillage Oral - Mech Soft Decreased bolus cohesion;Premature spillage;Weak lingual manipulation;Reduced posterior propulsion;Lingual/palatal residue Oral - Regular -- Oral - Multi-Consistency -- Oral - Pill Decreased bolus cohesion;Premature spillage;Weak lingual manipulation;Reduced posterior propulsion;Lingual/palatal residue Oral Phase - Comment --  CHL IP PHARYNGEAL PHASE 07/13/2017 Pharyngeal Phase Impaired Pharyngeal- Pudding Teaspoon -- Pharyngeal -- Pharyngeal- Pudding Cup -- Pharyngeal -- Pharyngeal- Honey Teaspoon -- Pharyngeal -- Pharyngeal- Honey Cup -- Pharyngeal -- Pharyngeal- Nectar Teaspoon Penetration/Aspiration before swallow;Reduced epiglottic inversion;Reduced laryngeal elevation;Reduced airway/laryngeal closure;Reduced tongue base retraction Pharyngeal Material enters airway, remains ABOVE vocal cords then ejected out Pharyngeal- Nectar Cup  Penetration/Aspiration before swallow;Reduced epiglottic inversion;Reduced laryngeal elevation;Reduced airway/laryngeal closure;Reduced tongue base retraction;Pharyngeal residue - valleculae Pharyngeal Material enters airway, remains ABOVE vocal cords and not ejected out Pharyngeal- Nectar Straw Reduced laryngeal elevation;Reduced airway/laryngeal closure;Reduced tongue base retraction;Penetration/Aspiration during swallow Pharyngeal Material enters airway, remains ABOVE vocal cords and not ejected out Pharyngeal- Thin Teaspoon Reduced laryngeal elevation;Reduced anterior laryngeal mobility;Reduced airway/laryngeal closure;Reduced tongue base retraction;Penetration/Aspiration before swallow Pharyngeal Material enters airway, remains ABOVE vocal cords then ejected out Pharyngeal- Thin Cup -- Pharyngeal -- Pharyngeal- Thin Straw Reduced epiglottic inversion;Reduced laryngeal elevation;Reduced airway/laryngeal closure;Reduced tongue base retraction;Penetration/Aspiration before swallow Pharyngeal Material enters airway, remains ABOVE vocal cords and not ejected out Pharyngeal- Puree Delayed swallow initiation-vallecula;Reduced tongue base retraction;Pharyngeal residue - valleculae;Pharyngeal residue - pyriform;Reduced epiglottic inversion Pharyngeal -- Pharyngeal- Mechanical Soft Pharyngeal residue - valleculae;Pharyngeal residue - pyriform;Reduced epiglottic inversion;Reduced tongue base retraction Pharyngeal -- Pharyngeal- Regular -- Pharyngeal -- Pharyngeal- Multi-consistency -- Pharyngeal -- Pharyngeal- Pill Delayed swallow initiation-vallecula Pharyngeal -- Pharyngeal Comment --  CHL IP CERVICAL ESOPHAGEAL PHASE 07/13/2017 Cervical Esophageal Phase Impaired Pudding Teaspoon -- Pudding Cup -- Honey Teaspoon -- Honey Cup -- Nectar Teaspoon Reduced cricopharyngeal relaxation Nectar Cup -- Nectar Straw Reduced cricopharyngeal relaxation Thin Teaspoon Reduced cricopharyngeal relaxation Thin Cup -- Thin Straw Reduced  cricopharyngeal relaxation Puree Reduced cricopharyngeal relaxation Mechanical Soft Reduced cricopharyngeal relaxation Regular -- Multi-consistency -- Pill Reduced cricopharyngeal relaxation Cervical Esophageal Comment -- No flowsheet data found. Donavan Burnet, MS [facility] SLP [phone]                 Scheduled Meds: . atenolol  100 mg Oral Daily  . carbidopa-levodopa  1 tablet Oral TID  . diltiazem  120 mg Oral Daily  . feeding supplement (ENSURE ENLIVE)  237 mL Oral BID BM  . mouth rinse  15 mL Mouth Rinse BID  . Rivaroxaban  15 mg Oral QHS  . sertraline  100 mg Oral Daily   Continuous Infusions: . piperacillin-tazobactam (ZOSYN)  IV Stopped (07/14/17 1226)     LOS: 2 days    Time spent in minutes: 35    Calvert Cantor, MD [facility] Pager: [email or web address] Password [facility] 07/14/2017, 3:06 PM Dr. Milus Banister

## 2017-07-15 DIAGNOSIS — R627 Adult failure to thrive: Secondary | ICD-10-CM

## 2017-07-15 DIAGNOSIS — R3989 Other symptoms and signs involving the genitourinary system: Secondary | ICD-10-CM

## 2017-07-15 LAB — CBC WITH DIFFERENTIAL/PLATELET
BASOS ABS: 0 10*3/uL (ref 0.0–0.1)
BASOS PCT: 0 %
Eosinophils Absolute: 0.4 10*3/uL (ref 0.0–0.7)
Eosinophils Relative: 3 %
HEMATOCRIT: 39.5 % (ref 39.0–52.0)
HEMOGLOBIN: 12.8 g/dL — AB (ref 13.0–17.0)
LYMPHS PCT: 13 %
Lymphs Abs: 1.4 10*3/uL (ref 0.7–4.0)
MCH: 30.6 pg (ref 26.0–34.0)
MCHC: 32.4 g/dL (ref 30.0–36.0)
MCV: 94.5 fL (ref 78.0–100.0)
Monocytes Absolute: 1 10*3/uL (ref 0.1–1.0)
Monocytes Relative: 10 %
NEUTROS PCT: 74 %
Neutro Abs: 7.9 10*3/uL — ABNORMAL HIGH (ref 1.7–7.7)
Platelets: 179 10*3/uL (ref 150–400)
RBC: 4.18 MIL/uL — AB (ref 4.22–5.81)
RDW: 16.3 % — ABNORMAL HIGH (ref 11.5–15.5)
WBC: 10.6 10*3/uL — ABNORMAL HIGH (ref 4.0–10.5)

## 2017-07-15 LAB — COMPREHENSIVE METABOLIC PANEL
ALBUMIN: 2.3 g/dL — AB (ref 3.5–5.0)
ALK PHOS: 72 U/L (ref 38–126)
ALT: 8 U/L — AB (ref 17–63)
AST: 45 U/L — AB (ref 15–41)
Anion gap: 9 (ref 5–15)
BUN: 14 mg/dL (ref 6–20)
CALCIUM: 8.6 mg/dL — AB (ref 8.9–10.3)
CO2: 22 mmol/L (ref 22–32)
CREATININE: 0.61 mg/dL (ref 0.61–1.24)
Chloride: 108 mmol/L (ref 101–111)
GFR calc Af Amer: >60 mL/min (ref >60)
GFR calc non Af Amer: >60 mL/min (ref >60)
GLUCOSE: 191 mg/dL — AB (ref 65–99)
Potassium: 3.5 mmol/L (ref 3.5–5.1)
Sodium: 139 mmol/L (ref 135–145)
TOTAL PROTEIN: 5.4 g/dL — AB (ref 6.5–8.1)
Total Bilirubin: 0.6 mg/dL (ref 0.3–1.2)

## 2017-07-15 LAB — PHOSPHORUS: Phosphorus: 2.4 mg/dL — ABNORMAL LOW (ref 2.5–4.6)

## 2017-07-15 LAB — MAGNESIUM: Magnesium: 1.4 mg/dL — ABNORMAL LOW (ref 1.7–2.4)

## 2017-07-15 MED ORDER — POTASSIUM CHLORIDE CRYS ER 20 MEQ PO TBCR
40.0000 meq | EXTENDED_RELEASE_TABLET | Freq: Two times a day (BID) | ORAL | Status: DC
  Administered 2017-07-15 (×2): 40 meq via ORAL
  Filled 2017-07-15 (×2): qty 2

## 2017-07-15 NOTE — Plan of Care (Signed)
  Problem: Safety: Goal: Ability to remain free from injury will improve Outcome: Progressing   

## 2017-07-15 NOTE — Care Management Important Message (Signed)
Important Message  Patient Details  Name: Darryl Carroll MRN: [id number] Date of Birth: 1927/08/10   Medicare Important Message Given:  Yes    [name] 07/15/2017, 12:10 PMImportant Message  Patient Details  Name: Darryl Carroll MRN: [id number] Date of Birth: 24-May-1927   Medicare Important Message Given:  Yes    [name] 07/15/2017, 12:10 PM

## 2017-07-15 NOTE — Progress Notes (Signed)
PT Cancellation Note  Patient Details Name: Darryl Carroll MRN: [id number] DOB: August 14, 1927   Cancelled Treatment:    Reason Eval/Treat Not Completed: Other (comment)See PT note from 07/13/17. Patient is long term resident of SNF and unable to participate in therapy.   Rada Hay 07/15/2017, 8:01 AM Blanchard Kelch PT [phone]

## 2017-07-15 NOTE — NC FL2 (Signed)
[place] MEDICAID FL2 LEVEL OF CARE SCREENING TOOL     IDENTIFICATION  Patient Name: Darryl Carroll Birthdate: [birth year] Sex: male Admission Date (Current Location): 07/12/2017  [facility] and [place] Number:  Producer, television/film/video and Address:  [facility],  [address] [place], [place] [address]      Provider Number: [id number]  Attending Physician Name and Address:  Merlene Laughter, DO  Relative Name and Phone Number:       Current Level of Care: Hospital Recommended Level of Care: Skilled Nursing Facility Prior Approval Number:    Date Approved/Denied:   PASRR Number:    Discharge Plan: SNF    Current Diagnoses: Patient Active Problem List   Diagnosis Date Noted  . Palliative care by specialist   . DNR (do not resuscitate)   . Adult failure to thrive   . Hypokalemia 07/12/2017  . Aspiration pneumonia (HCC) 07/12/2017  . Nonsustained ventricular tachycardia (HCC) 07/12/2017  . PD (Parkinson's disease) (HCC)   . Dementia due to Parkinson's disease without behavioral disturbance (HCC)   . Stroke (cerebrum) (HCC) 03/11/2017  . Prosthetic joint infection (HCC) 12/06/2011  . Graceyn Fodor term (current) use of anticoagulants 06/06/2010  . PERIPHERAL VASCULAR INSUFFICIENCY,  LEGS, BILATERAL 08/15/2009  . DIABETES MELLITUS, TYPE II 08/14/2008  . Hyperlipidemia 08/14/2008  . GOUT 08/14/2008  . OBESITY 08/14/2008  . Macular degeneration (senile) of retina 08/14/2008  . GLAUCOMA 08/14/2008  . Hypertension 08/14/2008  . Coronary atherosclerosis 08/14/2008  . FIBRILLATION, ATRIAL 08/14/2008  . Peptic ulcer 08/14/2008  . SPINAL STENOSIS 08/14/2008  . OSTEOPOROSIS 08/14/2008  . Sleep apnea 08/14/2008    Orientation RESPIRATION BLADDER Height & Weight     Self  Normal Incontinent Weight: 113 lb 12.1 oz (51.6 kg) Height:   (167.6 cm)  BEHAVIORAL SYMPTOMS/MOOD NEUROLOGICAL BOWEL NUTRITION STATUS      Incontinent Diet(see dc summary)  AMBULATORY STATUS  COMMUNICATION OF NEEDS Skin   Limited Assist Verbally Other (Comment)(MASD Buttocks,Scrotum; Mid,Left)                       Personal Care Assistance Level of Assistance  Bathing, Feeding, Dressing Bathing Assistance: Maximum assistance Feeding assistance: Limited assistance Dressing Assistance: Maximum assistance     Functional Limitations Info  Sight, Hearing, Speech Sight Info: Impaired Hearing Info: Adequate Speech Info: Adequate    SPECIAL CARE FACTORS FREQUENCY                       Contractures      Additional Factors Info  Code Status, Allergies, Psychotropic Code Status Info: DNR Allergies Info: Contrast Media Iodinated Diagnostic Agents; Bee Stings; Zolpidem Psychotropic Info: Zoloft         Current Medications (07/15/2017):  This is the current hospital active medication list Current Facility-Administered Medications  Medication Dose Route Frequency Provider Last Rate Last Dose  . acetaminophen (TYLENOL) tablet 650 mg  650 mg Oral Q6H PRN Therisa Doyne, MD   650 mg at 07/13/17 2127   Or  . acetaminophen (TYLENOL) suppository 650 mg  650 mg Rectal Q6H PRN Doutova, Anastassia, MD      . albuterol (PROVENTIL) (2.5 MG/3ML) 0.083% nebulizer solution 2.5 mg  2.5 mg Nebulization Q2H PRN Doutova, Anastassia, MD      . alum & mag hydroxide-simeth (MAALOX/MYLANTA) 200-200-20 MG/5ML suspension 30 mL  30 mL Oral Q4H PRN Calvert Cantor, MD   30 mL at 07/14/17 2122  . atenolol (  TENORMIN) tablet 100 mg  100 mg Oral Daily Calvert Cantor, MD   100 mg at 07/14/17 1021  . carbidopa-levodopa (SINEMET IR) 25-100 MG per tablet immediate release 1 tablet  1 tablet Oral TID Therisa Doyne, MD   1 tablet at 07/14/17 2122  . diltiazem (CARDIZEM CD) 24 hr capsule 120 mg  120 mg Oral Daily Calvert Cantor, MD   120 mg at 07/14/17 1021  . feeding supplement (ENSURE ENLIVE) (ENSURE ENLIVE) liquid 237 mL  237 mL Oral BID BM Rizwan, Saima, MD   237 mL at 07/14/17 1424  .  MEDLINE mouth rinse  15 mL Mouth Rinse BID Therisa Doyne, MD   15 mL at 07/14/17 2123  . ondansetron (ZOFRAN) tablet 4 mg  4 mg Oral Q6H PRN Doutova, Anastassia, MD       Or  . ondansetron (ZOFRAN) injection 4 mg  4 mg Intravenous Q6H PRN Doutova, Anastassia, MD      . piperacillin-tazobactam (ZOSYN) IVPB 3.375 g  3.375 g Intravenous Q8H Lorenza Evangelist, RPH 12.5 mL/hr at 07/15/17 0823 3.375 g at 07/15/17 0823  . potassium chloride SA (K-DUR,KLOR-CON) CR tablet 40 mEq  40 mEq Oral BID Sheikh, Omair Latif, DO      . Rivaroxaban (XARELTO) tablet 15 mg  15 mg Oral QHS Therisa Doyne, MD   15 mg at 07/14/17 2122  . sertraline (ZOLOFT) tablet 100 mg  100 mg Oral Daily Doutova, Anastassia, MD   100 mg at 07/14/17 1020  . traMADol (ULTRAM) tablet 50 mg  50 mg Oral Q8H PRN Calvert Cantor, MD   50 mg at 07/15/17 0226     Discharge Medications: Please see discharge summary for a list of discharge medications.  Relevant Imaging Results:  Relevant Lab Results:   Additional Information SS#: [id number]  Antionette Poles, LCSW

## 2017-07-15 NOTE — Progress Notes (Signed)
   07/15/17 1627  Clinical Encounter Type  Visited With Patient and family together  Visit Type Initial;Spiritual support  Referral From Nurse  Consult/Referral To Chaplain  Spiritual Encounters  Spiritual Needs Prayer   Responded to a [facility] for Palliative Care.  Patient was in the bed with his son at bedside.  Son indicated his mom is still in their home at the [facility].  Son shared that his dad served in the [place] and a time in his life suffered from PTSD.  States his father does not remember much of that anymore.  Prayed with the patient and his son.  Son states his father will not be able to go back home, but to [place].  Provided empathic listening to the son who really wanted to share a lot about his dad's life.  Will follow and support as needed,. Chaplain Agustin Cree

## 2017-07-15 NOTE — Progress Notes (Signed)
PROGRESS NOTE    Darryl Carroll  ZOX:[id number] DOB: [birth year] DOA: 07/12/2017 PCP: Merlene Laughter, MD   Brief Narrative:  Darryl Carroll is a 84 y.o.malewith medical history significant of CAD, DM 2, gout, HTN, HLD, paroxysmal atrial fibrillation, OSA, chronic back pain, peptic ulcer disease, parkinson disease with dementia who presents from [facility] for lethargy after vomiting up lunch.   The Admission CXR showed Perihilar airspace opacification may be due to edema or pneumonia. Aspiration is not excluded.  It was suspected that he aspirated and he was started on Vanc and Zosyn for HCAP, however repeat CXR showed no consolidation. Likely the patient has a UTI that was present on Admission and is currently being treated. Anticipate D/C back to SNF in AM if stable.   Assessment & Plan:   Active Problems:   Hyperlipidemia   Hypertension   Coronary atherosclerosis   FIBRILLATION, ATRIAL   Sleep apnea   Long term (current) use of anticoagulants   PD (Parkinson's disease) (HCC)   Dementia due to Parkinson's disease without behavioral disturbance (HCC)   Hypokalemia   Aspiration pneumonia (HCC)   Nonsustained ventricular tachycardia (HCC)   Palliative care by specialist   DNR (do not resuscitate)   Adult failure to thrive  Acute metabolic encephalopathy in the setting of Dementia and suspected UTI; CAP ruled out   -Repeat CXR shows no consolidation and therefore, he does not have a pneumonia -He does have a + UA and I have ordered a culture on 5/6 keeping in mind that he is already on antibiotics -Lethargy has improved -Repeat Culture from 5/6 is pending and unclear if has been sent so will reorder but repeat UA shows WBC dropped from large to moderate, ongoing microscopic hematuria and rare bacteria - will cont to treat as a UTI- reasssed for hematuria once UTI resolved (on Xarelto) -MRSA PCR neg- stopped Vancomycin -Will continue to Treat with IV Zosyn and De-escalate  accordingly   Dysphagia -SLP notes high risk for aspiration but has recommended Dysphagia 3 Diet with clear liquids and Is recommending an esophageal assessment- No further work up- family wants him to eat regardless and asked for Regular Diet; Patient is currently on Regular diet since yesterday  -Palliative Care involved and they will follow at SNF  Chronic Back Pain -Lumbar MRI showed Age-indeterminate compression fractures at T11, L1 and L2. Minimal bone marrow edema. These are probably subacute.  Retropulsion of the posterior aspects of the T11 and L1 vertebrae without spinal canal stenosis. Moderate right L2-3 and bilateral L4-5 neural foraminal stenosis.. Grade 1 L4-5 anterolisthesis due to severe facet arthrosis. -C/w Acetaminophen 650 mg po/RC for Mild Pain and Tramadol 50 mg po q8hprn Moderate Pain   Vomiting -Improved. Likely in the setting of UTI -Diet now Regular Diet per Family request knowing risks of aspiration  PD (Parkinson's disease) Dementia due to Parkinson's disease with  behavioral disturbance -Continue with Carbidopa-Levodopa 25-100 mg p.o. 3 times daily as well as Sertraline 100 mg p.o. Daily -Delirium precautions   Atrial Fibrillation -Continue with Atenolol 100 mg p.o. Daily and Diltiazem 120 mg p.o. daily for rate control -Continue with Rivaroxaban 15 mg p.o. nightly for anticoagulation  DM2 -Continue to hold Metformin at this time\ -Blood Sugars on BMP's have been ranging from 138-194 -Continue to Monitor and if Necessary Add SSI   Hypokalemia -Patient's potassium level was 3.1 yesterday.  Repeat level not drawn today but is ordered and pending -Continue to monitor and replete as necessary -  Repeat CMP in a.m.  Thrombocytopenia -Patient's Platelet count is steadily trending downwards and went from 192 and is 147 now -Continue to monitor for signs and symptoms of bleeding as patient is anticoagulated with Rivaroxaban -Repeat CBC in AM  DVT  prophylaxis: Anticoagulated with Rivaroxaban Code Status: DO NOT RESUSCITATE Family Communication: No family present at bedside  Disposition Plan: Anticipate discharge back to SNF in a.m.  Consultants:   Palliative Care Medicine    Procedures: None   Antimicrobials:  Anti-infectives (From admission, onward)   Start     Dose/Rate Route Frequency Ordered Stop   07/13/17 0800  vancomycin (VANCOCIN) IVPB 750 mg/150 ml premix  Status:  Discontinued     750 mg 150 mL/hr over 60 Minutes Intravenous Every 24 hours 07/13/17 0318 07/14/17 0854   07/12/17 2359  piperacillin-tazobactam (ZOSYN) IVPB 3.375 g     3.375 g 12.5 mL/hr over 240 Minutes Intravenous Every 8 hours 07/12/17 2210     07/12/17 1745  piperacillin-tazobactam (ZOSYN) IVPB 3.375 g     3.375 g 100 mL/hr over 30 Minutes Intravenous  Once 07/12/17 1740 07/12/17 1852   07/12/17 1745  vancomycin (VANCOCIN) IVPB 1000 mg/200 mL premix     1,000 mg 200 mL/hr over 60 Minutes Intravenous  Once 07/12/17 1740 07/12/17 1926     Subjective: Seen and examined and is pleasantly confused   Demented.  New that he was in the hospital however did not know which hospital he was in.  No chest pain, shortness breath, nausea, vomiting. Nursing had placed safety mittens on him as he was combative earlier.  No other concerns or complaints at this time.   Objective: Vitals:   07/14/17 2023 07/15/17 0422 07/15/17 0505 07/15/17 1500  BP: 124/83 (!) 173/111 (!) 154/96 (!) 146/99  Pulse: 100 90 76 68  Resp: Temp: 99 F (37.2 C) 97.9 F (36.6 C)  98.3 F (36.8 C)  TempSrc: Oral Oral  Oral  SpO2: 95% 97%  98%  Weight:      Height:        Intake/Output Summary (Last 24 hours) at 07/15/2017 1745 Last data filed at 07/15/2017 1659 Gross per 24 hour  Intake 250 ml  Output 650 ml  Net -400 ml   Filed Weights   07/13/17 0251  Weight: 51.6 kg (113 lb 12.1 oz)   Examination: Physical Exam:  Constitutional: Thin Elderly Caucasian  Male in NAD and appears calm and comfortable Eyes: Lids and conjunctivae normal, sclerae anicteric  ENMT: External Ears, Nose appear normal. Grossly normal hearing. Mucous membranes are moist Neck: Appears normal, supple, no cervical masses, normal ROM, no appreciable thyromegaly; no JVD Respiratory: Diminisehd to auscultation bilaterally, no wheezing, rales, rhonchi or crackles. Normal respiratory effort and patient is not tachypenic. No accessory muscle use.  Cardiovascular: Irregularly Irregular, no murmurs / rubs / gallops. S1 and S2 auscultated.  Abdomen: Soft, non-tender, non-distended. No masses palpated. No appreciable hepatosplenomegaly. Bowel sounds positive x4.  GU: Deferred. Musculoskeletal: No Joint deformities noted. Hands are in mittens.  Skin: No rashes, lesions, ulcers on a limited skin evaluation. No induration; Warm and dry.  Neurologic: CN 2-12 grossly intact with no focal deficits. Romberg and sign cerebellar reflexes not assessed.  Psychiatric: Impaired judgment and insight. Alert and oriented x1 and is pleasantly demented. Normal mood and appropriate affect.   Data Reviewed: I have personally reviewed following labs and imaging studies  CBC: Recent Labs  Lab 07/12/17 1725 07/13/17 0144  07/14/17 0536  WBC 16.1* 17.8* 8.9  NEUTROABS 14.9*  --   --   HGB 14.9 13.2 11.8*  HCT 45.0 38.9* 35.7*  MCV 93.4 94.2 95.2  PLT 192 163 147*   Basic Metabolic Panel: Recent Labs  Lab 07/12/17 1724 07/12/17 1725 07/13/17 0144 07/14/17 0536  NA  --  139 138 140  K  --  3.5 3.9 3.1*  CL  --  104 105 109  CO2  --  GLUCOSE  --  178* 194* 138*  BUN  --  24* 27* 23*  CREATININE  --  0.87 1.06 0.89  CALCIUM  --  8.8* 8.4* 8.4*  MG 1.2*  --  1.4* 1.7  PHOS  --   --  4.1  --    GFR: Estimated Creatinine Clearance: 41.9 mL/min (by C-G formula based on SCr of 0.89 mg/dL). Liver Function Tests: Recent Labs  Lab 07/12/17 1725 07/13/17 0144  AST 29 26  ALT 24 23   ALKPHOS 86 75  BILITOT 1.3* 1.5*  PROT 5.8* 5.5*  ALBUMIN 2.7* 2.5*   No results for input(s): LIPASE, AMYLASE in the last 168 hours. No results for input(s): AMMONIA in the last 168 hours. Coagulation Profile: No results for input(s): INR, PROTIME in the last 168 hours. Cardiac Enzymes: Recent Labs  Lab 07/12/17 1725 07/12/17 1953 07/13/17 0144 07/13/17 0841  TROPONINI <0.03 <0.03 <0.03 0.03*   BNP (last 3 results) No results for input(s): PROBNP in the last 8760 hours. HbA1C: No results for input(s): HGBA1C in the last 72 hours. CBG: No results for input(s): GLUCAP in the last 168 hours. Lipid Profile: No results for input(s): CHOL, HDL, LDLCALC, TRIG, CHOLHDL, LDLDIRECT in the last 72 hours. Thyroid Function Tests: Recent Labs    07/13/17 0144  TSH 0.570   Anemia Panel: No results for input(s): VITAMINB12, FOLATE, FERRITIN, TIBC, IRON, RETICCTPCT in the last 72 hours. Sepsis Labs: No results for input(s): PROCALCITON, LATICACIDVEN in the last 168 hours.  Recent Results (from the past 240 hour(s))  MRSA PCR Screening     Status: None   Collection Time: 07/13/17  8:41 AM  Result Value Ref Range Status   MRSA by PCR NEGATIVE NEGATIVE Final    Comment:        The GeneXpert MRSA Assay (FDA approved for NASAL specimens only), is one component of a comprehensive MRSA colonization surveillance program. It is not intended to diagnose MRSA infection nor to guide or monitor treatment for MRSA infections. Performed at [facility], [address], [place], [place] [address]     Radiology Studies: Mr Lumbar Spine Limited Wo Contrast  Result Date: 07/14/2017 CLINICAL DATA:  Suspected vertebral fracture. Steroid therapy for 3 months. EXAM: MRI LUMBAR SPINE WITHOUT CONTRAST TECHNIQUE: Multiplanar, multisequence MR imaging of the lumbar spine was performed. No intravenous contrast was administered. COMPARISON:  None. FINDINGS: Segmentation: Normal. The  lowest disc space is considered to be L5-S1. Alignment:  Grade 1 L4-L5 anterolisthesis. Vertebrae: Severe compression deformities at T11 and L1. Moderate compression deformity at L2. There is mild bone marrow edema within the right side of the T11 vertebral body and centrally within the L1 body. At T11, there is retropulsion causing mass effect on the posterior longitudinal ligament and extending 7 mm into the spinal canal. This results in no greater than mild spinal canal narrowing. There is edema within the disc spaces at T11-12 and T12-L1. Conus medullaris and cauda equina: The  conus medullaris terminates at the L1 level. The cauda equina and conus medullaris are both normal. Paraspinal and other soft tissues: Fatty atrophy of the posterior paraspinous musculature. Disc levels: T9-T12 is imaged only in the sagittal plane. As above, there are in retropulsion of the posterior wall of T11 that mildly narrows the spinal canal. Spinal canal is otherwise widely patent. The T12-S1 disc levels are described individually below. T12-L1: Disc height is preserved. No spinal canal or neural foraminal stenosis. There is minimal retropulsion of the posterosuperior corner of L1, measuring 3 mm. L1-L2: Disc desiccation and mild height loss.  No stenosis. L2-L3: Disc desiccation with mild height loss and small bilateral foraminal protrusions. No spinal canal stenosis. Moderate right and mild left neural foraminal stenosis. L3-L4: Mild disc bulge without stenosis. L4-L5: Grade 1 anterolisthesis due to severe bilateral facet hypertrophy. No spinal canal stenosis. Moderate bilateral neural foraminal stenosis. L5-S1: Normal. IMPRESSION: 1. Age-indeterminate compression fractures at T11, L1 and L2. Minimal bone marrow edema. These are probably subacute. 2. Retropulsion of the posterior aspects of the T11 and L1 vertebrae without spinal canal stenosis. 3. Moderate right L2-3 and bilateral L4-5 neural foraminal stenosis. 4. Grade 1 L4-5  anterolisthesis due to severe facet arthrosis. Electronically Signed   By: Deatra Robinson M.D.   On: 07/14/2017 18:27   Scheduled Meds: . atenolol  100 mg Oral Daily  . carbidopa-levodopa  1 tablet Oral TID  . diltiazem  120 mg Oral Daily  . feeding supplement (ENSURE ENLIVE)  237 mL Oral BID BM  . mouth rinse  15 mL Mouth Rinse BID  . potassium chloride  40 mEq Oral BID  . Rivaroxaban  15 mg Oral QHS  . sertraline  100 mg Oral Daily   Continuous Infusions: . piperacillin-tazobactam (ZOSYN)  IV 3.375 g (07/15/17 1706)    LOS: 3 days   Merlene Laughter, DO [facility] Pager [phone]  If 7PM-7AM, please contact night-coverage [email or web address] Password [email or web address] 07/15/2017, 5:45 PM

## 2017-07-15 NOTE — Progress Notes (Signed)
Patient ID: Darryl Carroll, male   DOB: 08/02/1927, 84 y.o.   MRN: [id number]  This NP visited patient at the bedside as a follow up for palliative medicine needs and emotional support.  Patient is  confused and agitated, son at bedside.  Continued conversation regarding diagnosis, prognosis, GOC, EOL wishes, anticipatory care needs.  Detailed the natural trajectory and expectations of ES Parkinson disease.  Son verbalizes an understanding of the seriousness of the situation but remains hopeful for improvement and return to baseline.  Plan is back to his SNF/likely tomorrow  and I recommend palliative services to follow on discharge.   We discussed hospice benefit.  Discussed with son the importance of continued conversation with his family and the  medical providers regarding overall plan of care and treatment options,  ensuring decisions are within the context of the patients values and GOCs.  Questions and concerns addressed   Discussed with Dr Marland Mcalpine    Total time spent on the unit was 35 minutes  Greater than 50% of the time was spent in counseling and coordination of care  Lorinda Creed NP  Palliative Medicine Team Team Phone # [phone] Pager [phone]

## 2017-07-15 NOTE — Progress Notes (Signed)
SLP Cancellation Note  Patient Details Name: Darryl Carroll MRN: [id number] DOB: March 30, 1927   Cancelled treatment:       Reason Eval/Treat Not Completed: Other (comment);Patient declined, no reason specified(pt not willing to accept po at this time)  Donavan Burnet, MS [facility] SLP [phone]

## 2017-07-15 NOTE — Clinical Social Work Note (Signed)
Clinical Social Work Assessment  Patient Details  Name: Darryl Carroll MRN: [id number] Date of Birth: Jun 25, 1927  Date of referral:  07/15/17               Reason for consult:  Discharge Planning                Permission sought to share information with:    Permission granted to share information::     Name::        Agency::     Relationship::     Contact Information:     Housing/Transportation Living arrangements for the past 2 months:  Skilled Nursing Facility([facility]) Source of Information:  Spouse(Darryl Carroll [phone]) Patient Interpreter Needed:  None Criminal Activity/Legal Involvement Pertinent to Current Situation/Hospitalization:  No - Comment as needed Significant Relationships:  Spouse Lives with:  Facility Resident Do you feel safe going back to the place where you live?    Need for family participation in patient care:  Yes (Comment)  Care giving concerns:  Patient is a Darryl Carroll term care resident at [facility]. Patient's wife reported no care giving concerns.   Social Worker assessment / plan:  CSW spoke with patient's wife via telephone regarding patient's discharge planning, patient currently oriented to self and unable to participate in assessment. Patient's wife verbalized plan for patient to return to [facility] when medically stable.  CSW spoke with [facility] SNF admissions staff Tresa Endo, staff confirmed patient's ability to return.  CSW will complete FL2.   CSW will continue to follow and assist with patient's discharge planning.  Employment status:  Retired Database administrator PT Recommendations:  Not assessed at this time Information / Referral to community resources:  Skilled Nursing Facility(Patient is a Darryl Carroll term care resident at [facility])  Patient/Family's Response to care:  Patient's wife verbalized plan for patient to return to [facility] SNF.   Patient/Family's Understanding of and Emotional  Response to Diagnosis, Current Treatment, and Prognosis:  CSW spoke with patient's wife over the phone briefly. Patient's wife verbalized plan for patient to dc back to SNF for Darryl Carroll term care.   Emotional Assessment Appearance:    Attitude/Demeanor/Rapport:  Unable to Assess Affect (typically observed):  Unable to Assess Orientation:  Oriented to Self Alcohol / Substance use:  Not Applicable Psych involvement (Current and /or in the community):  No (Comment)  Discharge Needs  Concerns to be addressed:  Care Coordination Readmission within the last 30 days:  No Current discharge risk:  None Barriers to Discharge:  Continued Medical Work up   [employer], LCSW 07/15/2017, 9:30 AM

## 2017-07-16 ENCOUNTER — Inpatient Hospital Stay (HOSPITAL_COMMUNITY): Payer: Medicare Other

## 2017-07-16 DIAGNOSIS — R531 Weakness: Secondary | ICD-10-CM

## 2017-07-16 DIAGNOSIS — J9621 Acute and chronic respiratory failure with hypoxia: Secondary | ICD-10-CM

## 2017-07-16 DIAGNOSIS — Z515 Encounter for palliative care: Secondary | ICD-10-CM

## 2017-07-16 LAB — COMPREHENSIVE METABOLIC PANEL
ALK PHOS: 75 U/L (ref 38–126)
ALT: 10 U/L — ABNORMAL LOW (ref 17–63)
ANION GAP: 11 (ref 5–15)
AST: 38 U/L (ref 15–41)
Albumin: 2.5 g/dL — ABNORMAL LOW (ref 3.5–5.0)
BILIRUBIN TOTAL: 0.8 mg/dL (ref 0.3–1.2)
BUN: 12 mg/dL (ref 6–20)
CALCIUM: 8.9 mg/dL (ref 8.9–10.3)
CO2: 26 mmol/L (ref 22–32)
Chloride: 105 mmol/L (ref 101–111)
Creatinine, Ser: 0.61 mg/dL (ref 0.61–1.24)
GFR calc non Af Amer: >60 mL/min (ref >60)
Glucose, Bld: 166 mg/dL — ABNORMAL HIGH (ref 65–99)
POTASSIUM: 4.1 mmol/L (ref 3.5–5.1)
SODIUM: 142 mmol/L (ref 135–145)
TOTAL PROTEIN: 5.6 g/dL — AB (ref 6.5–8.1)

## 2017-07-16 LAB — PHOSPHORUS: PHOSPHORUS: 2.4 mg/dL — AB (ref 2.5–4.6)

## 2017-07-16 LAB — CBC WITH DIFFERENTIAL/PLATELET
Basophils Absolute: 0 10*3/uL (ref 0.0–0.1)
Basophils Relative: 0 %
EOS ABS: 0.3 10*3/uL (ref 0.0–0.7)
Eosinophils Relative: 2 %
HCT: 41.9 % (ref 39.0–52.0)
HEMOGLOBIN: 14 g/dL (ref 13.0–17.0)
LYMPHS ABS: 1.7 10*3/uL (ref 0.7–4.0)
Lymphocytes Relative: 14 %
MCH: 31.6 pg (ref 26.0–34.0)
MCHC: 33.4 g/dL (ref 30.0–36.0)
MCV: 94.6 fL (ref 78.0–100.0)
MONO ABS: 1.2 10*3/uL — AB (ref 0.1–1.0)
Monocytes Relative: 10 %
NEUTROS PCT: 74 %
Neutro Abs: 9 10*3/uL — ABNORMAL HIGH (ref 1.7–7.7)
Platelets: 167 10*3/uL (ref 150–400)
RBC: 4.43 MIL/uL (ref 4.22–5.81)
RDW: 16.4 % — AB (ref 11.5–15.5)
WBC: 12.3 10*3/uL — ABNORMAL HIGH (ref 4.0–10.5)

## 2017-07-16 LAB — MAGNESIUM: Magnesium: 1.5 mg/dL — ABNORMAL LOW (ref 1.7–2.4)

## 2017-07-16 MED ORDER — LEVALBUTEROL HCL 0.63 MG/3ML IN NEBU
0.6300 mg | INHALATION_SOLUTION | Freq: Four times a day (QID) | RESPIRATORY_TRACT | Status: DC
  Administered 2017-07-16: 0.63 mg via RESPIRATORY_TRACT
  Filled 2017-07-16: qty 3

## 2017-07-16 MED ORDER — LORAZEPAM 2 MG/ML IJ SOLN
1.0000 mg | Freq: Once | INTRAMUSCULAR | Status: AC | PRN
  Administered 2017-07-16: 1 mg via INTRAVENOUS
  Filled 2017-07-16: qty 1

## 2017-07-16 MED ORDER — MAGNESIUM SULFATE 50 % IJ SOLN
3.0000 g | Freq: Once | INTRAMUSCULAR | Status: AC
  Administered 2017-07-16: 3 g via INTRAVENOUS
  Filled 2017-07-16: qty 6

## 2017-07-16 MED ORDER — HYDRALAZINE HCL 20 MG/ML IJ SOLN
10.0000 mg | Freq: Four times a day (QID) | INTRAMUSCULAR | Status: DC | PRN
  Administered 2017-07-16 – 2017-07-17 (×2): 10 mg via INTRAVENOUS
  Filled 2017-07-16 (×3): qty 1

## 2017-07-16 MED ORDER — ACYCLOVIR 200 MG PO CAPS
400.0000 mg | ORAL_CAPSULE | Freq: Two times a day (BID) | ORAL | Status: DC
  Administered 2017-07-17: 400 mg via ORAL
  Filled 2017-07-16 (×3): qty 2

## 2017-07-16 MED ORDER — POTASSIUM PHOSPHATES 15 MMOLE/5ML IV SOLN
10.0000 mmol | Freq: Once | INTRAVENOUS | Status: AC
  Administered 2017-07-16: 10 mmol via INTRAVENOUS
  Filled 2017-07-16: qty 3.33

## 2017-07-16 MED ORDER — IPRATROPIUM BROMIDE 0.02 % IN SOLN
0.5000 mg | Freq: Four times a day (QID) | RESPIRATORY_TRACT | Status: DC
  Administered 2017-07-16: 0.5 mg via RESPIRATORY_TRACT
  Filled 2017-07-16: qty 2.5

## 2017-07-16 NOTE — Progress Notes (Signed)
Report received from [facility] RN. No changes at this time with Pt's assessment maintain current plan of care.

## 2017-07-16 NOTE — Progress Notes (Signed)
PROGRESS NOTE    Darryl Carroll  ZOX:[id number] DOB: 1927-03-30 DOA: 07/12/2017 PCP: Merlene Laughter, MD   Brief Narrative:  Darryl Carroll is a 84 y.o.malewith medical history significant of CAD, DM 2, gout, HTN, HLD, paroxysmal atrial fibrillation, OSA, chronic back pain, peptic ulcer disease, parkinson disease with dementia who presents from [facility] for lethargy after vomiting up lunch.   The Admission CXR showed Perihilar airspace opacification may be due to edema or pneumonia. Aspiration is not excluded.  It was suspected that he aspirated and he was started on Vanc and Zosyn for HCAP, however repeat CXR showed no consolidation. Likely the patient has a UTI that was present on Admission and is currently being treated.  Patient was a little bit more lethargic today and WBC slightly worsened.  Blood cultures were ordered to evaluate worsening WBC's, however never obtained as patient continuously refused.  MRI of the brain was done to evaluate for patient's new lethargy and showed no acute intracranial abnormalities however after the patient's MRI he started becoming slightly hypoxic likely secondary to his lorazepam administration prior to laying in the MRI.  Patient was placed on 2 L of O2 via nasal cannula and saturations improved  Assessment & Plan:   Active Problems:   Hyperlipidemia   Hypertension   Coronary atherosclerosis   FIBRILLATION, ATRIAL   Sleep apnea   Long term (current) use of anticoagulants   PD (Parkinson's disease) (HCC)   Dementia due to Parkinson's disease without behavioral disturbance (HCC)   Hypokalemia   Aspiration pneumonia (HCC)   Nonsustained ventricular tachycardia (HCC)   Palliative care by specialist   DNR (do not resuscitate)   Adult failure to thrive  Acute metabolic encephalopathy in the setting of Dementia and suspected UTI; CAP ruled out   -Repeat CXR shows no consolidation and therefore, he does not have a pneumonia -He does have a + UA  and I have ordered a culture on 5/6 keeping in mind that he is already on antibiotics and will continue on IV Zosyn -Lethargy has improved -Repeat Culture from 5/6 was NEVER sent so re-ordered and sent early this AM. Repeat UA shows WBC dropped from large to moderate, ongoing microscopic hematuria and rare bacteria - will cont to treat as a UTI- reasssed for hematuria once UTI resolved (on Xarelto) -MRSA PCR neg- stopped Vancomycin -Will continue to Treat with IV Zosyn and De-escalate accordingly  -patient was a little bit more lethargic today so ordered a head MRI without contrast and it showed no acute intracranial abnormalities, small chronic proximal left caudate head and left occipital lobe, and stable moderate for age chronic microvascular ischemic changes of parenchymal volume loss of the brain. -Post MRI patient's saturations started dropping likely from 1 mg of IV Lorazepam and he is slightly more altered.  We will continue to monitor and will hold further benzodiazepine doses -Continue with Delirium Precautions  Dysphagia -SLP notes high risk for aspiration but has recommended Dysphagia 3 Diet with clear liquids and Is recommending an esophageal assessment- No further work up- family wants him to eat regardless and asked for Regular Diet; Patient is currently on Regular diet since yesterday  -Palliative Care involved and they will follow at SNF  Chronic Back Pain -Lumbar MRI showed Age-indeterminate compression fractures at T11, L1 and L2. Minimal bone marrow edema. These are probably subacute.  Retropulsion of the posterior aspects of the T11 and L1 vertebrae without spinal canal stenosis. Moderate right L2-3 and bilateral L4-5 neural  foraminal stenosis.. Grade 1 L4-5 anterolisthesis due to severe facet arthrosis. -C/w Acetaminophen 650 mg po/RC for Mild Pain and Tramadol 50 mg po q8hprn Moderate Pain   Vomiting,  Improved -Improved. Likely in the setting of UTI -Diet now Regular  Diet per Family request knowing risks of aspiration  PD (Parkinson's disease) Dementia due to Parkinson's disease with  behavioral disturbance -Continue with Carbidopa-Levodopa 25-100 mg p.o. 3 times daily as well as Sertraline 100 mg p.o. Daily -Delirium precautions   Atrial Fibrillation -Continue with Atenolol 100 mg p.o. Daily and Diltiazem 120 mg p.o. daily for rate control -Continue with Rivaroxaban 15 mg p.o. nightly for anticoagulation  DM2 -Continue to hold Metformin at this time\ -Blood Sugars on BMP's have been ranging from 138-194 -Continue to Monitor and if Necessary Add SSI   Hypokalemia -Patient's potassium level was 3.1 and improved to 4.1 -Continue to monitor and replete as necessary -Repeat CMP in a.m.  Hypomagnesemia  -Patient magnesium this morning was 1.4 -Replete with IV mag sulfate 3 g -Continue to monitor and replete as necessary -Repeat magnesium level in a.m.  Hypophosphatemia  -Patient's phosphorus level this morning was 2.4 -Replete with IV K-Phos 10 mmol -Continue to monitor and replete as necessary -Repeat phosphorus level in the morning  Thrombocytopenia, improved  -Patient's Platelet count is steadily trending downwards and went from 192 and was147 but now improved to 167 -Continue to monitor for signs and symptoms of bleeding as patient is anticoagulated with Rivaroxaban -Repeat CBC in AM  Hypoxia -Patient saturation dropped after he was administered  1 mg IV lorazepam for his MRI -Was placed on 2 L of nasal cannula with improvement -Continue to monitor respiratory status extremely closely -Chest x-ray early this morning showed slight increase in basilar opacities bilaterally  Leukocytosis -Slightly worsened as WBC went from 8.9 is now 12.3 -Ordered blood cultures x2 as they were never ordered however patient continues to refuse them so they were never drawn -Continue to monitor for signs and symptoms of infection continue current  antibiotic treatment at this time. -I think it showed slight increase in basilar opacities bilaterally -Urine culture still pending -Continue to monitor and repeat CBC in a.m.  DVT prophylaxis: Anticoagulated with Rivaroxaban Code Status: DO NOT RESUSCITATE Family Communication: No family present at bedside  Disposition Plan: Anticipate discharge back to SNF in a.m.  Consultants:   Palliative Care Medicine    Procedures: None   Antimicrobials:  Anti-infectives (From admission, onward)   Start     Dose/Rate Route Frequency Ordered Stop   07/13/17 0800  vancomycin (VANCOCIN) IVPB 750 mg/150 ml premix  Status:  Discontinued     750 mg 150 mL/hr over 60 Minutes Intravenous Every 24 hours 07/13/17 0318 07/14/17 0854   07/12/17 2359  piperacillin-tazobactam (ZOSYN) IVPB 3.375 g     3.375 g 12.5 mL/hr over 240 Minutes Intravenous Every 8 hours 07/12/17 2210     07/12/17 1745  piperacillin-tazobactam (ZOSYN) IVPB 3.375 g     3.375 g 100 mL/hr over 30 Minutes Intravenous  Once 07/12/17 1740 07/12/17 1852   07/12/17 1745  vancomycin (VANCOCIN) IVPB 1000 mg/200 mL premix     1,000 mg 200 mL/hr over 60 Minutes Intravenous  Once 07/12/17 1740 07/12/17 1926     Subjective: Seen and examined and is demented and was more lethargic today.  States that his back was hurting and would not really communicate very much today compared to yesterday.  Denies any chest pain, shortness breath, nausea,  vomiting.  No family present at bedside.  Objective: Vitals:   07/15/17 0505 07/15/17 1500 07/15/17 2119 07/16/17 0441  BP: (!) 154/96 (!) 146/99 (!) 167/93 (!) 171/93  Pulse: 76 68 73 (!) 55  Resp:  Temp:  98.3 F (36.8 C) 97.7 F (36.5 C) 98.1 F (36.7 C)  TempSrc:  Oral Oral Oral  SpO2:  98% 92% 95%  Weight:      Height:        Intake/Output Summary (Last 24 hours) at 07/16/2017 0742 Last data filed at 07/16/2017 0600 Gross per 24 hour  Intake 270 ml  Output 900 ml  Net -630 ml     Filed Weights   07/13/17 0251  Weight: 51.6 kg (113 lb 12.1 oz)   Examination: Physical Exam:  Constitutional: Thin elderly Caucasian male who is more somnolent today but easily arousable.  Currently in no acute distress and appears calm Eyes: Lids and conjunctive are normal.  Sclera anicteric ENMT: External ears and nose appear normal.  Mucous membranes moist Neck: Appears supple with no JVD Respiratory: Diminished to auscultation bilaterally with coarse breath sounds no appreciable wheezing rales or rhonchi.  Patient is not tachypneic using accessory muscles to breathe Cardiovascular: Irregularly irregular.  Mild lower extremity edema Abdomen: Soft, nontender, nondistended.  Bowel sounds present all 4 quadrants GU: Deferred Musculoskeletal: No joint deformities noted; no cyanosis or contractures Skin: No appreciable rashes or lesions on limited skin evaluation Neurologic: Cranial nerves II through XII grossly intact no appreciable focal deficits Psychiatric: Impaired judgment and insight today.  Was more somnolent but arousable.  Continues to be pleasantly demented.  Had  a depressed mood and flat affect today  Data Reviewed: I have personally reviewed following labs and imaging studies  CBC: Recent Labs  Lab 07/12/17 1725 07/13/17 0144 07/14/17 0536 07/15/17 1913 07/16/17 0452  WBC 16.1* 17.8* 8.9 10.6* 12.3*  NEUTROABS 14.9*  --   --  7.9* 9.0*  HGB 14.9 13.2 11.8* 12.8* 14.0  HCT 45.0 38.9* 35.7* 39.5 41.9  MCV 93.4 94.2 95.2 94.5 94.6  PLT 192 163 147* 179 167   Basic Metabolic Panel: Recent Labs  Lab 07/12/17 1724 07/12/17 1725 07/13/17 0144 07/14/17 0536 07/15/17 1913 07/16/17 0452  NA  --  139 138 140 139 142  K  --  3.5 3.9 3.1* 3.5 4.1  CL  --  104 105 109 108 105  CO2  --  GLUCOSE  --  178* 194* 138* 191* 166*  BUN  --  24* 27* 23* 14 12  CREATININE  --  0.87 1.06 0.89 0.61 0.61  CALCIUM  --  8.8* 8.4* 8.4* 8.6* 8.9  MG 1.2*  --   1.4* 1.7 1.4* 1.5*  PHOS  --   --  4.1  --  2.4* 2.4*   GFR: Estimated Creatinine Clearance: 46.6 mL/min (by C-G formula based on SCr of 0.61 mg/dL). Liver Function Tests: Recent Labs  Lab 07/12/17 1725 07/13/17 0144 07/15/17 1913 07/16/17 0452  AST 29 26 45* 38  ALT 24 23 8* 10*  ALKPHOS 86 75 72 75  BILITOT 1.3* 1.5* 0.6 0.8  PROT 5.8* 5.5* 5.4* 5.6*  ALBUMIN 2.7* 2.5* 2.3* 2.5*   No results for input(s): LIPASE, AMYLASE in the last 168 hours. No results for input(s): AMMONIA in the last 168 hours. Coagulation Profile: No results for input(s): INR, PROTIME in the last 168 hours. Cardiac Enzymes: Recent Labs  Lab 07/12/17 1725 07/12/17 1953 07/13/17 0144 07/13/17 0841  TROPONINI <0.03 <0.03 <0.03 0.03*   BNP (last 3 results) No results for input(s): PROBNP in the last 8760 hours. HbA1C: No results for input(s): HGBA1C in the last 72 hours. CBG: No results for input(s): GLUCAP in the last 168 hours. Lipid Profile: No results for input(s): CHOL, HDL, LDLCALC, TRIG, CHOLHDL, LDLDIRECT in the last 72 hours. Thyroid Function Tests: No results for input(s): TSH, T4TOTAL, FREET4, T3FREE, THYROIDAB in the last 72 hours. Anemia Panel: No results for input(s): VITAMINB12, FOLATE, FERRITIN, TIBC, IRON, RETICCTPCT in the last 72 hours. Sepsis Labs: No results for input(s): PROCALCITON, LATICACIDVEN in the last 168 hours.  Recent Results (from the past 240 hour(s))  MRSA PCR Screening     Status: None   Collection Time: 07/13/17  8:41 AM  Result Value Ref Range Status   MRSA by PCR NEGATIVE NEGATIVE Final    Comment:        The GeneXpert MRSA Assay (FDA approved for NASAL specimens only), is one component of a comprehensive MRSA colonization surveillance program. It is not intended to diagnose MRSA infection nor to guide or monitor treatment for MRSA infections. Performed at [facility], [address] [place], [place] [address]     Radiology  Studies: Mr Lumbar Spine Limited Wo Contrast  Result Date: 07/14/2017 CLINICAL DATA:  Suspected vertebral fracture. Steroid therapy for 3 months. EXAM: MRI LUMBAR SPINE WITHOUT CONTRAST TECHNIQUE: Multiplanar, multisequence MR imaging of the lumbar spine was performed. No intravenous contrast was administered. COMPARISON:  None. FINDINGS: Segmentation: Normal. The lowest disc space is considered to be L5-S1. Alignment:  Grade 1 L4-L5 anterolisthesis. Vertebrae: Severe compression deformities at T11 and L1. Moderate compression deformity at L2. There is mild bone marrow edema within the right side of the T11 vertebral body and centrally within the L1 body. At T11, there is retropulsion causing mass effect on the posterior longitudinal ligament and extending 7 mm into the spinal canal. This results in no greater than mild spinal canal narrowing. There is edema within the disc spaces at T11-12 and T12-L1. Conus medullaris and cauda equina: The conus medullaris terminates at the L1 level. The cauda equina and conus medullaris are both normal. Paraspinal and other soft tissues: Fatty atrophy of the posterior paraspinous musculature. Disc levels: T9-T12 is imaged only in the sagittal plane. As above, there are in retropulsion of the posterior wall of T11 that mildly narrows the spinal canal. Spinal canal is otherwise widely patent. The T12-S1 disc levels are described individually below. T12-L1: Disc height is preserved. No spinal canal or neural foraminal stenosis. There is minimal retropulsion of the posterosuperior corner of L1, measuring 3 mm. L1-L2: Disc desiccation and mild height loss.  No stenosis. L2-L3: Disc desiccation with mild height loss and small bilateral foraminal protrusions. No spinal canal stenosis. Moderate right and mild left neural foraminal stenosis. L3-L4: Mild disc bulge without stenosis. L4-L5: Grade 1 anterolisthesis due to severe bilateral facet hypertrophy. No spinal canal stenosis.  Moderate bilateral neural foraminal stenosis. L5-S1: Normal. IMPRESSION: 1. Age-indeterminate compression fractures at T11, L1 and L2. Minimal bone marrow edema. These are probably subacute. 2. Retropulsion of the posterior aspects of the T11 and L1 vertebrae without spinal canal stenosis. 3. Moderate right L2-3 and bilateral L4-5 neural foraminal stenosis. 4. Grade 1 L4-5 anterolisthesis due to severe facet arthrosis. Electronically Signed   By: Deatra Robinson M.D.   On: 07/14/2017 18:27   Scheduled Meds: .  atenolol  100 mg Oral Daily  . carbidopa-levodopa  1 tablet Oral TID  . diltiazem  120 mg Oral Daily  . feeding supplement (ENSURE ENLIVE)  237 mL Oral BID BM  . mouth rinse  15 mL Mouth Rinse BID  . Rivaroxaban  15 mg Oral QHS  . sertraline  100 mg Oral Daily   Continuous Infusions: . magnesium sulfate 1 - 4 g bolus IVPB    . piperacillin-tazobactam (ZOSYN)  IV 3.375 g (07/16/17 0741)  . potassium PHOSPHATE IVPB (mmol)      LOS: 4 days   Merlene Laughter, DO [facility] Pager [phone]  If 7PM-7AM, please contact night-coverage [email or web address] Password [email or web address] 07/16/2017, 7:42 AM

## 2017-07-16 NOTE — Progress Notes (Signed)
MD ordered blood cultures. Patient refusing at this time. RN attempted multiple times to speak with patient and educated him on why the lab was ordered but patient still refusing.  Will notify MD of refusal.

## 2017-07-16 NOTE — Progress Notes (Signed)
SLP Cancellation Note  Patient Details Name: Darryl Carroll MRN: [id number] DOB: [birth year]   Cancelled treatment:       Reason Eval/Treat Not Completed: Other (comment)(nurse tech reporting pt needs to be cleaned up, will continue efforts to follow up and pt indicating pain/discomfort)   Donavan Burnet, MS [facility] SLP [phone]

## 2017-07-16 NOTE — Progress Notes (Signed)
Pharmacy Antibiotic Note  Darryl Carroll is a 84 y.o. male admitted on 07/12/2017 with aspiration PNA.  Pharmacy has been consulted for zosyn and vancomcyin dosing.   Patient currently on zosyn for suspected UTI as PNA has been ruled out per physician notes.   Today, 07/16/2017 Day #5 zosyn WBC mildly elevated Afebrile 5/6 culture collected per documentation but not received by lab   Plan:  Zosyn 3.375g IV q8h (4 hour infusion).   Repeat UCx pending from 5/9 (on antibiotics for ~5 days)  Follow-up length of therapy.  Consider stopping after today's doses if clinically appropriate.  Pharmacy to follow peripherally    F/u cultures/levels as needed Height:  (167.6 cm) Weight: 113 lb 12.1 oz (51.6 kg) IBW/kg (Calculated) : 63.8  Temp (24hrs), Avg:98 F (36.7 C), Min:97.7 F (36.5 C), Max:98.3 F (36.8 C)  Recent Labs  Lab 07/12/17 1725 07/13/17 0144 07/14/17 0536 07/15/17 1913 07/16/17 0452  WBC 16.1* 17.8* 8.9 10.6* 12.3*  CREATININE 0.87 1.06 0.89 0.61 0.61    Estimated Creatinine Clearance: 46.6 mL/min (by C-G formula based on SCr of 0.61 mg/dL).    Allergies  Allergen Reactions  . Contrast Media [Iodinated Diagnostic Agents] Other (See Comments)    Reaction when stints put in  . Other     Bee stings  . Zolpidem Other (See Comments)    unknown    Antimicrobials this admission: 5/5 zosyn >>  5/5 vancomycin >>   Dose adjustments this admission:   Microbiology results:  BCx:   UCx:    Sputum:    MRSA PCR:   Thank you for allowing pharmacy to be a part of this patient's care.  Juliette Alcide, PharmD, BCPS.   Pager: [phone] 07/16/2017 7:36 AM

## 2017-07-16 NOTE — Plan of Care (Signed)
  Problem: Nutrition: Goal: Adequate nutrition will be maintained Outcome: Progressing   Problem: Elimination: Goal: Will not experience complications related to bowel motility Outcome: Progressing   Problem: Pain Managment: Goal: General experience of comfort will improve Outcome: Progressing   Problem: Safety: Goal: Ability to remain free from injury will improve Outcome: Progressing   

## 2017-07-17 DIAGNOSIS — Z66 Do not resuscitate: Secondary | ICD-10-CM

## 2017-07-17 DIAGNOSIS — N390 Urinary tract infection, site not specified: Secondary | ICD-10-CM

## 2017-07-17 LAB — COMPREHENSIVE METABOLIC PANEL
ALBUMIN: 2.6 g/dL — AB (ref 3.5–5.0)
ALT: 22 U/L (ref 17–63)
AST: 25 U/L (ref 15–41)
Alkaline Phosphatase: 77 U/L (ref 38–126)
Anion gap: 11 (ref 5–15)
BUN: 12 mg/dL (ref 6–20)
CALCIUM: 8.9 mg/dL (ref 8.9–10.3)
CO2: 26 mmol/L (ref 22–32)
CREATININE: 0.77 mg/dL (ref 0.61–1.24)
Chloride: 104 mmol/L (ref 101–111)
GFR calc Af Amer: >60 mL/min (ref >60)
GFR calc non Af Amer: >60 mL/min (ref >60)
GLUCOSE: 161 mg/dL — AB (ref 65–99)
Potassium: 3.8 mmol/L (ref 3.5–5.1)
SODIUM: 141 mmol/L (ref 135–145)
Total Bilirubin: 0.8 mg/dL (ref 0.3–1.2)
Total Protein: 6.1 g/dL — ABNORMAL LOW (ref 6.5–8.1)

## 2017-07-17 LAB — CBC WITH DIFFERENTIAL/PLATELET
BASOS PCT: 0 %
Basophils Absolute: 0 10*3/uL (ref 0.0–0.1)
EOS ABS: 0.2 10*3/uL (ref 0.0–0.7)
Eosinophils Relative: 2 %
HCT: 44.7 % (ref 39.0–52.0)
Hemoglobin: 15 g/dL (ref 13.0–17.0)
Lymphocytes Relative: 12 %
Lymphs Abs: 1.5 10*3/uL (ref 0.7–4.0)
MCH: 31.8 pg (ref 26.0–34.0)
MCHC: 33.6 g/dL (ref 30.0–36.0)
MCV: 94.7 fL (ref 78.0–100.0)
Monocytes Absolute: 1.1 10*3/uL — ABNORMAL HIGH (ref 0.1–1.0)
Monocytes Relative: 8 %
NEUTROS PCT: 78 %
Neutro Abs: 10.4 10*3/uL — ABNORMAL HIGH (ref 1.7–7.7)
Platelets: 189 10*3/uL (ref 150–400)
RBC: 4.72 MIL/uL (ref 4.22–5.81)
RDW: 16.6 % — ABNORMAL HIGH (ref 11.5–15.5)
WBC: 13.2 10*3/uL — AB (ref 4.0–10.5)

## 2017-07-17 LAB — PHOSPHORUS: Phosphorus: 3.5 mg/dL (ref 2.5–4.6)

## 2017-07-17 LAB — URINE CULTURE

## 2017-07-17 LAB — MAGNESIUM: Magnesium: 1.9 mg/dL (ref 1.7–2.4)

## 2017-07-17 MED ORDER — POLYETHYLENE GLYCOL 3350 17 G PO PACK: 17.0000 g | PACK | Freq: Every day | ORAL | 0 refills | Status: AC | PRN

## 2017-07-17 MED ORDER — TRAMADOL HCL 50 MG PO TABS: 50.0000 mg | ORAL_TABLET | Freq: Three times a day (TID) | ORAL | 0 refills | Status: AC | PRN

## 2017-07-17 MED ORDER — SENNOSIDES-DOCUSATE SODIUM 8.6-50 MG PO TABS
1.0000 | ORAL_TABLET | Freq: Two times a day (BID) | ORAL | Status: DC
  Administered 2017-07-17: 1 via ORAL
  Filled 2017-07-17: qty 1

## 2017-07-17 MED ORDER — SENNOSIDES-DOCUSATE SODIUM 8.6-50 MG PO TABS: 1.0000 | ORAL_TABLET | Freq: Every day | ORAL | Status: AC

## 2017-07-17 MED ORDER — CEFDINIR 300 MG PO CAPS: 300.0000 mg | ORAL_CAPSULE | Freq: Two times a day (BID) | ORAL | Status: DC

## 2017-07-17 MED ORDER — POLYETHYLENE GLYCOL 3350 17 G PO PACK
17.0000 g | PACK | Freq: Two times a day (BID) | ORAL | Status: DC
  Administered 2017-07-17: 17 g via ORAL
  Filled 2017-07-17: qty 1

## 2017-07-17 MED ORDER — CEFDINIR 300 MG PO CAPS: 300.0000 mg | ORAL_CAPSULE | Freq: Two times a day (BID) | ORAL | 0 refills | Status: AC

## 2017-07-17 MED ORDER — ALBUTEROL SULFATE (2.5 MG/3ML) 0.083% IN NEBU: 2.5000 mg | INHALATION_SOLUTION | RESPIRATORY_TRACT | 12 refills | Status: AC | PRN

## 2017-07-17 MED ORDER — ENSURE ENLIVE PO LIQD: 237.0000 mL | Freq: Two times a day (BID) | ORAL | 12 refills | Status: AC

## 2017-07-17 MED ORDER — ALUM & MAG HYDROXIDE-SIMETH 200-200-20 MG/5ML PO SUSP: 30.0000 mL | ORAL | 0 refills | Status: AC | PRN

## 2017-07-17 MED ORDER — BISACODYL 10 MG RE SUPP
10.0000 mg | Freq: Once | RECTAL | Status: DC
  Filled 2017-07-17: qty 1

## 2017-07-17 NOTE — Progress Notes (Signed)
Attempted call to nursing supervisor at [facility] to give report.  Left message and will re-attempt if not heard back from within reasonable amount of time. Nino Parsley

## 2017-07-17 NOTE — Progress Notes (Signed)
Patient returning to [facility] SN[facility]. Facility aware of patient's discharge and confirmed patient's ability to return. PTAR contacted, CSW left voicemail for patient's son Daiton Cowles) and patient's wife Keawe Marcello). Patient's RN can call report to [phone], packet complete. CSW signing off, no other needs identified at this time.  Celso Sickle, [place] Clinical Social Worker [facility] Cell#: [phone]

## 2017-07-17 NOTE — Progress Notes (Signed)
This RN attempted to retake patients blood pressure and patient hit RN hand twice. Patient stated "if you touch me I will hit you".     Lab attempting to draw blood on patient and patient yelled "NO". Asked lab to retime for later in the morning.

## 2017-07-17 NOTE — Progress Notes (Signed)
Report called to [facility] RN. Nino Parsley

## 2017-07-17 NOTE — Progress Notes (Signed)
Nutrition Follow-up  DOCUMENTATION CODES:   Underweight  INTERVENTION:  - Will decrease Ensure Enlive to once/day. - Continue to encourage PO intakes.  NUTRITION DIAGNOSIS:   Inadequate oral intake related to dysphagia, nausea, vomiting as evidenced by per patient/family report. -improving with improvement in symptoms and family helping to feed pt.   GOAL:   Patient will meet greater than or equal to 90% of their needs - likely unmet.  MONITOR:   PO intake, Supplement acceptance, Labs, Weight trends, Skin, I & O's  ASSESSMENT:   84 y.o. male with medical history significant of CAD, DM 2, gout, HTN, HLD, paroxysmal atrial fibrillation, OSA, chronic back pain, peptic ulcer disease, parkinson disease  No new weight since admission. Per chart review, pt is a/o to self only; he is unable to provide any information. Per flow sheet, he consumed 0% of breakfast, 100% of lunch (752 kcal, 34 grams of protein), and dinner not recorded yesterday. On 5/7 he consumed 25% of both breakfast and lunch (combined total of 302 kcal, 6 grams of protein).   SLP is following pt but was unable to see him yesterday afternoon. Per Dr. Elizabeth Palau note yesterday AM: repeat CXR indicates pt does not have PNA, lethargy has improved, family aware of dysphagia and risk of aspiration but wants pt to eat and be on a Regular diet, Palliative care following and likely to continue to follow at SNF, vomiting has improved and thought to be d/t UTI, hx of Parkinson's disease and related dementia with behavioral disturbances.  Medications reviewed; 3 g IV Mg sulfate x1 run yesterday, 1 packet Miralax BID, 10 mmol IV KPhos x1 run yesterday, 1 tablet Senokot BID. Labs reviewed; Phos: 2.4 mg/dL, Mg: 1.5 mg/dL.       Diet Order:   Diet Order           Diet regular Room service appropriate? Yes; Fluid consistency: Thin  Diet effective now          EDUCATION NEEDS:   Not appropriate for education at this time  Skin:   Skin Assessment: Reviewed RN Assessment  Last BM:  5/7  Height:   Ht Readings from Last 1 Encounters:  07/13/17  (1.676 m)    Weight:   Wt Readings from Last 1 Encounters:  07/13/17 113 lb 12.1 oz (51.6 kg)    Ideal Body Weight:  64.5 kg  BMI:  Body mass index is 18.36 kg/m.  Estimated Nutritional Needs:   Kcal:  1400-1600  Protein:  65-75g  Fluid:  1.6L/day      Trenton Gammon, MS, RD, LDN, CNSC Inpatient Clinical Dietitian Pager # [phone] After hours/weekend pager # [phone]

## 2017-07-17 NOTE — Discharge Summary (Addendum)
Physician Discharge Summary  Darryl Carroll ZOX:[id number] DOB: [birth year] DOA: 07/12/2017  PCP: Merlene Laughter, MD  Admit date: 07/12/2017 Discharge date: 07/17/2017  Admitted From: SNF Disposition: SNF  Recommendations for Outpatient Follow-up:  1. Follow up with PCP in 1-2 weeks 2. Palliative care to follow-up at SNF 3. Follow up with Neurology at D/C for Parkinson's disease and dementia 4. Please obtain CMP/CBC, Mag, Phos in one week 5. Please follow up on the following pending results:  Home Health: No  Equipment/Devices: None  Discharge Condition: Stable CODE STATUS: FULL CODE Diet recommendation: Regular Diet with Thin Liquids as family aware he is at risk for aspiration (Previous Diet Recommendation was Dysphagia 3)  Brief/Interim Summary: Darryl Willis Whitlockis a 84 y.o.malewith medical history significant of CAD, DM 2, gout, HTN, HLD, paroxysmal atrial fibrillation, OSA, chronic back pain, peptic ulcer disease, parkinson disease with dementia who presents from [facility] for lethargy after vomiting up lunch.   The Admission CXR showed Perihilar airspace opacification may be due to edema or pneumonia. Aspiration is not excluded. It was suspected that he aspirated and he was started on Vanc and Zosyn for HCAP, however repeat CXR showed no consolidation. Likely the patient has a UTI that was present on Admission and is currently being treated.  Patient was a little bit more lethargic yesterday and WBC slightly worsened.  Blood cultures were ordered to evaluate worsening WBC's, however never obtained as patient continuously refused.  MRI of the brain was done to evaluate for patient's new lethargy and showed no acute intracranial abnormalities however after the patient's MRI he started becoming slightly hypoxic likely secondary to his lorazepam administration prior to laying in the MRI.  Patient was placed on 2 L of O2 via nasal cannula and saturations improved  This morning she  was seen off of oxygen and he had improved.  He is more alert and interactive today.  Still complained of chronic back pain. Leukocytosis slightly worsened in 1-13.2 however likely secondary to pain as patient is afebrile and currently being treated with antibiotics for his UTI with Zosyn.  If continues to worsen or spike temperature and recommending panculture again as an outpatient.  She was deemed medically stable to be discharged at this time and will need palliative care to follow-up at SNF.  Discharge Diagnoses:  Active Problems:   Hyperlipidemia   Hypertension   Coronary atherosclerosis   FIBRILLATION, ATRIAL   Sleep apnea   Long term (current) use of anticoagulants   PD (Parkinson's disease) (HCC)   Dementia due to Parkinson's disease without behavioral disturbance (HCC)   Hypokalemia   Aspiration pneumonia (HCC)   Nonsustained ventricular tachycardia (HCC)   Palliative care by specialist   DNR (do not resuscitate)   Adult failure to thrive   Weakness generalized  Acute metabolic encephalopathy in the setting of Dementia and suspected UTI; CAP ruled out   -RepeatCXR shows no consolidation and therefore, he does not have a pneumonia -He does have a + UA and I have ordered a cultureon 5/6keeping in mind that he is already on antibiotics and will continue on IV Zosyn -Lethargy has improved -Repeat Culture from 5/6 was NEVER sent so re-ordered and sent early yesteday AM showed insignificant growth of less than 10,000 colonies. Repeat UA shows WBC dropped from large to moderate, ongoing microscopic hematuria and rare bacteria - will cont to treat as a UTI- reasssed for hematuria once UTI resolved (on Xarelto) -MRSA PCR neg- stopped Vancomycin -Will continue to Treat  with IV Zosyn while hospitalized and De-escalate  to Cefdinir completion of course. -Patient was a little bit more lethargic yesterdayso ordered a head MRI without contrast and it showed no acute intracranial  abnormalities, small chronic proximal left caudate head and left occipital lobe, and stable 84 for age chronic microvascular ischemic changes of parenchymal volume loss of the brain. -Post MRI patient's saturations started dropping likely from 1 mg of IV Lorazepam and he was slightly more altered but today he is more alert and Interactive -Continue with Delirium Precautions  Dysphagia -SLP notes high risk for aspiration but has recommended Dysphagia 3 Diet with clear liquids and Is recommending an esophageal assessment- No further work up- family wants him to eat regardless and asked for Regular Diet; Patient is currently on Regular diet since yesterday  -Palliative Care involved and they will follow at SNF  Chronic Back Pain -Lumbar MRI showed Age-indeterminate compression fractures at T11, L1 and L2. Minimal bone marrow edema. These are probably subacute.  Retropulsion of the posterior aspects of the T11 and L1 vertebrae without spinal canal stenosis. Moderate right L2-3 and bilateral L4-5 neural foraminal stenosis.. Grade 1 L4-5 anterolisthesis due to severe facet arthrosis. -C/w Acetaminophen 650 mg po/RC for Mild Pain and Tramadol 50 mg po q8hprn Moderate Pain   Vomiting,  Improved -Improved. Likely in the setting of UTI -Diet now Regular Diet per Family request knowing risks of aspiration  PD (Parkinson's disease) Dementia due to Parkinson's disease with behavioral disturbance -Continue with Carbidopa-Levodopa 25-100 mg p.o. 3 times daily as well as Sertraline 100 mg p.o. Daily -Delirium Precautions  Atrial Fibrillation -Continue with Atenolol 100 mg p.o. Daily and Diltiazem 120 mg p.o. daily for rate control -Continue with Rivaroxaban 15 mg p.o. nightly for anticoagulation  DM2 -Resume Metformin at D/C -Blood Sugars on BMP's have been ranging from 138-194 -Continue to Monitor and if Necessary Add SSI   Hypokalemia -Patient's potassium level was 3.1 and improved to  3.8 -Continue to monitor and replete as necessary -Repeat CMP at SNF  Hypomagnesemia  -Patient magnesium was 1.4 and improved to 1.9 -Replete with IV mag sulfate 3 g -Continue to monitor and replete as necessary -Repeat magnesium level in a.m.  Hypophosphatemia  -Patient's phosphorus level this morning was 2.4 and improved to 3.5 -Replete with IV K-Phos 10 mmol yesterday -Continue to monitor and replete as necessary -Repeat phosphorus level in the morning  Thrombocytopenia, improved  -Patient's Platelet count is steadily trending downwards and went from 192 and was147 but now improved to 189 -Continue to monitor for signs and symptoms of bleeding as patient is anticoagulated with Rivaroxaban -Repeat CBC in AM  Hypoxia, improved  -Patient saturation dropped after he was administered  1 mg IV lorazepam for his MRI -Was placed on 2 L of nasal cannula with improvement and now weaned to Room Air -Continue to monitor respiratory status extremely closely -Chest x-ray yesterday showed slight increase in basilar opacities bilaterally -Continue with antibiotics as above  Leukocytosis -Slightly worsened as WBC went from 8.9 is now 13.2 -Ordered blood cultures x2 as they were never ordered however patient continues to refuse them so they were never drawn -Continue to monitor for signs and symptoms of infection continue current antibiotic treatment at this time. -CXR showed slight increase in basilar opacities bilaterally yesterday -C/w Pulmonary Toilet -Urine culture showed <10,000 CFU -Continue to monitor and repeat CBC at SNF  Constipation -Start the patient on Bisacodyl suppository 10 mg -Added MiraLAX 17 g p.o.  twice daily -Also started Senna-Docusate 1 tab twice daily  Discharge Instructions Discharge Instructions    Call MD for:  difficulty breathing, headache or visual disturbances   Complete by:  As directed    Call MD for:  extreme fatigue   Complete by:  As directed     Call MD for:  hives   Complete by:  As directed    Call MD for:  persistant dizziness or light-headedness   Complete by:  As directed    Call MD for:  persistant nausea and vomiting   Complete by:  As directed    Call MD for:  redness, tenderness, or signs of infection (pain, swelling, redness, odor or green/yellow discharge around incision site)   Complete by:  As directed    Call MD for:  severe uncontrolled pain   Complete by:  As directed    Call MD for:  temperature >100.4   Complete by:  As directed    Diet - low sodium heart healthy   Complete by:  As directed    Discharge instructions   Complete by:  As directed    Follow-up with PCP as well as neurology and Palliative Care in outpatient setting.  Take all medications as prescribed.  If symptoms change or worsen return to emergency room for evaluation   Increase activity slowly   Complete by:  As directed      Allergies as of 07/17/2017      Reactions   Contrast Media [iodinated Diagnostic Agents] Other (See Comments)   Reaction when stints put in   Other    Bee stings   Zolpidem Other (See Comments)   unknown      Medication List    STOP taking these medications   aspirin 81 MG EC tablet   doxycycline 100 MG capsule Commonly known as:  VIBRAMYCIN     TAKE these medications   acetaminophen 325 MG tablet Commonly known as:  TYLENOL Take 650 mg by mouth every 6 (six) hours.   acyclovir 400 MG tablet Commonly known as:  ZOVIRAX Take 400 mg by mouth 2 (two) times daily.   albuterol (2.5 MG/3ML) 0.083% nebulizer solution Commonly known as:  PROVENTIL Take 3 mLs (2.5 mg total) by nebulization every 2 (two) hours as needed for wheezing.   alum & mag hydroxide-simeth 200-200-20 MG/5ML suspension Commonly known as:  MAALOX/MYLANTA Take 30 mLs by mouth every 4 (four) hours as needed for indigestion.   atenolol 100 MG tablet Commonly known as:  TENORMIN Take 1 tablet (100 mg total) by mouth daily.    atorvastatin 40 MG tablet Commonly known as:  LIPITOR Take 1 tablet (40 mg total) by mouth daily at 6 PM.   carbidopa-levodopa 25-100 MG tablet Commonly known as:  SINEMET IR Take 1 tablet by mouth 3 (three) times daily.   cefdinir 300 MG capsule Commonly known as:  OMNICEF Take 1 capsule (300 mg total) by mouth every 12 (twelve) hours for 3 doses.   diltiazem 120 MG 24 hr capsule Commonly known as:  CARDIZEM CD Take 120 mg by mouth daily.   feeding supplement (ENSURE ENLIVE) Liqd Take 237 mLs by mouth 2 (two) times daily between meals.   Melatonin 3 MG Tabs Take 3 mg by mouth at bedtime.   metFORMIN 500 MG tablet Commonly known as:  GLUCOPHAGE Take 500 mg by mouth daily with breakfast.   multivitamin with minerals tablet Take 1 tablet by mouth daily.   polyethylene glycol  packet Commonly known as:  MIRALAX / GLYCOLAX Take 17 g by mouth daily as needed.   RISA-BID PROBIOTIC PO Take by mouth.   Rivaroxaban 15 MG Tabs tablet Commonly known as:  XARELTO Take 15 mg by mouth at bedtime.   senna-docusate 8.6-50 MG tablet Commonly known as:  Senokot-S Take 1 tablet by mouth at bedtime.   sertraline 100 MG tablet Commonly known as:  ZOLOFT Take 100 mg by mouth daily.   traMADol 50 MG tablet Commonly known as:  ULTRAM Take 1 tablet (50 mg total) by mouth every 8 (eight) hours as needed for moderate pain.   Vitamin D-3 1000 units Caps Take 1,000 Units by mouth daily.       Contact information for follow-up providers    Merlene Laughter, MD. Call.   Specialty:  Internal Medicine Why:  Follow up within 1 week  Contact information: [address] [place] [address] [phone]            Contact information for after-discharge care    Destination    [facility] .   Service:  Skilled Nursing Contact information: [address] [place] [address] [phone]                 Allergies  Allergen  Reactions  . Contrast Media [Iodinated Diagnostic Agents] Other (See Comments)    Reaction when stints put in  . Other     Bee stings  . Zolpidem Other (See Comments)    unknown   Consultations: Palliative Care Medicine  Procedures/Studies: Ct Head Wo Contrast  Result Date: 07/12/2017 CLINICAL DATA:  Altered mental status.  Minimally verbal. EXAM: CT HEAD WITHOUT CONTRAST TECHNIQUE: Contiguous axial images were obtained from the base of the skull through the vertex without intravenous contrast. COMPARISON:  03/11/2017 FINDINGS: Brain: No evidence of acute infarction, hemorrhage, hydrocephalus, extra-axial collection or mass lesion/mass effect. Stable findings of chronic ischemic infarction of the left occipital lobe, and left basal ganglia. Moderate brain parenchymal volume loss and advanced deep white matter microangiopathy. Vascular: Calcific atherosclerotic disease at the skull base. Skull: Normal. Negative for fracture or focal lesion. Sinuses/Orbits: Polypoid mucosal thickening of the left maxillary sinus. Other: None. IMPRESSION: No acute intracranial abnormality. Stable findings of chronic ischemic infarctions of the left occipital lobe and left basal ganglia. Advanced brain parenchymal atrophy and chronic deep white matter microvascular disease. Electronically Signed   By: Ted Mcalpine M.D.   On: 07/12/2017 17:26   Mr Brain Wo Contrast  Result Date: 07/16/2017 CLINICAL DATA:  84 y/o M; confusion and history of dementia. Evaluate for stroke. EXAM: MRI HEAD WITHOUT CONTRAST TECHNIQUE: Multiplanar, multiecho pulse sequences of the brain and surrounding structures were obtained without intravenous contrast. COMPARISON:  07/12/2017 CT head.  03/11/2017 MRI head. FINDINGS: Brain: No acute infarction, hemorrhage, hydrocephalus, extra-axial collection or mass lesion. Small chronic infarct in the left occipital lobe. Small chronic infarct within the left caudate head. Stable patchy confluent  nonspecific foci of T2 FLAIR hyperintense signal abnormality in subcortical and periventricular white matter are compatible with moderate chronic microvascular ischemic changes for age. Moderate brain parenchymal volume loss. Vascular: Normal flow voids. Skull and upper cervical spine: Normal marrow signal. Sinuses/Orbits: Small left maxillary sinus mucous retention cyst. Bilateral intra-ocular lens replacement. Other: None. IMPRESSION: 1. No acute intracranial abnormality. 2. Small chronic infarcts in left caudate head and left occipital lobe. 3. Stable moderate for age chronic microvascular ischemic changes and parenchymal volume loss  of the brain. Electronically Signed   By: Mitzi Hansen M.D.   On: 07/16/2017 17:15   Mr Lumbar Spine Limited Wo Contrast  Result Date: 07/14/2017 CLINICAL DATA:  Suspected vertebral fracture. Steroid therapy for 3 months. EXAM: MRI LUMBAR SPINE WITHOUT CONTRAST TECHNIQUE: Multiplanar, multisequence MR imaging of the lumbar spine was performed. No intravenous contrast was administered. COMPARISON:  None. FINDINGS: Segmentation: Normal. The lowest disc space is considered to be L5-S1. Alignment:  Grade 1 L4-L5 anterolisthesis. Vertebrae: Severe compression deformities at T11 and L1. Moderate compression deformity at L2. There is mild bone marrow edema within the right side of the T11 vertebral body and centrally within the L1 body. At T11, there is retropulsion causing mass effect on the posterior longitudinal ligament and extending 7 mm into the spinal canal. This results in no greater than mild spinal canal narrowing. There is edema within the disc spaces at T11-12 and T12-L1. Conus medullaris and cauda equina: The conus medullaris terminates at the L1 level. The cauda equina and conus medullaris are both normal. Paraspinal and other soft tissues: Fatty atrophy of the posterior paraspinous musculature. Disc levels: T9-T12 is imaged only in the sagittal plane. As above,  there are in retropulsion of the posterior wall of T11 that mildly narrows the spinal canal. Spinal canal is otherwise widely patent. The T12-S1 disc levels are described individually below. T12-L1: Disc height is preserved. No spinal canal or neural foraminal stenosis. There is minimal retropulsion of the posterosuperior corner of L1, measuring 3 mm. L1-L2: Disc desiccation and mild height loss.  No stenosis. L2-L3: Disc desiccation with mild height loss and small bilateral foraminal protrusions. No spinal canal stenosis. Moderate right and mild left neural foraminal stenosis. L3-L4: Mild disc bulge without stenosis. L4-L5: Grade 1 anterolisthesis due to severe bilateral facet hypertrophy. No spinal canal stenosis. Moderate bilateral neural foraminal stenosis. L5-S1: Normal. IMPRESSION: 1. Age-indeterminate compression fractures at T11, L1 and L2. Minimal bone marrow edema. These are probably subacute. 2. Retropulsion of the posterior aspects of the T11 and L1 vertebrae without spinal canal stenosis. 3. Moderate right L2-3 and bilateral L4-5 neural foraminal stenosis. 4. Grade 1 L4-5 anterolisthesis due to severe facet arthrosis. Electronically Signed   By: Deatra Robinson M.D.   On: 07/14/2017 18:27   Dg Chest Port 1 View  Result Date: 07/16/2017 CLINICAL DATA:  Leukocytosis EXAM: PORTABLE CHEST 1 VIEW COMPARISON:  07/13/2017 FINDINGS: Cardiac shadow is accentuated by the portable technique but at the upper limits of normal in size. Aortic calcifications are again noted. Some diffuse opacities are again identified bilaterally slightly increased from the prior exam particularly in the bases. Degenerative changes of the thoracic spine are noted. IMPRESSION: Slight increase in basilar opacities bilaterally. Electronically Signed   By: Alcide Clever M.D.   On: 07/16/2017 11:47   Dg Chest Port 1 View  Result Date: 07/13/2017 CLINICAL DATA:  Pneumonia. EXAM: PORTABLE CHEST 1 VIEW COMPARISON:  07/12/2017 FINDINGS:  Enlarged cardiac silhouette. Calcific atherosclerotic disease and tortuosity of the aorta. There is no evidence of lobar airspace consolidation, pleural effusion or pneumothorax. Mild central peribronchial airspace opacities bilaterally. Stable posttraumatic changes of the left humerus with humeral prosthesis. Soft tissues are grossly normal. Residual contrast within the GI tract. IMPRESSION: Mild central peribronchial airspace opacities bilaterally on the background of bronchiectasis. These may represent bronchitic changes. No lobar consolidation. Electronically Signed   By: Ted Mcalpine M.D.   On: 07/13/2017 15:36   Dg Chest [facility] 1 View  Result  Date: 07/12/2017 CLINICAL DATA:  Shortness of breath, vomiting. EXAM: PORTABLE CHEST 1 VIEW COMPARISON:  03/11/2017. FINDINGS: Trachea is midline. Heart size is grossly stable. Thoracic aorta is calcified. Lungs are somewhat low in volume with perihilar airspace opacification, left greater than right. No definite pleural fluid. Left shoulder arthroplasty. IMPRESSION: 1. Perihilar airspace opacification may be due to edema or pneumonia. Aspiration is not excluded. 2.  Aortic atherosclerosis (ICD10-170.0). Electronically Signed   By: Leanna Battles M.D.   On: 07/12/2017 17:16   Dg Swallowing Func-speech Pathology  Result Date: 07/13/2017 Objective Swallowing Evaluation: Type of Study: MBS-Modified Barium Swallow Study  Patient Details Name: DARRIK RICHMAN MRN: [id number] Date of Birth: [birth year] Today's Date: 07/13/2017 Time: SLP Start Time (ACUTE ONLY): 1430 -SLP Stop Time (ACUTE ONLY): 1459 SLP Time Calculation (min) (ACUTE ONLY): 29 min Past Medical History: Past Medical History: Diagnosis Date . CAD (coronary artery disease)   s/p multiple PCIs in all 3 vessels with DES. Last PCI 2005 with overlapping Cypher stents in CFX bifurcation lesion . DM (diabetes mellitus), type 2 (HCC)  . Glaucoma  . Gout  . HTN (hypertension)   not under good control .  Hyperlipidemia  . Macular degeneration  . Obesity  . Osteoporosis  . Paroxysmal atrial fibrillation (HCC)  . Peptic ulcer disease  . Sleep apnea  . Spinal stenosis  Past Surgical History: Past Surgical History: Procedure Laterality Date . Bilateral carpal tunnel surgery   . CARDIAC CATHETERIZATION    x4-5, PCIs . Thyroglossal duct cyst removed in Jan or Feb 2005   . TURP VAPORIZATION   HPI: 84 yo male admitted with aspiration pna - nausea and vomiting with SOB. PMH of CVA, back pain, Parkinson's disease, dementia, vertebral fx, A fib, DM, CAD, gout. Pt is from a SNF. MBS ordered due to concerns pt may be aspirating. CXR showed perihilar opacities, concerning for possible aspiration.   Pt has had a prior MBS many years ago but results are not available.   No data recorded Assessment / Plan / Recommendation CHL IP CLINICAL IMPRESSIONS 07/13/2017 Clinical Impression Difficult view due to pt's positioning - therefore at times larynx, trachea was not visualized.  Pt presents with moderate oropharyngeal and cervical esophageal dysphagia with sensorimotor deficits.  Also suspect esophageal component to his dysphagia.  Patient with decreased bolus cohesion resulting in premature spillage into pharynx and oral residuals which pt will swallow - albeit can be significantly delayed.  Trace laryngeal penetration and trace aspiration of secretions mixed with barium noted due to decreased timing of laryngeal closure discoordination difficulties.  Pt did not sense trace penetration/aspiration.  Residuals in pharynx mixed with pharyngeal secretions and did not fully clear during study.  Chin tuck posture helpful to prevent penetration but uncertain if will consistently help.   Upon esophageal sweep, pt appeared with stasis - without awareness.  Barium tablet then given with pudding - appeared to lodge at aortic arch.  Liquids (thin barium and water) given to aid transiting resulted in backflow to proximal cervical esophagus without pt  sensation.  Suspect this issue is a large contributing factor to pt's possible aspiration.  Of note, pt is kyphotic this likely further compromises his esophageal clearance.  Recommend dys3/ground meats/thin with strict precautions.   SLP Visit Diagnosis Dysphagia, oropharyngeal phase (R13.12);Dysphagia, pharyngoesophageal phase (R13.14) Attention and concentration deficit following -- Frontal lobe and executive function deficit following -- Impact on safety and function Moderate aspiration risk   CHL IP TREATMENT RECOMMENDATION 07/13/2017 Treatment  Recommendations Therapy as outlined in treatment plan below   Prognosis 07/13/2017 Prognosis for Safe Diet Advancement Fair Barriers to Reach Goals Other (Comment) Barriers/Prognosis Comment -- CHL IP DIET RECOMMENDATION 07/13/2017 SLP Diet Recommendations Dysphagia 3 (Mech soft) solids;Thin liquid Liquid Administration via Straw Medication Administration Whole meds with puree Compensations Slow rate;Small sips/bites;Follow solids with liquid;Other (Comment);Chin tuck Postural Changes Seated upright at 90 degrees;Remain semi-upright after after feeds/meals (Comment)   CHL IP OTHER RECOMMENDATIONS 07/13/2017 Recommended Consults Consider esophageal assessment Oral Care Recommendations Oral care QID Other Recommendations Have oral suction available   CHL IP FOLLOW UP RECOMMENDATIONS 07/13/2017 Follow up Recommendations Skilled Nursing facility   [facility] IP FREQUENCY AND DURATION 07/13/2017 Speech Therapy Frequency (ACUTE ONLY) min 2x/week Treatment Duration 2 weeks      CHL IP ORAL PHASE 07/13/2017 Oral Phase Impaired Oral - Pudding Teaspoon -- Oral - Pudding Cup -- Oral - Honey Teaspoon -- Oral - Honey Cup -- Oral - Nectar Teaspoon Decreased bolus cohesion;Delayed oral transit;Piecemeal swallowing;Premature spillage Oral - Nectar Cup Decreased bolus cohesion;Reduced posterior propulsion;Piecemeal swallowing;Premature spillage Oral - Nectar Straw Decreased bolus cohesion;Reduced posterior  propulsion;Delayed oral transit;Piecemeal swallowing;Premature spillage Oral - Thin Teaspoon Decreased bolus cohesion;Reduced posterior propulsion;Lingual/palatal residue;Piecemeal swallowing;Premature spillage Oral - Thin Cup -- Oral - Thin Straw Decreased bolus cohesion;Weak lingual manipulation;Reduced posterior propulsion;Lingual/palatal residue;Piecemeal swallowing;Premature spillage Oral - Puree Decreased bolus cohesion;Weak lingual manipulation;Reduced posterior propulsion;Lingual/palatal residue;Piecemeal swallowing;Premature spillage Oral - Mech Soft Decreased bolus cohesion;Premature spillage;Weak lingual manipulation;Reduced posterior propulsion;Lingual/palatal residue Oral - Regular -- Oral - Multi-Consistency -- Oral - Pill Decreased bolus cohesion;Premature spillage;Weak lingual manipulation;Reduced posterior propulsion;Lingual/palatal residue Oral Phase - Comment --  CHL IP PHARYNGEAL PHASE 07/13/2017 Pharyngeal Phase Impaired Pharyngeal- Pudding Teaspoon -- Pharyngeal -- Pharyngeal- Pudding Cup -- Pharyngeal -- Pharyngeal- Honey Teaspoon -- Pharyngeal -- Pharyngeal- Honey Cup -- Pharyngeal -- Pharyngeal- Nectar Teaspoon Penetration/Aspiration before swallow;Reduced epiglottic inversion;Reduced laryngeal elevation;Reduced airway/laryngeal closure;Reduced tongue base retraction Pharyngeal Material enters airway, remains ABOVE vocal cords then ejected out Pharyngeal- Nectar Cup Penetration/Aspiration before swallow;Reduced epiglottic inversion;Reduced laryngeal elevation;Reduced airway/laryngeal closure;Reduced tongue base retraction;Pharyngeal residue - valleculae Pharyngeal Material enters airway, remains ABOVE vocal cords and not ejected out Pharyngeal- Nectar Straw Reduced laryngeal elevation;Reduced airway/laryngeal closure;Reduced tongue base retraction;Penetration/Aspiration during swallow Pharyngeal Material enters airway, remains ABOVE vocal cords and not ejected out Pharyngeal- Thin Teaspoon  Reduced laryngeal elevation;Reduced anterior laryngeal mobility;Reduced airway/laryngeal closure;Reduced tongue base retraction;Penetration/Aspiration before swallow Pharyngeal Material enters airway, remains ABOVE vocal cords then ejected out Pharyngeal- Thin Cup -- Pharyngeal -- Pharyngeal- Thin Straw Reduced epiglottic inversion;Reduced laryngeal elevation;Reduced airway/laryngeal closure;Reduced tongue base retraction;Penetration/Aspiration before swallow Pharyngeal Material enters airway, remains ABOVE vocal cords and not ejected out Pharyngeal- Puree Delayed swallow initiation-vallecula;Reduced tongue base retraction;Pharyngeal residue - valleculae;Pharyngeal residue - pyriform;Reduced epiglottic inversion Pharyngeal -- Pharyngeal- Mechanical Soft Pharyngeal residue - valleculae;Pharyngeal residue - pyriform;Reduced epiglottic inversion;Reduced tongue base retraction Pharyngeal -- Pharyngeal- Regular -- Pharyngeal -- Pharyngeal- Multi-consistency -- Pharyngeal -- Pharyngeal- Pill Delayed swallow initiation-vallecula Pharyngeal -- Pharyngeal Comment --  CHL IP CERVICAL ESOPHAGEAL PHASE 07/13/2017 Cervical Esophageal Phase Impaired Pudding Teaspoon -- Pudding Cup -- Honey Teaspoon -- Honey Cup -- Nectar Teaspoon Reduced cricopharyngeal relaxation Nectar Cup -- Nectar Straw Reduced cricopharyngeal relaxation Thin Teaspoon Reduced cricopharyngeal relaxation Thin Cup -- Thin Straw Reduced cricopharyngeal relaxation Puree Reduced cricopharyngeal relaxation Mechanical Soft Reduced cricopharyngeal relaxation Regular -- Multi-consistency -- Pill Reduced cricopharyngeal relaxation Cervical Esophageal Comment -- No flowsheet data found. Donavan Burnet, MS [facility] SLP [phone]  Subjective: Seen and examined at bedside and was more alert and awake this morning.  New that he was in the hospital.  Complained of chronic back pain at this time.  No chest pain or shortness of breath.  Per nursing had not had a bowel  movement 3 days.  Feels okay.  Deemed medically stable to go back to SNF facility with palliative care to follow  Discharge Exam: Vitals:   07/17/17 1100 07/17/17 1225  BP: 112/79 (!) 131/93  Pulse:  79  Resp:  16  Temp:  98.2 F (36.8 C)  SpO2:  98%   Vitals:   07/16/17 2056 07/17/17 0427 07/17/17 1100 07/17/17 1225  BP: (!) 145/104 (!) 158/102 112/79 (!) 131/93  Pulse: 80 75  79  Resp: 18 16  16   Temp: 97.9 F (36.6 C) 98 F (36.7 C)  98.2 F (36.8 C)  TempSrc: Axillary Axillary  Oral  SpO2: 100% 100%  98%  Weight:      Height:       General: Pt is awake and not in acute distress Cardiovascular: Irregularly Irregular, S1/S2 +, no rubs, no gallops Respiratory: Diminished bilaterally at the bases, no wheezing, no rhonchi Abdominal: Soft, mildly tender, ND, bowel sounds + Extremities: no edema, no cyanosis Neuro: Has a baseline resting tremor; Moves extremities independently  The results of significant diagnostics from this hospitalization (including imaging, microbiology, ancillary and laboratory) are listed below for reference.    Microbiology: Recent Results (from the past 240 hour(s))  MRSA PCR Screening     Status: None   Collection Time: 07/13/17  8:41 AM  Result Value Ref Range Status   MRSA by PCR NEGATIVE NEGATIVE Final    Comment:        The GeneXpert MRSA Assay (FDA approved for NASAL specimens only), is one component of a comprehensive MRSA colonization surveillance program. It is not intended to diagnose MRSA infection nor to guide or monitor treatment for MRSA infections. Performed at [facility], [address], [place], [place] [address]   Culture, Urine     Status: Abnormal   Collection Time: 07/16/17  3:14 AM  Result Value Ref Range Status   Specimen Description   Final    URINE, RANDOM Performed at [facility], [address], [place], [place] [address]    Special Requests   Final    NONE Performed  at [facility], [address] [place], [place] [address]    Culture (A)  Final    <10,000 COLONIES/mL INSIGNIFICANT GROWTH Performed at [facility], [address], [place], [place] [address]    Report Status 07/17/2017 FINAL  Final    Labs: BNP (last 3 results) Recent Labs    07/12/17 1726  BNP 553.0*   Basic Metabolic Panel: Recent Labs  Lab 07/13/17 0144 07/14/17 0536 07/15/17 1913 07/16/17 0452 07/17/17 0948  NA 138 140 139 142 141  K 3.9 3.1* 3.5 4.1 3.8  CL 105 109 108 105 104  CO2 23 23 22 26 26   GLUCOSE 194* 138* 191* 166* 161*  BUN 27* 23* 14 12 12   CREATININE 1.06 0.89 0.61 0.61 0.77  CALCIUM 8.4* 8.4* 8.6* 8.9 8.9  MG 1.4* 1.7 1.4* 1.5* 1.9  PHOS 4.1  --  2.4* 2.4* 3.5   Liver Function Tests: Recent Labs  Lab 07/12/17 1725 07/13/17 0144 07/15/17 1913 07/16/17 0452 07/17/17 0948  AST 29 26 45* 38 25  ALT  24 23 8* 10* 22  ALKPHOS 86 75 72 75 77  BILITOT 1.3* 1.5* 0.6 0.8 0.8  PROT 5.8* 5.5* 5.4* 5.6* 6.1*  ALBUMIN 2.7* 2.5* 2.3* 2.5* 2.6*   No results for input(s): LIPASE, AMYLASE in the last 168 hours. No results for input(s): AMMONIA in the last 168 hours. CBC: Recent Labs  Lab 07/12/17 1725 07/13/17 0144 07/14/17 0536 07/15/17 1913 07/16/17 0452 07/17/17 0948  WBC 16.1* 17.8* 8.9 10.6* 12.3* 13.2*  NEUTROABS 14.9*  --   --  7.9* 9.0* 10.4*  HGB 14.9 13.2 11.8* 12.8* 14.0 15.0  HCT 45.0 38.9* 35.7* 39.5 41.9 44.7  MCV 93.4 94.2 95.2 94.5 94.6 94.7  PLT 192 163 147* 179 167 189   Cardiac Enzymes: Recent Labs  Lab 07/12/17 1725 07/12/17 1953 07/13/17 0144 07/13/17 0841  TROPONINI <0.03 <0.03 <0.03 0.03*   BNP: Invalid input(s): POCBNP CBG: No results for input(s): GLUCAP in the last 168 hours. D-Dimer No results for input(s): DDIMER in the last 72 hours. Hgb A1c No results for input(s): HGBA1C in the last 72 hours. Lipid Profile No results for input(s): CHOL, HDL, LDLCALC, TRIG, CHOLHDL,  LDLDIRECT in the last 72 hours. Thyroid function studies No results for input(s): TSH, T4TOTAL, T3FREE, THYROIDAB in the last 72 hours.  Invalid input(s): FREET3 Anemia work up No results for input(s): VITAMINB12, FOLATE, FERRITIN, TIBC, IRON, RETICCTPCT in the last 72 hours. Urinalysis    Component Value Date/Time   COLORURINE YELLOW 07/14/2017 0909   APPEARANCEUR HAZY (A) 07/14/2017 0909   LABSPEC 1.023 07/14/2017 0909   PHURINE 5.0 07/14/2017 0909   GLUCOSEU NEGATIVE 07/14/2017 0909   HGBUR MODERATE (A) 07/14/2017 0909   BILIRUBINUR NEGATIVE 07/14/2017 0909   KETONESUR NEGATIVE 07/14/2017 0909   PROTEINUR NEGATIVE 07/14/2017 0909   UROBILINOGEN 0.2 10/23/2014 1120   NITRITE NEGATIVE 07/14/2017 0909   LEUKOCYTESUR MODERATE (A) 07/14/2017 0909   Sepsis Labs Invalid input(s): PROCALCITONIN,  WBC,  LACTICIDVEN Microbiology Recent Results (from the past 240 hour(s))  MRSA PCR Screening     Status: None   Collection Time: 07/13/17  8:41 AM  Result Value Ref Range Status   MRSA by PCR NEGATIVE NEGATIVE Final    Comment:        The GeneXpert MRSA Assay (FDA approved for NASAL specimens only), is one component of a comprehensive MRSA colonization surveillance program. It is not intended to diagnose MRSA infection nor to guide or monitor treatment for MRSA infections. Performed at [facility], [address] [place], [place] [address]   Culture, Urine     Status: Abnormal   Collection Time: 07/16/17  3:14 AM  Result Value Ref Range Status   Specimen Description   Final    URINE, RANDOM Performed at [facility], [address] [place], [place] [address]    Special Requests   Final    NONE Performed at [facility], [address] [place], [place] [address]    Culture (A)  Final    <10,000 COLONIES/mL INSIGNIFICANT GROWTH Performed at [facility], [address] [place], [place] [address]    Report  Status 07/17/2017 FINAL  Final   Time coordinating discharge: 35 minutes  SIGNED:  Merlene Laughter, DO [facility] 07/17/2017, 12:32 PM Pager [phone]  If 7PM-7AM, please contact night-coverage [email or web address] Password [email or web address]

## 2017-07-31 ENCOUNTER — Non-Acute Institutional Stay: Payer: Medicare Other | Admitting: Internal Medicine

## 2017-07-31 DIAGNOSIS — G8929 Other chronic pain: Secondary | ICD-10-CM

## 2017-07-31 DIAGNOSIS — R531 Weakness: Secondary | ICD-10-CM

## 2017-07-31 DIAGNOSIS — M546 Pain in thoracic spine: Principal | ICD-10-CM

## 2017-07-31 NOTE — Progress Notes (Signed)
PALLIATIVE CARE CONSULT VISIT   PATIENT NAME: Darryl Carroll DOB: [birth year] MRN: [id number]  PRIMARY CARE PROVIDER:   Dr. Osie Bond PROVIDER:    Cherlyn Roberts, NP- [place]  RESPONSIBLE PARTY:   [name](wife) [phone]   RECOMMENDATIONS and PLAN:  1.  Chronic midline thoracic back pain  M54.6:  MRI reviewed.  Compression fx T11, L1, L2; L2-3, L4-5 neural foraminal stenosis.  Pain in spite of oxycodon.  Begin Diclofenac gel application BID.  Continue to monitor.  2. Weakness:  Chronic and multifactorial including advanced Parkinson disease and weight loss.  Continue nutritional support with Ensure 2-3x/day and protein supplements.  Weigh weekly.  3. Advanced care planning.  DNR. Plan on additional discussion with wife to review goals of care.  Son is present during today's visit.    I spent 45 minutes providing this consultation at [facility],  from 4:00pm to 4:45pm. More than 50% of the time in this consultation was spent coordinating communication with patient, son and facility staff.  HISTORY OF PRESENT ILLNESS:  Darryl Carroll is a 84 y.o. year old male with multiple medical problems including Parkinson disease, dementia with behavioral disturbances, afib on anti-coagulants and chronic back pain.  Staff and patient report increased back pain and weakness.  He requires assistance in transferring and is incontinent. He was hospitalized from 5/5-5/10/19 due to weakness and was diagnosed with a UTI.  He also has lost weight from 130 to 113#. Palliative Care was asked to help address goals of care and symptom management.   CODE STATUS: DNR  PPS: 40%  In wheelchair for meals HOSPICE ELIGIBILITY/DIAGNOSIS: TBD     PAST MEDICAL HISTORY:  Past Medical History:  Diagnosis Date  . CAD (coronary artery disease)    s/p multiple PCIs in all 3 vessels with DES. Last PCI 2005 with overlapping Cypher stents in CFX bifurcation lesion  . DM (diabetes mellitus), type 2  (HCC)   . Glaucoma   . Gout   . HTN (hypertension)    not under good control  . Hyperlipidemia   . Macular degeneration   . Obesity   . Osteoporosis   . Paroxysmal atrial fibrillation (HCC)   . Peptic ulcer disease   . Sleep apnea   . Spinal stenosis     SOCIAL HX:  Social History   Tobacco Use  . Smoking status: Former Smoker    Types: Cigarettes    Last attempt to quit: 10/03/1983    Years since quitting: 33.8  . Smokeless tobacco: Never Used  Substance Use Topics  . Alcohol use: No    Alcohol/week: 0.0 oz    ALLERGIES:  Allergies  Allergen Reactions  . Contrast Media [Iodinated Diagnostic Agents] Other (See Comments)    Reaction when stints put in  . Other     Bee stings  . Zolpidem Other (See Comments)    unknown     PERTINENT MEDICATIONS:  Outpatient Encounter Medications as of 07/31/2017  Medication Sig  . acetaminophen (TYLENOL) 325 MG tablet Take 650 mg by mouth every 6 (six) hours.  Marland Kitchen acyclovir (ZOVIRAX) 400 MG tablet Take 400 mg by mouth 2 (two) times daily.  Marland Kitchen albuterol (PROVENTIL) (2.5 MG/3ML) 0.083% nebulizer solution Take 3 mLs (2.5 mg total) by nebulization every 2 (two) hours as needed for wheezing.  Marland Kitchen alum & mag hydroxide-simeth (MAALOX/MYLANTA) 200-200-20 MG/5ML suspension Take 30 mLs by mouth every 4 (four) hours as needed for indigestion.  Marland Kitchen atenolol (TENORMIN) 100  MG tablet Take 1 tablet (100 mg total) by mouth daily.  Marland Kitchen atorvastatin (LIPITOR) 40 MG tablet Take 1 tablet (40 mg total) by mouth daily at 6 PM.  . carbidopa-levodopa (SINEMET IR) 25-100 MG tablet Take 1 tablet by mouth 3 (three) times daily.  . Cholecalciferol (VITAMIN D-3) 1000 units CAPS Take 1,000 Units by mouth daily.  Marland Kitchen diltiazem (CARDIZEM CD) 120 MG 24 hr capsule Take 120 mg by mouth daily.  . feeding supplement, ENSURE ENLIVE, (ENSURE ENLIVE) LIQD Take 237 mLs by mouth 2 (two) times daily between meals.  . Melatonin 3 MG TABS Take 3 mg by mouth at bedtime.  . metFORMIN  (GLUCOPHAGE) 500 MG tablet Take 500 mg by mouth daily with breakfast.  . Multiple Vitamins-Minerals (MULTIVITAMIN WITH MINERALS) tablet Take 1 tablet by mouth daily.  . polyethylene glycol (MIRALAX / GLYCOLAX) packet Take 17 g by mouth daily as needed.  . Probiotic Product (RISA-BID PROBIOTIC PO) Take by mouth.  . Rivaroxaban (XARELTO) 15 MG TABS tablet Take 15 mg by mouth at bedtime.   . senna-docusate (SENOKOT-S) 8.6-50 MG tablet Take 1 tablet by mouth at bedtime.  . sertraline (ZOLOFT) 100 MG tablet Take 100 mg by mouth daily.   . traMADol (ULTRAM) 50 MG tablet Take 1 tablet (50 mg total) by mouth every 8 (eight) hours as needed for moderate pain.   No facility-administered encounter medications on file as of 07/31/2017.     PHYSICAL EXAM:   General: NAD, frail appearing, thin.  Partially reclining in bed Cardiovascular: regular rate and rhythm Pulmonary: clear ant fields Abdomen: soft, nontender, + bowel sounds GU: no suprapubic tenderness Extremities: no edema.  Little muscle mass MS:  Kyphotic and completely flexes at waist in sitting position. Skin: exposed skin is intact Neurological: Generalized weakness. Somnolent but arouses easily.  Oriented to person. Speaks in sentences and follows instructions. Psych:  Calm and cooperative.  Margaretha Sheffield, NP

## 2017-08-07 DIAGNOSIS — M546 Pain in thoracic spine: Principal | ICD-10-CM

## 2017-08-07 DIAGNOSIS — G8929 Other chronic pain: Secondary | ICD-10-CM | POA: Insufficient documentation

## 2017-08-10 ENCOUNTER — Non-Acute Institutional Stay: Payer: Medicare Other | Admitting: Internal Medicine

## 2017-08-10 DIAGNOSIS — G20 Parkinson's disease: Secondary | ICD-10-CM

## 2017-08-10 DIAGNOSIS — M546 Pain in thoracic spine: Principal | ICD-10-CM

## 2017-08-10 DIAGNOSIS — F0280 Dementia in other diseases classified elsewhere without behavioral disturbance: Secondary | ICD-10-CM

## 2017-08-10 DIAGNOSIS — G8929 Other chronic pain: Secondary | ICD-10-CM

## 2017-08-10 DIAGNOSIS — R531 Weakness: Secondary | ICD-10-CM

## 2017-08-12 NOTE — Progress Notes (Signed)
PALLIATIVE CARE CONSULT VISIT   PATIENT NAME: Darryl Carroll DOB: 05/02/1927 MRN: [id number]  PRIMARY CARE PROVIDER:   Merlene LaughterStoneking, Hal, MD  REFERRING PROVIDER:      Cherlyn RobertsAdelah Kooshki, NP/ Dr. Carren RangPatel [facility]   RESPONSIBLE PARTY:         RECOMMENDATIONS and PLAN:   1.  Chronic midline thoracic back pain  M54.6:  Improvement of pain with adjustment of Oxycodone ER by NP.  2.  Weakness R53.1:  Unchanged  3:  Dementia due to Parkinson's disease without behavioral disturbance  G20:  FAST stage 7c and speaks in brief sentences.  Increased functional decline.  He will require continued support and monitoring of cognitive decline.  [facility],  From 2:30pm  to 2:45pm. More than 50% of the time in this consultation was spent coordinating communication.   HISTORY OF PRESENT ILLNESS: Follow-up with Darryl Carroll.  Pt. Reports improvement of chronic back pain.  He continues to have overall weakness and requires total care assistance.  CODE STATUS: DNR  PPS: 30% HOSPICE ELIGIBILITY/DIAGNOSIS: TBD  PAST MEDICAL HISTORY:  Past Medical History:  Diagnosis Date  . CAD (coronary artery disease)    s/p multiple PCIs in all 3 vessels with DES. Last PCI 2005 with overlapping Cypher stents in CFX bifurcation lesion  . DM (diabetes mellitus), type 2 (HCC)   . Glaucoma   . Gout   . HTN (hypertension)    not under good control  . Hyperlipidemia   . Macular degeneration   . Obesity   . Osteoporosis   . Paroxysmal atrial fibrillation (HCC)   . Peptic ulcer disease   . Sleep apnea   . Spinal stenosis     SOCIAL HX:  Social History   Tobacco Use  . Smoking status: Former Smoker    Types: Cigarettes    Last attempt to quit: 10/03/1983    Years since quitting: 33.8  . Smokeless tobacco: Never Used  Substance Use Topics  . Alcohol use: No    Alcohol/week: 0.0 oz    ALLERGIES:  Allergies  Allergen Reactions  . Contrast Media  [Iodinated Diagnostic Agents] Other (See Comments)    Reaction when stints put in  . Other     Bee stings  . Zolpidem Other (See Comments)    unknown     PERTINENT MEDICATIONS:  Outpatient Encounter Medications as of 08/10/2017  Medication Sig  . acetaminophen (TYLENOL) 325 MG tablet Take 650 mg by mouth every 6 (six) hours.  Marland Kitchen. acyclovir (ZOVIRAX) 400 MG tablet Take 400 mg by mouth 2 (two) times daily.  Marland Kitchen. albuterol (PROVENTIL) (2.5 MG/3ML) 0.083% nebulizer solution Take 3 mLs (2.5 mg total) by nebulization every 2 (two) hours as needed for wheezing.  Marland Kitchen. alum & mag hydroxide-simeth (MAALOX/MYLANTA) 200-200-20 MG/5ML suspension Take 30 mLs by mouth every 4 (four) hours as needed for indigestion.  Marland Kitchen. atenolol (TENORMIN) 100 MG tablet Take 1 tablet (100 mg total) by mouth daily.  Marland Kitchen. atorvastatin (LIPITOR) 40 MG tablet Take 1 tablet (40 mg total) by mouth daily at 6 PM.  . carbidopa-levodopa (SINEMET IR) 25-100 MG tablet Take 1 tablet by mouth 3 (three) times daily.  . Cholecalciferol (VITAMIN D-3) 1000 units CAPS Take 1,000 Units by mouth daily.  Marland Kitchen. diltiazem (CARDIZEM CD) 120 MG 24 hr capsule Take 120 mg by mouth daily.  . feeding supplement, ENSURE ENLIVE, (ENSURE ENLIVE) LIQD Take 237 mLs by mouth 2 (two) times  daily between meals.  . Melatonin 3 MG TABS Take 3 mg by mouth at bedtime.  . metFORMIN (GLUCOPHAGE) 500 MG tablet Take 500 mg by mouth daily with breakfast.  . Multiple Vitamins-Minerals (MULTIVITAMIN WITH MINERALS) tablet Take 1 tablet by mouth daily.  . polyethylene glycol (MIRALAX / GLYCOLAX) packet Take 17 g by mouth daily as needed.  . Probiotic Product (RISA-BID PROBIOTIC PO) Take by mouth.  . Rivaroxaban (XARELTO) 15 MG TABS tablet Take 15 mg by mouth at bedtime.   . senna-docusate (SENOKOT-S) 8.6-50 MG tablet Take 1 tablet by mouth at bedtime.  . sertraline (ZOLOFT) 100 MG tablet Take 100 mg by mouth daily.   . traMADol (ULTRAM) 50 MG tablet Take 1 tablet (50 mg total) by mouth  every 8 (eight) hours as needed for moderate pain.   No facility-administered encounter medications on file as of 08/10/2017.     PHYSICAL EXAM:   General:  Frail appearing, thin elderly male reclining across bed.  In NAD.  Cardiovascular: regular rate and rhythm Pulmonary: clear ant fields Abdomen: soft, nontender, + bowel sounds GU: no suprapubic tenderness MS:  Tenderness of distal thoracic/proximal lumbar region with complete flexion of spine while sitting up Extremities: no edema or ecchymosis Skin: Exposed skin is intact Neurological: Alert, oriented to person. Weakness  Psych:  Mood is appropriate, cooperative  Margaretha Sheffield, NP-C

## 2017-09-02 ENCOUNTER — Non-Acute Institutional Stay: Payer: Medicare Other | Admitting: Internal Medicine

## 2017-09-10 DIAGNOSIS — F05 Delirium due to known physiological condition: Secondary | ICD-10-CM | POA: Insufficient documentation

## 2017-09-10 DIAGNOSIS — Z515 Encounter for palliative care: Secondary | ICD-10-CM | POA: Insufficient documentation

## 2017-09-10 DIAGNOSIS — R34 Anuria and oliguria: Secondary | ICD-10-CM | POA: Insufficient documentation

## 2017-09-10 NOTE — Progress Notes (Signed)
PALLIATIVE CARE CONSULT VISIT   PATIENT NAME: Darryl Carroll DOB: 01/04/1928 MRN: [id number]  PRIMARY CARE PROVIDER:   Merlene LaughterStoneking, Hal, MD  REFERRING PROVIDER:      Cherlyn RobertsAdelah Kooshki, NP/ Dr. Carren RangPatel [facility]  RESPONSIBLE PARTY:   Johnny BridgeMartha Takeshita(wife) [phone]   RECOMMENDATIONS and PLAN:   1.  Delirium superimposed on dementia F05:  R/O metabolic, medication or psychiatric cause.  Evaluate chemistries and UA.  Wean IR Oxycodone.  Behavioral health consult.  2. Anuria  R34:  New onset today:  Consider fluid retention, dehydration, RF.  Insert foley catheter, UA.  Attempt improvement of oral hydration  3:  Dementia due to Parkinson's disease with behavioral disturbance  G20:  FAST stage 7c .  Increased functional and cognitive decline. Hospice appropriate.   Discuss with wife.  4.  Weakness R53.1:  Increased decline with progression of Parkinson disease and dementia.  Not responding to dietary support.  Comfort care recommended.  5.   Palliative care encounter  Z51.5:  DNR status.  Discussion with wife today realated to goals of care and advanced care planning.  She expressed understanding that pt has declined overall and wants for him "what the medical staff feels is necessary".  "She does not want him to suffer".  Hospice care services were recommended to wife. She will discuss with son and notify facility NP of decision.  Palliative care will follow for now.  .[facility],  From 1:30pm  to 2:45pm. More than 50% of the time in this consultation was spent coordinating communication.   HISTORY OF PRESENT ILLNESS: Follow-up with Darryl MacBobby W Tomey. Clinical staff report that pt has become more agitated and confused over last several days with increased falls and decreased nutritional intake.  He refuses medication and requires constant supervision with total care.  Use of Fentanyl patch and Oxycodone for chronic back pain. CNA reports no  urinary output today.   CODE STATUS: DNAR  PPS: 20% HOSPICE ELIGIBILITY/DIAGNOSIS: YES/ Dementia due to advnced Parkinson disease with behavioral disturbance  PAST MEDICAL HISTORY:  Past Medical History:  Diagnosis Date  . CAD (coronary artery disease)    s/p multiple PCIs in all 3 vessels with DES. Last PCI 2005 with overlapping Cypher stents in CFX bifurcation lesion  . DM (diabetes mellitus), type 2 (HCC)   . Glaucoma   . Gout   . HTN (hypertension)    not under good control  . Hyperlipidemia   . Macular degeneration   . Obesity   . Osteoporosis   . Paroxysmal atrial fibrillation (HCC)   . Peptic ulcer disease   . Sleep apnea   . Spinal stenosis     SOCIAL HX:  Social History   Tobacco Use  . Smoking status: Former Smoker    Types: Cigarettes    Last attempt to quit: 10/03/1983    Years since quitting: 33.8  . Smokeless tobacco: Never Used  Substance Use Topics  . Alcohol use: No    Alcohol/week: 0.0 oz    ALLERGIES:  Allergies  Allergen Reactions  . Contrast Media [Iodinated Diagnostic Agents] Other (See Comments)    Reaction when stints put in  . Other     Bee stings  . Zolpidem Other (See Comments)    unknown     PERTINENT MEDICATIONS:  Outpatient Encounter Medications as of 08/10/2017  Medication Sig  . acetaminophen (TYLENOL) 325 MG tablet Take 650 mg by mouth every 6 (six) hours.  .Marland Kitchen  acyclovir (ZOVIRAX) 400 MG tablet Take 400 mg by mouth 2 (two) times daily.  Marland Kitchen albuterol (PROVENTIL) (2.5 MG/3ML) 0.083% nebulizer solution Take 3 mLs (2.5 mg total) by nebulization every 2 (two) hours as needed for wheezing.  Marland Kitchen alum & mag hydroxide-simeth (MAALOX/MYLANTA) 200-200-20 MG/5ML suspension Take 30 mLs by mouth every 4 (four) hours as needed for indigestion.  Marland Kitchen atenolol (TENORMIN) 100 MG tablet Take 1 tablet (100 mg total) by mouth daily.  Marland Kitchen atorvastatin (LIPITOR) 40 MG tablet Take 1 tablet (40 mg total) by mouth daily at 6 PM.  . carbidopa-levodopa (SINEMET IR)  25-100 MG tablet Take 1 tablet by mouth 3 (three) times daily.  . Cholecalciferol (VITAMIN D-3) 1000 units CAPS Take 1,000 Units by mouth daily.  Marland Kitchen diltiazem (CARDIZEM CD) 120 MG 24 hr capsule Take 120 mg by mouth daily.  . feeding supplement, ENSURE ENLIVE, (ENSURE ENLIVE) LIQD Take 237 mLs by mouth 2 (two) times daily between meals.  . Melatonin 3 MG TABS Take 3 mg by mouth at bedtime.  . metFORMIN (GLUCOPHAGE) 500 MG tablet Take 500 mg by mouth daily with breakfast.  . Multiple Vitamins-Minerals (MULTIVITAMIN WITH MINERALS) tablet Take 1 tablet by mouth daily.  . polyethylene glycol (MIRALAX / GLYCOLAX) packet Take 17 g by mouth daily as needed.  . Probiotic Product (RISA-BID PROBIOTIC PO) Take by mouth.  . Rivaroxaban (XARELTO) 15 MG TABS tablet Take 15 mg by mouth at bedtime.   . senna-docusate (SENOKOT-S) 8.6-50 MG tablet Take 1 tablet by mouth at bedtime.  . sertraline (ZOLOFT) 100 MG tablet Take 100 mg by mouth daily.   . traMADol (ULTRAM) 50 MG tablet Take 1 tablet (50 mg total) by mouth every 8 (eight) hours as needed for moderate pain.   No facility-administered encounter medications on file as of 08/10/2017.     PHYSICAL EXAM:   General:  Chronically ill and frail appearing, thin elderly male.  Slumping in wheelchair  ENT:  Dry mucus membranes.  Cardiovascular: regular rate and rhythm Pulmonary: clear ant fields Abdomen: soft, nontender, hypoactive bowel sounds GU: no suprapubic tenderness or distention MS:  Lumbar brace in place. flexion of spine while sitting up Extremities: no edema or ecchymosis Skin: Exposed skin is intact  Poor skin turgor Neurological: Alert, oriented to wife only. Generalized weakness  Psych:  Agitated mood,uncooperative  Margaretha Sheffield, NP-C

## 2017-10-08 DEATH — deceased

## 2019-05-14 IMAGING — CT CT HEAD CODE STROKE
3 series · 15 of 47 positions shown, 18 images · non-contrast
Comparison: 09/22/2014

CLINICAL DATA: Code stroke.  Right-sided weakness.  Leftward gaze.

EXAM:
CT HEAD WITHOUT CONTRAST
TECHNIQUE: Contiguous axial images were obtained from the base of the skull
through the vertex without intravenous contrast.

[Series 3: head 5.0 st · axial · 0.46mm/px · z∈[-54,+86]mm · 9 of 34 slices shown, 12 images]
[im 3/34  brain]
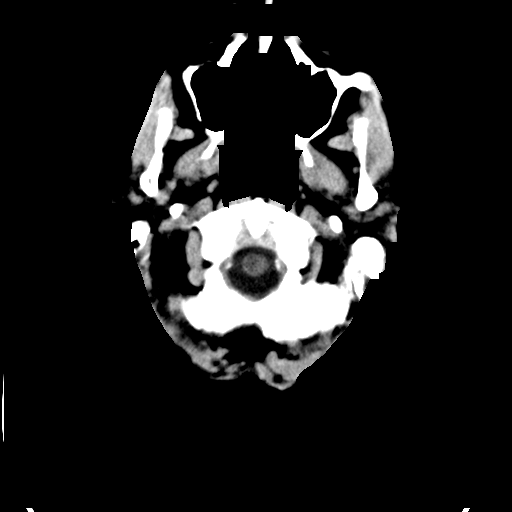
[im 3/34  bone]
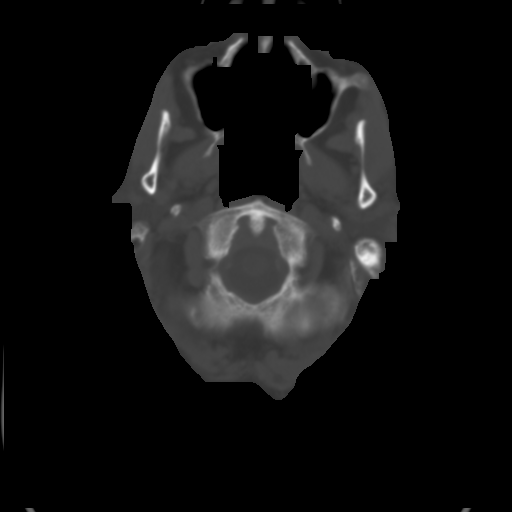
[im 6/34  brain]
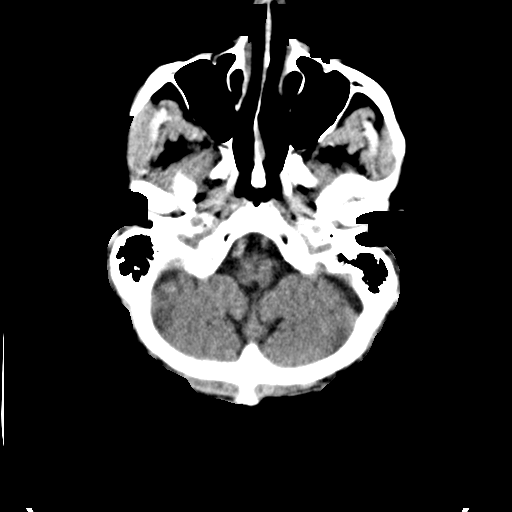
[im 10/34  brain]
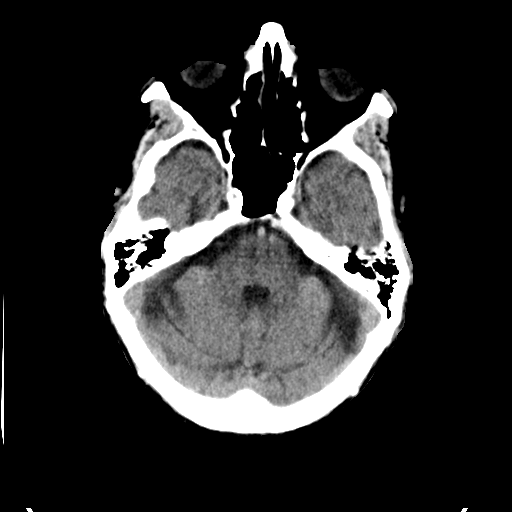
[im 13/34  brain]
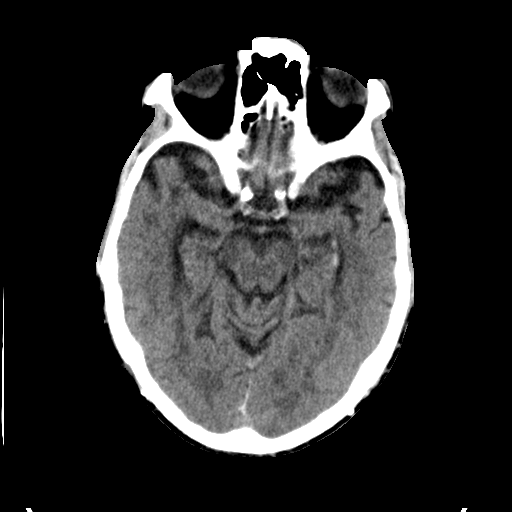
[im 18/34  brain]
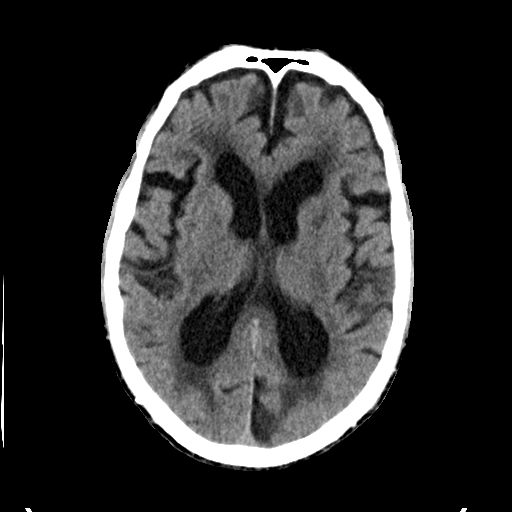
[im 18/34  bone]
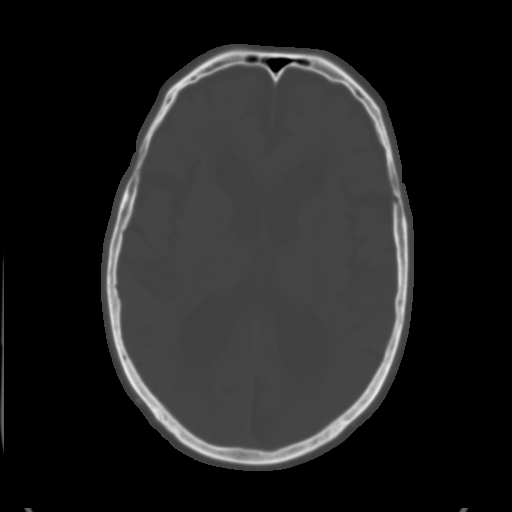
[im 21/34  brain]
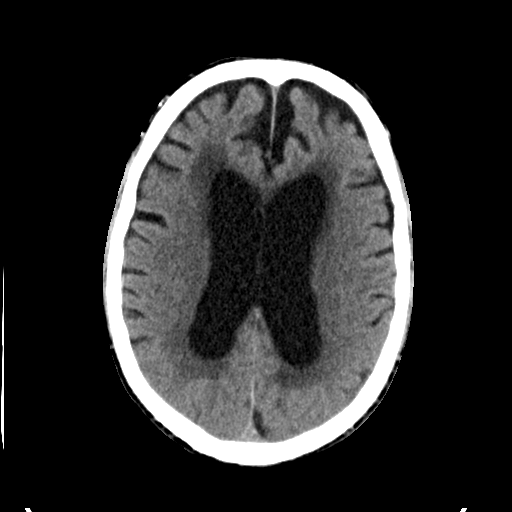
[im 24/34  brain]
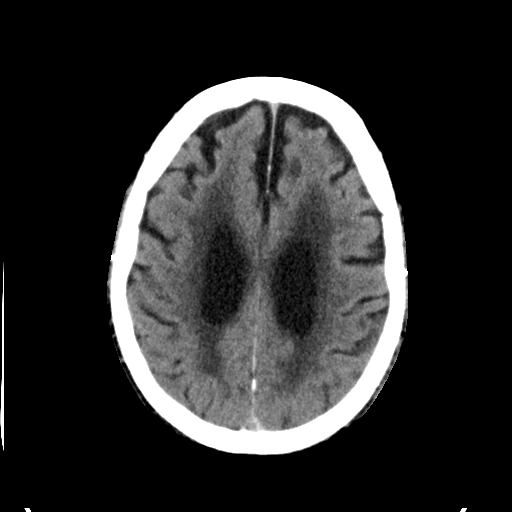
[im 28/34  brain]
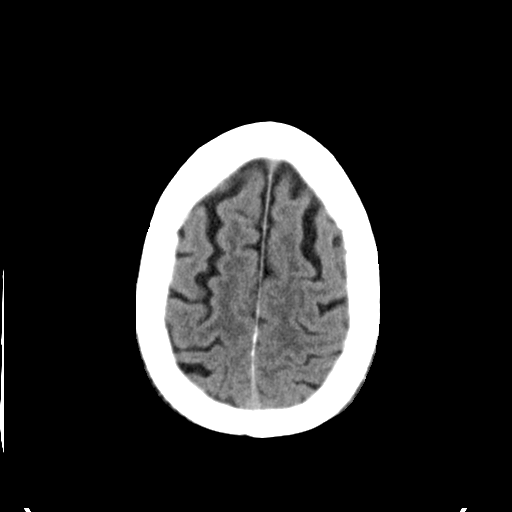
[im 31/34  brain]
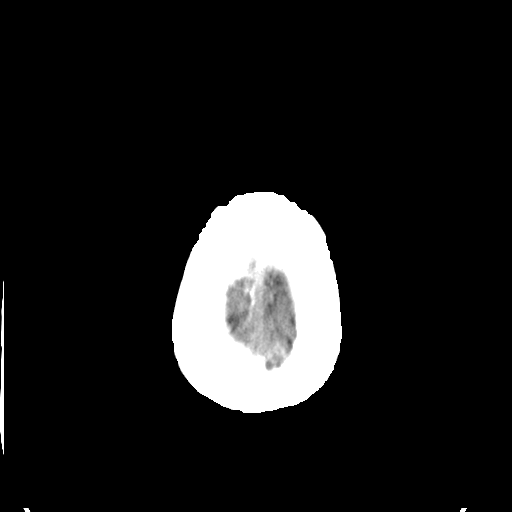
[im 31/34  bone]
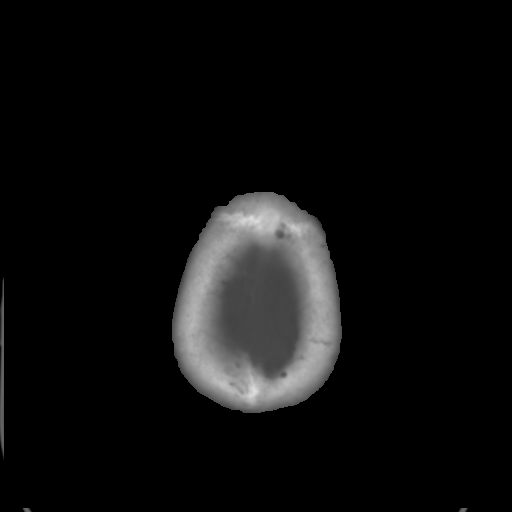

[Series 5: head 3.0 cor st · coronal · 0.33mm/px · 3 of 71 slices shown]
[im 24/71  brain]
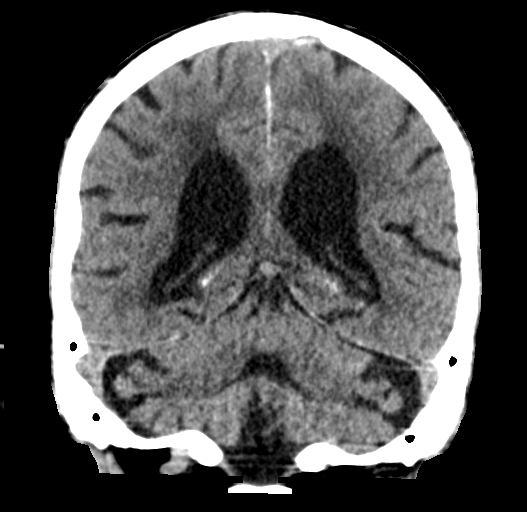
[im 32/71  brain]
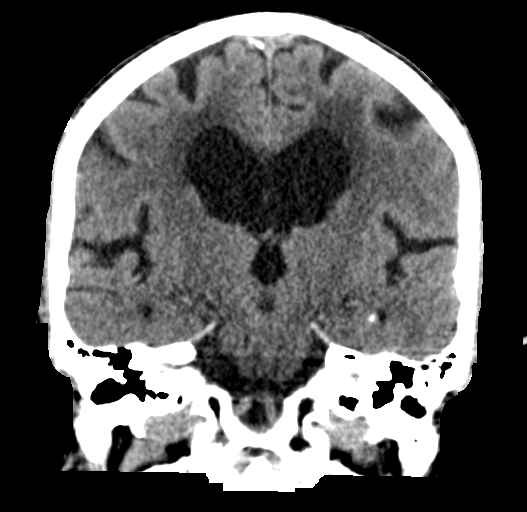
[im 39/71  brain]
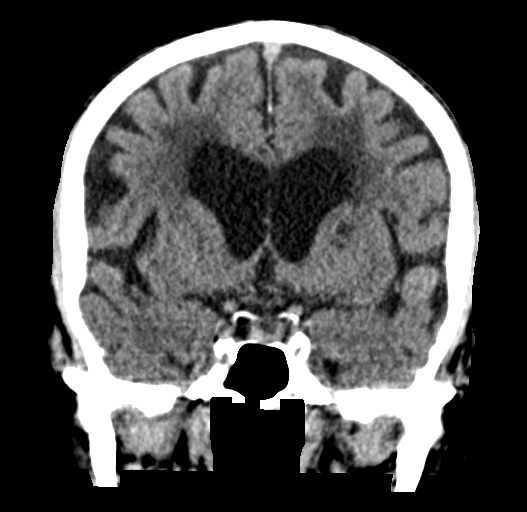

[Series 6: head 3.0 sag st · sagittal · 0.33mm/px · 3 of 49 slices shown]
[im 17/49  brain]
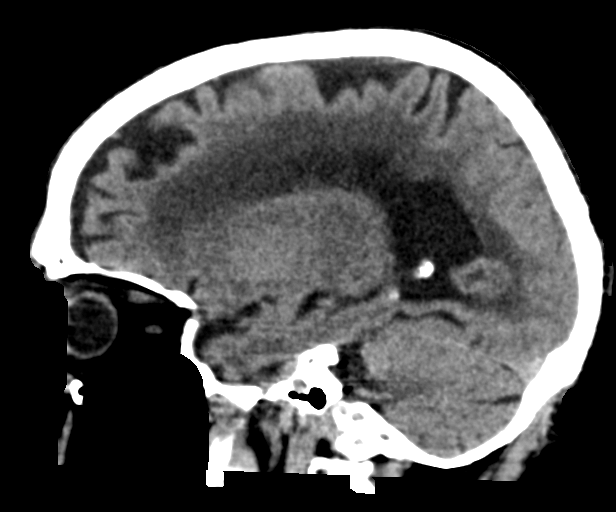
[im 25/49  brain]
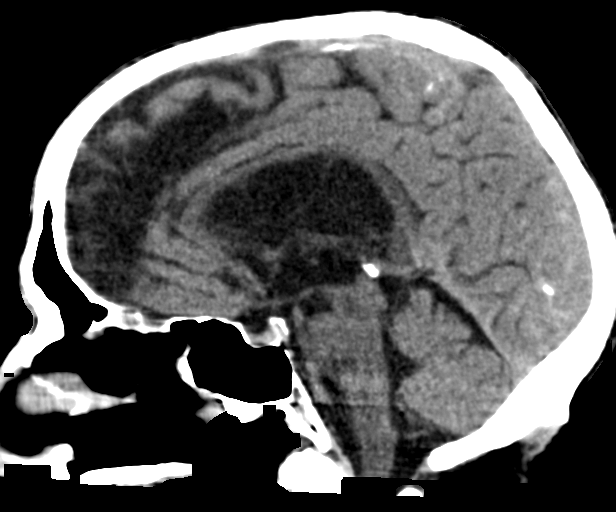
[im 33/49  brain]
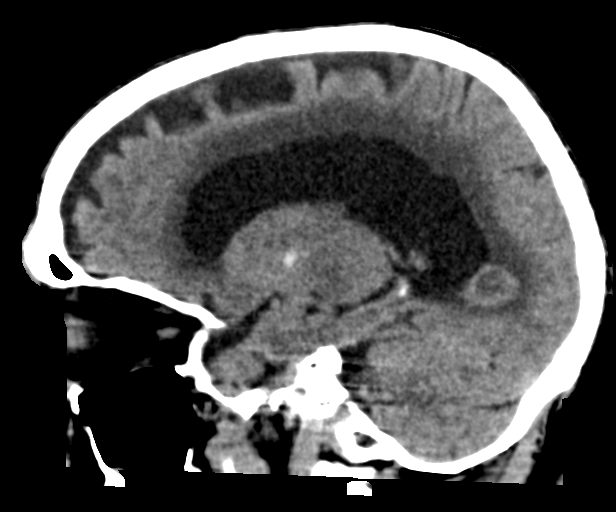

[15 of 47 positions shown; findings below may reference images not displayed]

FINDINGS: Brain: There is a new infarct in the medial left occipital lobe
which is well-defined with some enlargement of the adjacent
extra-axial CSF spaces most compatible with an old infarct. Mild
lateral and third ventriculomegaly is unchanged and may reflect
central predominant cerebral atrophy. Patchy to confluent
hypodensities in the subcortical and deep cerebral white matter have
progressed and are nonspecific but compatible with moderately
advanced chronic small vessel ischemic disease. There is a new
lacunar infarct in the anterior limb of the left internal capsule
which is relatively well-defined. No definite acute cortically based
infarct, intracranial hemorrhage, mass, midline shift, or
extra-axial fluid collection is identified.

Vascular: Calcified atherosclerosis at the skullbase. No hyperdense
vessel.

Skull: No fracture or focal osseous lesion.

Sinuses/Orbits: Small left maxillary sinus mucous retention cyst.
Clear mastoid air cells. Bilateral cataract extraction.

Other: None.

ASPECTS (Alberta Stroke Program Early CT Score)

- Ganglionic level infarction (caudate, lentiform nuclei, internal
capsule, insula, M1-M3 cortex): 7

- Supraganglionic infarction (M4-M6 cortex): 3

Total score (0-10 with 10 being normal): 10
IMPRESSION: 1. No acute intracranial hemorrhage or definite acute infarct.
2. New infarcts in the left occipital lobe and left internal
capsule, both felt most likely to be chronic.
3. Moderately advanced chronic small vessel ischemic disease,
progressed from 1844.
4. ASPECTS is 10 (considering the left internal capsule infarct as
chronic).
These results were communicated to Dr. Mayabel at [DATE] pmon 03/11/2017
by text page via the [employer] messaging system.
